# Patient Record
Sex: Female | Born: 1965 | Race: White | Hispanic: No | Smoking: Current some day smoker
Health system: Southern US, Community
[De-identification: ages and names within clinical notes are randomized; demographics above are authoritative.]

## PROBLEM LIST (undated history)

## (undated) DIAGNOSIS — K219 Gastro-esophageal reflux disease without esophagitis: Secondary | ICD-10-CM

## (undated) DIAGNOSIS — K760 Fatty (change of) liver, not elsewhere classified: Secondary | ICD-10-CM

## (undated) DIAGNOSIS — M1990 Unspecified osteoarthritis, unspecified site: Secondary | ICD-10-CM

## (undated) DIAGNOSIS — J449 Chronic obstructive pulmonary disease, unspecified: Secondary | ICD-10-CM

## (undated) DIAGNOSIS — I10 Essential (primary) hypertension: Secondary | ICD-10-CM

## (undated) DIAGNOSIS — K7460 Unspecified cirrhosis of liver: Secondary | ICD-10-CM

## (undated) HISTORY — DX: Unspecified osteoarthritis, unspecified site: M19.90

## (undated) HISTORY — PX: CHOLECYSTECTOMY: SHX55

## (undated) MED FILL — Medication: Fill #0 | Status: CN

---

## 1999-09-04 ENCOUNTER — Encounter: Payer: Self-pay | Admitting: Emergency Medicine

## 1999-09-04 ENCOUNTER — Emergency Department (HOSPITAL_COMMUNITY): Admission: EM | Admit: 1999-09-04 | Discharge: 1999-09-04 | Payer: Self-pay | Admitting: Emergency Medicine

## 2002-07-11 ENCOUNTER — Encounter: Payer: Self-pay | Admitting: Emergency Medicine

## 2002-07-11 ENCOUNTER — Inpatient Hospital Stay (HOSPITAL_COMMUNITY): Admission: EM | Admit: 2002-07-11 | Discharge: 2002-07-12 | Payer: Self-pay | Admitting: Emergency Medicine

## 2002-11-06 ENCOUNTER — Emergency Department (HOSPITAL_COMMUNITY): Admission: EM | Admit: 2002-11-06 | Discharge: 2002-11-07 | Payer: Self-pay | Admitting: Emergency Medicine

## 2002-11-06 ENCOUNTER — Encounter: Payer: Self-pay | Admitting: Emergency Medicine

## 2004-10-03 ENCOUNTER — Other Ambulatory Visit: Payer: Self-pay

## 2004-10-03 ENCOUNTER — Emergency Department: Payer: Self-pay | Admitting: Emergency Medicine

## 2008-12-04 ENCOUNTER — Emergency Department: Payer: Self-pay | Admitting: Internal Medicine

## 2010-06-14 ENCOUNTER — Emergency Department: Payer: Self-pay | Admitting: Unknown Physician Specialty

## 2011-04-14 ENCOUNTER — Emergency Department: Payer: Self-pay | Admitting: Emergency Medicine

## 2012-12-09 ENCOUNTER — Emergency Department: Payer: Self-pay | Admitting: Emergency Medicine

## 2013-03-09 DIAGNOSIS — B1920 Unspecified viral hepatitis C without hepatic coma: Secondary | ICD-10-CM | POA: Insufficient documentation

## 2013-12-29 ENCOUNTER — Emergency Department: Payer: Self-pay | Admitting: Emergency Medicine

## 2014-10-19 ENCOUNTER — Emergency Department
Admission: EM | Admit: 2014-10-19 | Discharge: 2014-10-19 | Disposition: A | Discharge status: Home / Self Care | Payer: Self-pay | Attending: Emergency Medicine | Admitting: Emergency Medicine

## 2014-10-19 ENCOUNTER — Encounter: Payer: Self-pay | Admitting: Emergency Medicine

## 2014-10-19 DIAGNOSIS — M5441 Lumbago with sciatica, right side: Secondary | ICD-10-CM | POA: Insufficient documentation

## 2014-10-19 DIAGNOSIS — R1031 Right lower quadrant pain: Secondary | ICD-10-CM | POA: Insufficient documentation

## 2014-10-19 DIAGNOSIS — Z720 Tobacco use: Secondary | ICD-10-CM | POA: Insufficient documentation

## 2014-10-19 DIAGNOSIS — M5431 Sciatica, right side: Secondary | ICD-10-CM

## 2014-10-19 MED ORDER — IBUPROFEN 800 MG PO TABS: 800.0000 mg | ORAL_TABLET | Freq: Three times a day (TID) | ORAL | Status: DC | PRN

## 2014-10-19 MED ORDER — CYCLOBENZAPRINE HCL 10 MG PO TABS: 10.0000 mg | ORAL_TABLET | Freq: Three times a day (TID) | ORAL | Status: DC | PRN

## 2014-10-19 MED ORDER — PREDNISONE 10 MG PO TABS: ORAL_TABLET | ORAL | Status: DC

## 2014-10-19 MED ORDER — OXYCODONE-ACETAMINOPHEN 5-325 MG PO TABS: 1.0000 | ORAL_TABLET | ORAL | Status: DC | PRN

## 2014-10-19 NOTE — ED Notes (Signed)
Lower back pain since last friday

## 2014-10-19 NOTE — ED Provider Notes (Signed)
[facility] Emergency Department Provider Note  ____________________________________________  Time seen: Approximately 1:02 PM  I have reviewed the triage vital signs and the nursing notes.   HISTORY  Chief Complaint Back Pain    HPI Monique Edwards is a 49 y.o. female presents for evaluation of right lower flank pain. Denies any urinary symptoms at this time. States that she does a lot of lifting and walking up and down ladders. States the pain started about 4 days ago.   History reviewed. No pertinent past medical history.  There are no active problems to display for this patient.   History reviewed. No pertinent past surgical history.  Current Outpatient Rx  Name  Route  Sig  Dispense  Refill  . cyclobenzaprine (FLEXERIL) 10 MG tablet   Oral   Take 1 tablet (10 mg total) by mouth every 8 (eight) hours as needed for muscle spasms.   30 tablet   1   . ibuprofen (ADVIL,MOTRIN) 800 MG tablet   Oral   Take 1 tablet (800 mg total) by mouth every 8 (eight) hours as needed.   30 tablet   0   . oxyCODONE-acetaminophen (ROXICET) 5-325 MG per tablet   Oral   Take 1-2 tablets by mouth every 4 (four) hours as needed for severe pain.   15 tablet   0   . predniSONE (DELTASONE) 10 MG tablet      Take 5 tablets daily x 5 days   25 tablet   0     Allergies Review of patient's allergies indicates not on file.  History reviewed. No pertinent family history.  Social History History  Substance Use Topics  . Smoking status: Current Some Day Smoker  . Smokeless tobacco: Not on file  . Alcohol Use: Yes     Comment: occassional    Review of Systems Constitutional: No fever/chills Eyes: No visual changes. ENT: No sore throat. Cardiovascular: Denies chest pain. Respiratory: Denies shortness of breath. Gastrointestinal: No abdominal pain.  No nausea, no vomiting.  No diarrhea.  No constipation. Genitourinary: Negative for dysuria. Musculoskeletal:  Positive for right lower flank pain. Skin: Negative for rash. Neurological: Negative for headaches, focal weakness or numbness.  10-point ROS otherwise negative.  ____________________________________________   PHYSICAL EXAM:  VITAL SIGNS: ED Triage Vitals  Enc Vitals Group     BP 10/19/14 1154 138/82 mmHg     Pulse Rate 10/19/14 1154 76     Resp 10/19/14 1154 18     Temp 10/19/14 1154 97.5 F (36.4 C)     Temp Source 10/19/14 1154 Oral     SpO2 10/19/14 1154 96 %     Weight 10/19/14 1154 260 lb (117.935 kg)     Height 10/19/14 1154 5\' 3"  (1.6 m)     Head Cir --      Peak Flow --      Pain Score 10/19/14 1200 7     Pain Loc --      Pain Edu? --      Excl. in GC? --     Constitutional: Alert and oriented. Well appearing and in no acute distress. Eyes: Conjunctivae are normal. PERRL. EOMI. Head: Atraumatic. Nose: No congestion/rhinnorhea. Mouth/Throat: Mucous membranes are moist.  Oropharynx non-erythematous. Neck: No stridor.   Cardiovascular: Normal rate, regular rhythm. Grossly normal heart sounds.  Good peripheral circulation. Respiratory: Normal respiratory effort.  No retractions. Lungs CTAB. Gastrointestinal: Soft and nontender. No distention. No abdominal bruits. No CVA tenderness. Musculoskeletal: Positive  right lower flank pain. Straight leg raise negative bilaterally. Neurologic:  Normal speech and language. No gross focal neurologic deficits are appreciated. Speech is normal. No gait instability. Skin:  Skin is warm, dry and intact. No rash noted. Psychiatric: Mood and affect are normal. Speech and behavior are normal.  ____________________________________________   LABS (all labs ordered are listed, but only abnormal results are displayed)  Labs Reviewed - No data to display ____________________________________________  EKG  Deferred  RADIOLOGY  Deferred ____________________________________________   PROCEDURES  Procedure(s) performed:  None  Critical Care performed: No  ____________________________________________   INITIAL IMPRESSION / ASSESSMENT AND PLAN / ED COURSE  Pertinent labs & imaging results that were available during my care of the patient were reviewed by me and considered in my medical decision making (see chart for details).  Diagnosed with acute lumbar strain. Rx given for Flexeril 10 mg 3 times a day and ibuprofen 800 mg 3 times a day. Patient also given prednisone 50 mg daily 5 days and to follow-up with her PCP as needed. She understands that she can return to the ER for any worsening symptomology.  Patient denies any other emergency medical complaint at this visit. ____________________________________________   FINAL CLINICAL IMPRESSION(S) / ED DIAGNOSES  Final diagnoses:  Sciatica associated with disorder of lumbar spine, right      Evangeline Dakin, PA-C 10/19/14 1321  Myrna Blazer, MD 10/19/14 1420

## 2014-10-19 NOTE — Discharge Instructions (Signed)
Lumbosacral Strain °Lumbosacral strain is a strain of any of the parts that make up your lumbosacral vertebrae. Your lumbosacral vertebrae are the bones that make up the lower third of your backbone. Your lumbosacral vertebrae are held together by muscles and tough, fibrous tissue (ligaments).  °CAUSES  °A sudden blow to your back can cause lumbosacral strain. Also, anything that causes an excessive stretch of the muscles in the low back can cause this strain. This is typically seen when people exert themselves strenuously, fall, lift heavy objects, bend, or crouch repeatedly. °RISK FACTORS °· Physically demanding work. °· Participation in pushing or pulling sports or sports that require a sudden twist of the back (tennis, golf, baseball). °· Weight lifting. °· Excessive lower back curvature. °· Forward-tilted pelvis. °· Weak back or abdominal muscles or both. °· Tight hamstrings. °SIGNS AND SYMPTOMS  °Lumbosacral strain may cause pain in the area of your injury or pain that moves (radiates) down your leg.  °DIAGNOSIS °Your health care provider can often diagnose lumbosacral strain through a physical exam. In some cases, you may need tests such as X-ray exams.  °TREATMENT  °Treatment for your lower back injury depends on many factors that your clinician will have to evaluate. However, most treatment will include the use of anti-inflammatory medicines. °HOME CARE INSTRUCTIONS  °· Avoid hard physical activities (tennis, racquetball, waterskiing) if you are not in proper physical condition for it. This may aggravate or create problems. °· If you have a back problem, avoid sports requiring sudden body movements. Swimming and walking are generally safer activities. °· Maintain good posture. °· Maintain a healthy weight. °· For acute conditions, you may put ice on the injured area. °· Put ice in a plastic bag. °· Place a towel between your skin and the bag. °· Leave the ice on for 20 minutes, 2-3 times a day. °· When the  low back starts healing, stretching and strengthening exercises may be recommended. °SEEK MEDICAL CARE IF: °· Your back pain is getting worse. °· You experience severe back pain not relieved with medicines. °SEEK IMMEDIATE MEDICAL CARE IF:  °· You have numbness, tingling, weakness, or problems with the use of your arms or legs. °· There is a change in bowel or bladder control. °· You have increasing pain in any area of the body, including your belly (abdomen). °· You notice shortness of breath, dizziness, or feel faint. °· You feel sick to your stomach (nauseous), are throwing up (vomiting), or become sweaty. °· You notice discoloration of your toes or legs, or your feet get very cold. °MAKE SURE YOU:  °· Understand these instructions. °· Will watch your condition. °· Will get help right away if you are not doing well or get worse. °Document Released: 01/31/2005 Document Revised: 04/28/2013 Document Reviewed: 12/10/2012 °ExitCare® Patient Information ©2015 [employer]. This information is not intended to replace advice given to you by your health care provider. Make sure you discuss any questions you have with your health care provider. ° °Sciatica °Sciatica is pain, weakness, numbness, or tingling along the path of the sciatic nerve. The nerve starts in the lower back and runs down the back of each leg. The nerve controls the muscles in the lower leg and in the back of the knee, while also providing sensation to the back of the thigh, lower leg, and the sole of your foot. Sciatica is a symptom of another medical condition. For instance, nerve damage or certain conditions, such as a herniated disk or   bone spur on the spine, pinch or put pressure on the sciatic nerve. This causes the pain, weakness, or other sensations normally associated with sciatica. Generally, sciatica only affects one side of the body. °CAUSES  °· Herniated or slipped disc. °· Degenerative disk disease. °· A pain disorder involving the narrow  muscle in the buttocks (piriformis syndrome). °· Pelvic injury or fracture. °· Pregnancy. °· Tumor (rare). °SYMPTOMS  °Symptoms can vary from mild to very severe. The symptoms usually travel from the low back to the buttocks and down the back of the leg. Symptoms can include: °· Mild tingling or dull aches in the lower back, leg, or hip. °· Numbness in the back of the calf or sole of the foot. °· Burning sensations in the lower back, leg, or hip. °· Sharp pains in the lower back, leg, or hip. °· Leg weakness. °· Severe back pain inhibiting movement. °These symptoms may get worse with coughing, sneezing, laughing, or prolonged sitting or standing. Also, being overweight may worsen symptoms. °DIAGNOSIS  °Your caregiver will perform a physical exam to look for common symptoms of sciatica. He or she may ask you to do certain movements or activities that would trigger sciatic nerve pain. Other tests may be performed to find the cause of the sciatica. These may include: °· Blood tests. °· X-rays. °· Imaging tests, such as an MRI or CT scan. °TREATMENT  °Treatment is directed at the cause of the sciatic pain. Sometimes, treatment is not necessary and the pain and discomfort goes away on its own. If treatment is needed, your caregiver may suggest: °· Over-the-counter medicines to relieve pain. °· Prescription medicines, such as anti-inflammatory medicine, muscle relaxants, or narcotics. °· Applying heat or ice to the painful area. °· Steroid injections to lessen pain, irritation, and inflammation around the nerve. °· Reducing activity during periods of pain. °· Exercising and stretching to strengthen your abdomen and improve flexibility of your spine. Your caregiver may suggest losing weight if the extra weight makes the back pain worse. °· Physical therapy. °· Surgery to eliminate what is pressing or pinching the nerve, such as a bone spur or part of a herniated disk. °HOME CARE INSTRUCTIONS  °· Only take over-the-counter  or prescription medicines for pain or discomfort as directed by your caregiver. °· Apply ice to the affected area for 20 minutes, 3-4 times a day for the first 48-72 hours. Then try heat in the same way. °· Exercise, stretch, or perform your usual activities if these do not aggravate your pain. °· Attend physical therapy sessions as directed by your caregiver. °· Keep all follow-up appointments as directed by your caregiver. °· Do not wear high heels or shoes that do not provide proper support. °· Check your mattress to see if it is too soft. A firm mattress may lessen your pain and discomfort. °SEEK IMMEDIATE MEDICAL CARE IF:  °· You lose control of your bowel or bladder (incontinence). °· You have increasing weakness in the lower back, pelvis, buttocks, or legs. °· You have redness or swelling of your back. °· You have a burning sensation when you urinate. °· You have pain that gets worse when you lie down or awakens you at night. °· Your pain is worse than you have experienced in the past. °· Your pain is lasting longer than 4 weeks. °· You are suddenly losing weight without reason. °MAKE SURE YOU: °· Understand these instructions. °· Will watch your condition. °· Will get help right away if   you are not doing well or get worse. °Document Released: 04/17/2001 Document Revised: 10/23/2011 Document Reviewed: 09/02/2011 °ExitCare® Patient Information ©2015 [employer]. This information is not intended to replace advice given to you by your health care provider. Make sure you discuss any questions you have with your health care provider. ° °

## 2014-10-19 NOTE — ED Notes (Signed)
Developed lower back last Friday..unsure of injury but states she is up and down ladders and does a lot of cleaning. Ambulates to room without limp.

## 2015-02-24 ENCOUNTER — Emergency Department
Admission: EM | Admit: 2015-02-24 | Discharge: 2015-02-24 | Disposition: A | Discharge status: Home / Self Care | Payer: Self-pay | Attending: Emergency Medicine | Admitting: Emergency Medicine

## 2015-02-24 ENCOUNTER — Encounter: Payer: Self-pay | Admitting: Emergency Medicine

## 2015-02-24 ENCOUNTER — Emergency Department: Payer: Self-pay

## 2015-02-24 DIAGNOSIS — R52 Pain, unspecified: Secondary | ICD-10-CM

## 2015-02-24 DIAGNOSIS — L089 Local infection of the skin and subcutaneous tissue, unspecified: Secondary | ICD-10-CM | POA: Insufficient documentation

## 2015-02-24 DIAGNOSIS — Z720 Tobacco use: Secondary | ICD-10-CM | POA: Insufficient documentation

## 2015-02-24 DIAGNOSIS — Z7952 Long term (current) use of systemic steroids: Secondary | ICD-10-CM | POA: Insufficient documentation

## 2015-02-24 DIAGNOSIS — B999 Unspecified infectious disease: Secondary | ICD-10-CM

## 2015-02-24 HISTORY — DX: Gastro-esophageal reflux disease without esophagitis: K21.9

## 2015-02-24 MED ORDER — OXYCODONE-ACETAMINOPHEN 5-325 MG PO TABS: 1.0000 | ORAL_TABLET | ORAL | Status: DC | PRN

## 2015-02-24 MED ORDER — SULFAMETHOXAZOLE-TRIMETHOPRIM 800-160 MG PO TABS: 1.0000 | ORAL_TABLET | Freq: Two times a day (BID) | ORAL | Status: DC

## 2015-02-24 MED ORDER — OXYCODONE-ACETAMINOPHEN 5-325 MG PO TABS
1.0000 | ORAL_TABLET | Freq: Once | ORAL | Status: AC
  Administered 2015-02-24: 1 via ORAL
  Filled 2015-02-24: qty 1

## 2015-02-24 NOTE — ED Notes (Addendum)
Redness and swelling noted to 5th digit. Pt has sore noted to left side of nail. No drainage noted at this time. Pt complains of pain.

## 2015-02-24 NOTE — ED Notes (Signed)
Pt to ed with c/o right hand fifth digit pain and swelling, redness x 2 days.

## 2015-02-24 NOTE — Discharge Instructions (Signed)
Cellulitis Cellulitis is an infection of the skin and the tissue under the skin. The infected area is usually red and tender. This happens most often in the arms and lower legs. HOME CARE   Take your antibiotic medicine as told. Finish the medicine even if you start to feel better.  Keep the infected arm or leg raised (elevated).  Put a warm cloth on the area up to 4 times per day.  Only take medicines as told by your doctor.  Keep all doctor visits as told. GET HELP IF:  You see red streaks on the skin coming from the infected area.  Your red area gets bigger or turns a dark color.  Your bone or joint under the infected area is painful after the skin heals.  Your infection comes back in the same area or different area.  You have a puffy (swollen) bump in the infected area.  You have new symptoms.  You have a fever. GET HELP RIGHT AWAY IF:   You feel very sleepy.  You throw up (vomit) or have watery poop (diarrhea).  You feel sick and have muscle aches and pains.   This information is not intended to replace advice given to you by your health care provider. Make sure you discuss any questions you have with your health care provider.   Document Released: 10/10/2007 Document Revised: 01/12/2015 Document Reviewed: 07/09/2011 Elsevier Interactive Patient Education 2016 [employer]   Warm compresses or soak her finger in warm water several times per day. Take all the antibiotics until finished. Follow-up with her family doctor or return to the emergency room if any severe worsening of your finger infection. Keep area clean and dry. Change dressing as needed. Percocet as needed for pain.

## 2015-02-24 NOTE — ED Provider Notes (Signed)
[facility] Emergency Department Provider Note  ____________________________________________  Time seen: Approximately 11:52 AM  I have reviewed the triage vital signs and the nursing notes.   HISTORY  Chief Complaint Hand Pain   HPI Monique Edwards is a 49 y.o. female to complain of right fifth digit pain and swelling for 2 days. Patient states that it became red and painful.Denies any fever or chills. Patient states that prior to this she had a hangnail on her fifth finger and pulled it off. 2 days ago she began having some discomfort in that area and then yesterday pain he came much more involved. Today she is here with extreme pain and redness. She rates her pain as a 10 out of 10 at present.   Past Medical History  Diagnosis Date  . GERD (gastroesophageal reflux disease)     There are no active problems to display for this patient.   History reviewed. No pertinent past surgical history.  Current Outpatient Rx  Name  Route  Sig  Dispense  Refill  . cyclobenzaprine (FLEXERIL) 10 MG tablet   Oral   Take 1 tablet (10 mg total) by mouth every 8 (eight) hours as needed for muscle spasms.   30 tablet   1   . ibuprofen (ADVIL,MOTRIN) 800 MG tablet   Oral   Take 1 tablet (800 mg total) by mouth every 8 (eight) hours as needed.   30 tablet   0   . oxyCODONE-acetaminophen (PERCOCET) 5-325 MG tablet   Oral   Take 1 tablet by mouth every 4 (four) hours as needed for severe pain.   20 tablet   0   . predniSONE (DELTASONE) 10 MG tablet      Take 5 tablets daily x 5 days   25 tablet   0   . sulfamethoxazole-trimethoprim (BACTRIM DS,SEPTRA DS) 800-160 MG tablet   Oral   Take 1 tablet by mouth 2 (two) times daily.   20 tablet   0     Allergies Review of patient's allergies indicates no known allergies.  History reviewed. No pertinent family history.  Social History Social History  Substance Use Topics  . Smoking status: Current Some Day  Smoker  . Smokeless tobacco: None  . Alcohol Use: Yes     Comment: occassional    Review of Systems Constitutional: No fever/chills Cardiovascular: Denies chest pain. Respiratory: Denies shortness of breath. Gastrointestinal:  No nausea, no vomiting.   Genitourinary: Negative for dysuria. Musculoskeletal: Negative for back pain. Positive right fifth finger pain Skin: Negative for rash. Redness right fifth finger. Neurological: Negative for headaches, focal weakness or numbness.  10-point ROS otherwise negative.  ____________________________________________   PHYSICAL EXAM:  VITAL SIGNS: ED Triage Vitals  Enc Vitals Group     BP 02/24/15 1045 180/73 mmHg     Pulse Rate 02/24/15 1045 74     Resp 02/24/15 1045 20     Temp 02/24/15 1045 98.2 F (36.8 C)     Temp Source 02/24/15 1045 Oral     SpO2 02/24/15 1045 98 %     Weight 02/24/15 1045 260 lb (117.935 kg)     Height 02/24/15 1045 5\' 3"  (1.6 m)     Head Cir --      Peak Flow --      Pain Score 02/24/15 1045 10     Pain Loc --      Pain Edu? --      Excl. in GC? --  Constitutional: Alert and oriented. Well appearing and in no acute distress. Eyes: Conjunctivae are normal. PERRL. EOMI. Head: Atraumatic. Nose: No congestion/rhinnorhea. Neck: No stridor.   Cardiovascular: Normal rate, regular rhythm. Grossly normal heart sounds.  Good peripheral circulation. Respiratory: Normal respiratory effort.  No retractions. Lungs CTAB. Gastrointestinal: Soft and nontender. No distention. No abdominal bruits. No CVA tenderness. Musculoskeletal: Moderate tenderness on palpation of the fifth digit  Neurologic:  Normal speech and language. No gross focal neurologic deficits are appreciated. No gait instability. Skin:  Skin is warm, dry. At the base of the nail lateral aspect there is a small scab-like lesion that was removed with purulent material removed. Psychiatric: Mood and affect are normal. Speech and behavior are  normal.  ____________________________________________   LABS (all labs ordered are listed, but only abnormal results are displayed)  Labs Reviewed - No data to display _RADIOLOGY  X-ray of the right fifth finger is negative, no evidence of osteomyelitis per radiologist. ____________________________________________   PROCEDURES  Procedure(s) performed: None  Critical Care performed: No  ____________________________________________   INITIAL IMPRESSION / ASSESSMENT AND PLAN / ED COURSE  Pertinent labs & imaging results that were available during my care of the patient were reviewed by me and considered in my medical decision making (see chart for details).  Patient was placed on Septra DS and Percocet as needed for pain. Patient is to follow-up with her PCP or return to the emergency room if any severe worsening of her infection. ____________________________________________   FINAL CLINICAL IMPRESSION(S) / ED DIAGNOSES  Final diagnoses:  Infection  Pain  Infection of skin of finger      Tommi Rumps, PA-C 02/24/15 1519  Emily Filbert, MD 02/24/15 1536

## 2015-03-08 ENCOUNTER — Ambulatory Visit: Payer: Self-pay

## 2015-11-10 ENCOUNTER — Ambulatory Visit: Payer: Self-pay

## 2015-11-24 ENCOUNTER — Emergency Department
Admission: EM | Admit: 2015-11-24 | Discharge: 2015-11-24 | Disposition: A | Discharge status: Home / Self Care | Payer: Self-pay | Attending: Emergency Medicine | Admitting: Emergency Medicine

## 2015-11-24 ENCOUNTER — Encounter: Payer: Self-pay | Admitting: Emergency Medicine

## 2015-11-24 ENCOUNTER — Emergency Department: Payer: Self-pay

## 2015-11-24 DIAGNOSIS — R1012 Left upper quadrant pain: Secondary | ICD-10-CM | POA: Insufficient documentation

## 2015-11-24 DIAGNOSIS — F17200 Nicotine dependence, unspecified, uncomplicated: Secondary | ICD-10-CM | POA: Insufficient documentation

## 2015-11-24 LAB — COMPREHENSIVE METABOLIC PANEL
ALT: 31 U/L (ref 14–54)
AST: 37 U/L (ref 15–41)
Albumin: 4 g/dL (ref 3.5–5.0)
Alkaline Phosphatase: 52 U/L (ref 38–126)
Anion gap: 8 (ref 5–15)
BILIRUBIN TOTAL: 1.2 mg/dL (ref 0.3–1.2)
BUN: 12 mg/dL (ref 6–20)
CALCIUM: 9 mg/dL (ref 8.9–10.3)
CHLORIDE: 102 mmol/L (ref 101–111)
CO2: 26 mmol/L (ref 22–32)
CREATININE: 0.72 mg/dL (ref 0.44–1.00)
GFR calc Af Amer: >60 mL/min (ref >60)
Glucose, Bld: 104 mg/dL — ABNORMAL HIGH (ref 65–99)
Potassium: 3.8 mmol/L (ref 3.5–5.1)
Sodium: 136 mmol/L (ref 135–145)
TOTAL PROTEIN: 8 g/dL (ref 6.5–8.1)

## 2015-11-24 LAB — URINALYSIS COMPLETE WITH MICROSCOPIC (ARMC ONLY)
BILIRUBIN URINE: NEGATIVE
GLUCOSE, UA: NEGATIVE mg/dL
Hgb urine dipstick: NEGATIVE
Ketones, ur: NEGATIVE mg/dL
Leukocytes, UA: NEGATIVE
Nitrite: NEGATIVE
PH: 6 (ref 5.0–8.0)
Protein, ur: NEGATIVE mg/dL
RBC / HPF: NONE SEEN RBC/hpf (ref 0–5)
Specific Gravity, Urine: 1.004 — ABNORMAL LOW (ref 1.005–1.030)

## 2015-11-24 LAB — CBC
HCT: 33.7 % — ABNORMAL LOW (ref 35.0–47.0)
HEMOGLOBIN: 11.7 g/dL — AB (ref 12.0–16.0)
MCH: 29.4 pg (ref 26.0–34.0)
MCHC: 34.7 g/dL (ref 32.0–36.0)
MCV: 84.8 fL (ref 80.0–100.0)
Platelets: 53 10*3/uL — ABNORMAL LOW (ref 150–440)
RBC: 3.97 MIL/uL (ref 3.80–5.20)
RDW: 14.3 % (ref 11.5–14.5)
WBC: 3.2 10*3/uL — AB (ref 3.6–11.0)

## 2015-11-24 LAB — LIPASE, BLOOD: Lipase: 28 U/L (ref 11–51)

## 2015-11-24 MED ORDER — MORPHINE SULFATE (PF) 4 MG/ML IV SOLN
4.0000 mg | Freq: Once | INTRAVENOUS | Status: AC
  Administered 2015-11-24: 4 mg via INTRAVENOUS
  Filled 2015-11-24: qty 1

## 2015-11-24 MED ORDER — GI COCKTAIL ~~LOC~~
30.0000 mL | Freq: Once | ORAL | Status: AC
  Administered 2015-11-24: 30 mL via ORAL

## 2015-11-24 MED ORDER — GI COCKTAIL ~~LOC~~
ORAL | Status: AC
  Administered 2015-11-24: 30 mL via ORAL
  Filled 2015-11-24: qty 30

## 2015-11-24 MED ORDER — DIATRIZOATE MEGLUMINE & SODIUM 66-10 % PO SOLN
15.0000 mL | Freq: Once | ORAL | Status: AC
  Administered 2015-11-24: 15 mL via ORAL

## 2015-11-24 MED ORDER — ONDANSETRON 4 MG PO TBDP: 4.0000 mg | ORAL_TABLET | Freq: Three times a day (TID) | ORAL | Status: DC | PRN

## 2015-11-24 MED ORDER — GI COCKTAIL ~~LOC~~: 30.0000 mL | Freq: Once | ORAL | Status: DC

## 2015-11-24 MED ORDER — IOPAMIDOL (ISOVUE-300) INJECTION 61%
100.0000 mL | Freq: Once | INTRAVENOUS | Status: AC | PRN
  Administered 2015-11-24: 100 mL via INTRAVENOUS

## 2015-11-24 MED ORDER — ONDANSETRON HCL 4 MG/2ML IJ SOLN
4.0000 mg | Freq: Once | INTRAMUSCULAR | Status: AC
Start: 2015-11-24 — End: 2015-11-24
  Administered 2015-11-24: 4 mg via INTRAVENOUS
  Filled 2015-11-24: qty 2

## 2015-11-24 MED ORDER — HYDROCODONE-ACETAMINOPHEN 5-325 MG PO TABS: 1.0000 | ORAL_TABLET | ORAL | Status: DC | PRN

## 2015-11-24 NOTE — ED Provider Notes (Signed)
[facility] Emergency Department Provider Note  Time seen: 2:06 PM  I have reviewed the triage vital signs and the nursing notes.   HISTORY  Chief Complaint Abdominal Pain    HPI Monique Edwards is a 50 y.o. female with a past medical history of gastric reflux who presents the emergency department left-sided abdominal pain. According to the patient for the past 3 days she has been expressing left-sided abdominal pain, worse over the past 24 hours. States somewhat worse pain after eating. Denies any dysuria, nausea, vomiting, diarrhea, fever. Describes the pain as dull aching moderate 5/10 pain currently.     Past Medical History  Diagnosis Date  . GERD (gastroesophageal reflux disease)     There are no active problems to display for this patient.   No past surgical history on file.  Current Outpatient Rx  Name  Route  Sig  Dispense  Refill  . cyclobenzaprine (FLEXERIL) 10 MG tablet   Oral   Take 1 tablet (10 mg total) by mouth every 8 (eight) hours as needed for muscle spasms.   30 tablet   1   . ibuprofen (ADVIL,MOTRIN) 800 MG tablet   Oral   Take 1 tablet (800 mg total) by mouth every 8 (eight) hours as needed.   30 tablet   0   . oxyCODONE-acetaminophen (PERCOCET) 5-325 MG tablet   Oral   Take 1 tablet by mouth every 4 (four) hours as needed for severe pain.   20 tablet   0   . predniSONE (DELTASONE) 10 MG tablet      Take 5 tablets daily x 5 days   25 tablet   0   . sulfamethoxazole-trimethoprim (BACTRIM DS,SEPTRA DS) 800-160 MG tablet   Oral   Take 1 tablet by mouth 2 (two) times daily.   20 tablet   0     Allergies Review of patient's allergies indicates no known allergies.  No family history on file.  Social History Social History  Substance Use Topics  . Smoking status: Current Some Day Smoker  . Smokeless tobacco: None  . Alcohol Use: Yes     Comment: occassional    Review of Systems Constitutional: Negative  for fever. Cardiovascular: Negative for chest pain. Respiratory: Negative for shortness of breath. Gastrointestinal: Left-sided abdominal pain. Negative for nausea, vomiting, diarrhea Genitourinary: Negative for dysuria. Musculoskeletal: Negative for back pain Neurological: Negative for headache 10-point ROS otherwise negative.  ____________________________________________   PHYSICAL EXAM:  VITAL SIGNS: ED Triage Vitals  Enc Vitals Group     BP 11/24/15 1109 159/84 mmHg     Pulse Rate 11/24/15 1109 78     Resp 11/24/15 1109 18     Temp 11/24/15 1109 98.1 F (36.7 C)     Temp Source 11/24/15 1109 Oral     SpO2 11/24/15 1109 95 %     Weight --      Height --      Head Cir --      Peak Flow --      Pain Score 11/24/15 1110 4     Pain Loc --      Pain Edu? --      Excl. in GC? --     Constitutional: Alert and oriented. Well appearing and in no distress. Eyes: Normal exam ENT   Head: Normocephalic and atraumatic   Mouth/Throat: Mucous membranes are moist. Cardiovascular: Normal rate, regular rhythm. No murmur Respiratory: Normal respiratory effort without tachypnea nor retractions. Breath  sounds are clear  Gastrointestinal: Soft, mild left upper quadrant tenderness palpation, no rebound or guarding. No CVA tenderness. Musculoskeletal: Nontender with normal range of motion in all extremities.  Neurologic:  Normal speech and language. No gross focal neurologic deficits  Skin:  Skin is warm, dry and intact.  Psychiatric: Mood and affect are normal.  ____________________________________________      RADIOLOGY  CT pending  ____________________________________________    INITIAL IMPRESSION / ASSESSMENT AND PLAN / ED COURSE  Pertinent labs & imaging results that were available during my care of the patient were reviewed by me and considered in my medical decision making (see chart for details).  The patient presents the emergency department with left-sided  abdominal pain for the past 3 days, worse over the past 24 hours. States moderate pain currently. Patient's labs are largely within normal limits including LFTs and lipase. We will obtain a CT scan the patient's abdomen/pelvis to further evaluate.  Labs are largely within normal limits. Urinalysis normal. Given the patient's left-sided abdominal discomfort we'll proceed with a CT abdomen/pelvis to rule out colitis/diverticulitis. If negative we will have the patient follow-up with her primary care doctor.  Patient care signed out to Dr. Alphonzo LemmingsMcShane CT pending.  ____________________________________________   FINAL CLINICAL IMPRESSION(S) / ED DIAGNOSES  Left-sided abdominal pain   Minna AntisKevin Lillyahna Hemberger, MD 11/24/15 [phone]

## 2015-11-24 NOTE — ED Provider Notes (Signed)
-----------------------------------------   5:29 PM on 11/24/2015 -----------------------------------------   Patient with a history of reflux disease and chronic epigastric bowel pain as well as "liver disease" presents today with epigastric reducible abdominal pain. CT and blood work are reassuring evidence of ischemia. Patient feels much better. She is requesting discharge. Troponin was negative despite 3 days of symptoms. I do not see utility and recurrent troponin at this time. Patient would prefer to go home and have admission or further observation which I do not think is unreasonable. We will discharge her if she tolerates by mouth.  Monique PlantJames A Jerilyn Gillaspie, MD 11/24/15 1728  Monique PlantJames A Sharice Harriss, MD 11/24/15 [phone]

## 2015-11-24 NOTE — Discharge Instructions (Signed)

## 2015-11-24 NOTE — ED Notes (Signed)
MD at bedside. 

## 2015-11-24 NOTE — ED Notes (Signed)
Pt presents with abd for a couple of days. Pt with hx of liver disease.

## 2015-12-13 ENCOUNTER — Ambulatory Visit: Payer: Self-pay | Admitting: Nurse Practitioner

## 2015-12-13 VITALS — BP 127/82 | Temp 98.4°F | Ht 64.0 in | Wt 256.0 lb

## 2015-12-13 DIAGNOSIS — M159 Polyosteoarthritis, unspecified: Secondary | ICD-10-CM

## 2015-12-13 DIAGNOSIS — M150 Primary generalized (osteo)arthritis: Principal | ICD-10-CM

## 2015-12-13 MED ORDER — OMEPRAZOLE 20 MG PO CPDR: 20.0000 mg | DELAYED_RELEASE_CAPSULE | Freq: Every day | ORAL | 3 refills | Status: DC

## 2015-12-13 MED ORDER — MELOXICAM 7.5 MG PO TABS: 7.5000 mg | ORAL_TABLET | Freq: Every day | ORAL | Status: DC

## 2015-12-13 MED ORDER — LACTULOSE 10 GM/15ML PO SOLN: 10.0000 g | Freq: Two times a day (BID) | ORAL | 4 refills | Status: DC | PRN

## 2015-12-13 NOTE — Progress Notes (Signed)
WAS IN HOSPITAL IN July FOR STOMACH PROBLEMS, MAIN ISSUE   PT'S UNDERSTANDING WAS OF CONSTIPATION  HISTORY OF HEPATITIS TREATED  SEES HEPATOLOGY RE: FATTY LIVER DISEASE EACH YEAR;   CHRONIC CONSTIPATION  KNEE PAIN, R/T ARTHRITIS       ASSESSMENT/PLAN:  WILL IMPLEMENT MELOXICAM 7.5 MG ONE TO TWO TIMES PER DAY FOR KNEE PAIN, TAKE WITH FOOD  WILL IMPLEMENT LACTULOSE FOR CONSTIPATION  WILL CONTINUE TO FOLLOW UP WITH HEPATOLOGIST FOR FATTY LIVER DISEASE  WILL CONTINUE OMEPRAZOLE FOR HER GERD  WILL HAVE PT FOLLOW UP WHEN HUSBAND RETURNS TO ASSESS RESPONSE TO MED CHANGES.

## 2015-12-15 ENCOUNTER — Other Ambulatory Visit: Payer: Self-pay | Admitting: Nurse Practitioner

## 2015-12-15 MED ORDER — IBUPROFEN 800 MG PO TABS: 800.0000 mg | ORAL_TABLET | Freq: Three times a day (TID) | ORAL | 0 refills | Status: DC | PRN

## 2015-12-28 ENCOUNTER — Ambulatory Visit: Payer: Self-pay | Admitting: Ophthalmology

## 2015-12-29 ENCOUNTER — Ambulatory Visit: Payer: Self-pay

## 2016-01-11 ENCOUNTER — Ambulatory Visit: Payer: Self-pay | Admitting: Ophthalmology

## 2016-01-25 ENCOUNTER — Ambulatory Visit: Payer: Self-pay | Admitting: Ophthalmology

## 2016-02-02 ENCOUNTER — Ambulatory Visit: Payer: Self-pay

## 2016-02-14 ENCOUNTER — Ambulatory Visit: Payer: Self-pay

## 2016-02-16 ENCOUNTER — Ambulatory Visit: Payer: Self-pay | Admitting: Ophthalmology

## 2016-02-16 ENCOUNTER — Telehealth: Payer: Self-pay | Admitting: Nurse Practitioner

## 2016-02-16 NOTE — Telephone Encounter (Signed)
Monique Edwards called to reschedule her eye appointment.

## 2016-02-16 NOTE — Telephone Encounter (Signed)
I left Clydie BraunKaren a voicemail to have her call us back to reschedule her eye appointment.

## 2016-02-17 ENCOUNTER — Telehealth: Payer: Self-pay

## 2016-02-17 NOTE — Telephone Encounter (Signed)
Called pt to give appt details for [facility]. Pt verbalized understanding and said she will be there.

## 2016-02-23 ENCOUNTER — Ambulatory Visit: Payer: Self-pay

## 2016-03-08 ENCOUNTER — Ambulatory Visit: Payer: Self-pay

## 2016-03-22 ENCOUNTER — Ambulatory Visit: Payer: Self-pay

## 2016-04-12 ENCOUNTER — Ambulatory Visit: Payer: Self-pay

## 2016-04-12 ENCOUNTER — Telehealth: Payer: Self-pay | Admitting: Nurse Practitioner

## 2016-04-12 NOTE — Telephone Encounter (Signed)
Patient called back to schedule appointment.

## 2016-04-12 NOTE — Telephone Encounter (Signed)
Patient called to schedule appointment.

## 2016-04-19 ENCOUNTER — Ambulatory Visit: Payer: Self-pay

## 2016-04-26 ENCOUNTER — Ambulatory Visit: Payer: Self-pay

## 2016-05-18 ENCOUNTER — Encounter: Payer: Self-pay | Admitting: Emergency Medicine

## 2016-05-18 ENCOUNTER — Emergency Department
Admission: EM | Admit: 2016-05-18 | Discharge: 2016-05-18 | Disposition: A | Discharge status: Home / Self Care | Payer: Self-pay | Attending: Emergency Medicine | Admitting: Emergency Medicine

## 2016-05-18 DIAGNOSIS — Z87891 Personal history of nicotine dependence: Secondary | ICD-10-CM | POA: Insufficient documentation

## 2016-05-18 DIAGNOSIS — A084 Viral intestinal infection, unspecified: Secondary | ICD-10-CM | POA: Insufficient documentation

## 2016-05-18 HISTORY — DX: Fatty (change of) liver, not elsewhere classified: K76.0

## 2016-05-18 LAB — CBC
HCT: 39.1 % (ref 35.0–47.0)
Hemoglobin: 13.4 g/dL (ref 12.0–16.0)
MCH: 29.8 pg (ref 26.0–34.0)
MCHC: 34.4 g/dL (ref 32.0–36.0)
MCV: 86.7 fL (ref 80.0–100.0)
Platelets: 76 10*3/uL — ABNORMAL LOW (ref 150–440)
RBC: 4.51 MIL/uL (ref 3.80–5.20)
RDW: 14.1 % (ref 11.5–14.5)
WBC: 6.3 10*3/uL (ref 3.6–11.0)

## 2016-05-18 LAB — COMPREHENSIVE METABOLIC PANEL
ALBUMIN: 4.3 g/dL (ref 3.5–5.0)
ALK PHOS: 55 U/L (ref 38–126)
ALT: 27 U/L (ref 14–54)
AST: 32 U/L (ref 15–41)
Anion gap: 9 (ref 5–15)
BILIRUBIN TOTAL: 0.5 mg/dL (ref 0.3–1.2)
BUN: 12 mg/dL (ref 6–20)
CALCIUM: 9.5 mg/dL (ref 8.9–10.3)
CO2: 25 mmol/L (ref 22–32)
Chloride: 102 mmol/L (ref 101–111)
Creatinine, Ser: 0.84 mg/dL (ref 0.44–1.00)
GFR calc Af Amer: >60 mL/min (ref >60)
GFR calc non Af Amer: >60 mL/min (ref >60)
GLUCOSE: 123 mg/dL — AB (ref 65–99)
Potassium: 3.6 mmol/L (ref 3.5–5.1)
Sodium: 136 mmol/L (ref 135–145)
Total Protein: 8.8 g/dL — ABNORMAL HIGH (ref 6.5–8.1)

## 2016-05-18 LAB — LIPASE, BLOOD: Lipase: 19 U/L (ref 11–51)

## 2016-05-18 MED ORDER — ONDANSETRON 4 MG PO TBDP: 4.0000 mg | ORAL_TABLET | Freq: Three times a day (TID) | ORAL | 0 refills | Status: DC | PRN

## 2016-05-18 MED ORDER — ONDANSETRON 4 MG PO TBDP
4.0000 mg | ORAL_TABLET | Freq: Once | ORAL | Status: AC
  Administered 2016-05-18: 4 mg via ORAL

## 2016-05-18 MED ORDER — ONDANSETRON 4 MG PO TBDP
ORAL_TABLET | ORAL | Status: AC
  Administered 2016-05-18: 4 mg via ORAL
  Filled 2016-05-18: qty 1

## 2016-05-18 NOTE — ED Triage Notes (Signed)
Pt ambulatory to triage in NAD, reports n/v/d, chills, and HA since last night.

## 2016-05-18 NOTE — ED Provider Notes (Signed)
[facility] Emergency Department Provider Note   ____________________________________________    I have reviewed the triage vital signs and the nursing notes.   HISTORY  Chief Complaint Emesis; Diarrhea; and Headache     HPI Monique Edwards is a 51 y.o. female who presents with complaints of nausea vomiting and diarrhea. Patient reports she felt well until about 8 PM yesterday at which time she started feeling "queasy". She then developed nausea vomiting and abdominal cramping. Later in the evening she developed diarrhea as well. She reports body aches in her back and upper legs. She does report some chills. She denies sick contacts. No recent travel.   Past Medical History:  Diagnosis Date  . Fatty liver   . GERD (gastroesophageal reflux disease)     There are no active problems to display for this patient.   History reviewed. No pertinent surgical history.  Prior to Admission medications   Medication Sig Start Date End Date Taking? Authorizing Provider  ibuprofen (ADVIL,MOTRIN) 800 MG tablet Take 1 tablet (800 mg total) by mouth every 8 (eight) hours as needed. Patient not taking: Reported on 12/13/2015 10/19/14   Charmayne Sheer Beers, PA-C  ibuprofen (ADVIL,MOTRIN) 800 MG tablet Take 1 tablet (800 mg total) by mouth every 8 (eight) hours as needed. 12/15/15   Zachery Dauer, FNP  lactulose (CHRONULAC) 10 GM/15ML solution Take 15 mLs (10 g total) by mouth 2 (two) times daily as needed for mild constipation. 12/13/15   Zachery Dauer, FNP  omeprazole (PRILOSEC) 20 MG capsule Take 1 capsule (20 mg total) by mouth daily. 12/13/15   Zachery Dauer, FNP     Allergies Patient has no known allergies.  History reviewed. No pertinent family history.  Social History Social History  Substance Use Topics  . Smoking status: Former Games developer  . Smokeless tobacco: Never Used  . Alcohol use No     Comment: former occassional    Review of Systems  Constitutional: No  dizziness Eyes: No visual changes.   Cardiovascular: Denies chest pain. Respiratory: Denies shortness of breath. Gastrointestinal: As above Genitourinary: Negative for dysuria. Musculoskeletal: Myalgias as above Skin: Negative for rash. Neurological: Negative for weakness  10-point ROS otherwise negative.  ____________________________________________   PHYSICAL EXAM:  VITAL SIGNS: ED Triage Vitals [05/18/16 1800]  Enc Vitals Group     BP (!) 147/84     Pulse Rate 91     Resp 20     Temp 99 F (37.2 C)     Temp Source Oral     SpO2 94 %     Weight 265 lb (120.2 kg)     Height 5\' 4"  (1.626 m)     Head Circumference      Peak Flow      Pain Score 4     Pain Loc      Pain Edu?      Excl. in GC?     Constitutional: Alert and oriented. No acute distress. Pleasant and interactive Eyes: Conjunctivae are normal.    Mouth/Throat: Mucous membranes are moist.    Cardiovascular: Normal rate, regular rhythm. Grossly normal heart sounds.  Good peripheral circulation. Respiratory: Normal respiratory effort.  No retractions. Lungs CTAB. Gastrointestinal: Soft and nontender. No distention.  No CVA tenderness. Genitourinary: deferred Musculoskeletal: No lower extremity tenderness nor edema.  Warm and well perfused Neurologic:  Normal speech and language. No gross focal neurologic deficits are appreciated.  Skin:  Skin is warm, dry and  intact. No rash noted. Psychiatric: Mood and affect are normal. Speech and behavior are normal.  ____________________________________________   LABS (all labs ordered are listed, but only abnormal results are displayed)  Labs Reviewed  COMPREHENSIVE METABOLIC PANEL - Abnormal; Notable for the following:       Result Value   Glucose, Bld 123 (*)    Total Protein 8.8 (*)    All other components within normal limits  CBC - Abnormal; Notable for the following:    Platelets 76 (*)    All other components within normal limits  LIPASE, BLOOD    URINALYSIS, COMPLETE (UACMP) WITH MICROSCOPIC   ____________________________________________  EKG  None ____________________________________________  RADIOLOGY  None ____________________________________________   PROCEDURES  Procedure(s) performed: No    Critical Care performed: No ____________________________________________   INITIAL IMPRESSION / ASSESSMENT AND PLAN / ED COURSE  Pertinent labs & imaging results that were available during my care of the patient were reviewed by me and considered in my medical decision making (see chart for details).  Patient well-appearing and in no acute distress. Abdominal exam is benign. Symptoms are most consistent with viral gastroenteritis which is extremely common at this time. Lab work is reassuring. Recommend supportive care with Zofran  Clinical Course    ____________________________________________   FINAL CLINICAL IMPRESSION(S) / ED DIAGNOSES  Final diagnoses:  Viral gastroenteritis      NEW MEDICATIONS STARTED DURING THIS VISIT:  New Prescriptions   No medications on file     Note:  This document was prepared using Dragon voice recognition software and may include unintentional dictation errors.    Jene Everyobert Hermione Havlicek, MD 05/18/16 2012

## 2016-05-18 NOTE — ED Notes (Signed)
Pt verbalizes understanding of discharge instructions.

## 2016-07-19 ENCOUNTER — Ambulatory Visit: Payer: Self-pay | Admitting: Adult Health Nurse Practitioner

## 2016-07-19 VITALS — BP 156/84 | HR 64 | Temp 97.9°F | Wt 265.3 lb

## 2016-07-19 DIAGNOSIS — W108XXD Fall (on) (from) other stairs and steps, subsequent encounter: Secondary | ICD-10-CM | POA: Insufficient documentation

## 2016-07-19 DIAGNOSIS — Z0000 Encounter for general adult medical examination without abnormal findings: Secondary | ICD-10-CM

## 2016-07-19 MED ORDER — MELOXICAM 7.5 MG PO TABS: 7.5000 mg | ORAL_TABLET | Freq: Every day | ORAL | 0 refills | Status: DC

## 2016-07-19 MED ORDER — CYCLOBENZAPRINE HCL 5 MG PO TABS: 5.0000 mg | ORAL_TABLET | Freq: Every day | ORAL | 0 refills | Status: DC

## 2016-07-19 NOTE — Progress Notes (Signed)
  Patient: Monique Edwards Female    DOB: 10-11-1965   51 y.o.   MRN: [id number] Visit Date: 07/19/2016  Today's Provider: Jacelyn Pieah Doles-Johnson, NP   Chief Complaint  Patient presents with  . Fall  . Pain   Subjective:    HPI   Pt states she fell down the stairs on Saturday night. CT head/c-spine negative.  EKG NSR.  Pt states she received no pain medications at the hospital.  Pt states she has pain in her back between her shoulder blades, R arm and L hip.  Pt reports limited ROM of the R arm. Taking ibuprofen/tylenol with not much relief.  Pain 10/10.      No Known Allergies Previous Medications   IBUPROFEN (ADVIL,MOTRIN) 800 MG TABLET    Take 1 tablet (800 mg total) by mouth every 8 (eight) hours as needed.   IBUPROFEN (ADVIL,MOTRIN) 800 MG TABLET    Take 1 tablet (800 mg total) by mouth every 8 (eight) hours as needed.   LACTULOSE (CHRONULAC) 10 GM/15ML SOLUTION    Take 15 mLs (10 g total) by mouth 2 (two) times daily as needed for mild constipation.   OMEPRAZOLE (PRILOSEC) 20 MG CAPSULE    Take 1 capsule (20 mg total) by mouth daily.   ONDANSETRON (ZOFRAN ODT) 4 MG DISINTEGRATING TABLET    Take 1 tablet (4 mg total) by mouth every 8 (eight) hours as needed for nausea or vomiting.    Review of Systems  All other systems reviewed and are negative.   Social History  Substance Use Topics  . Smoking status: Former Games developermoker  . Smokeless tobacco: Never Used  . Alcohol use No     Comment: former occassional   Objective:   BP (!) 156/84   Pulse 64   Temp 97.9 F (36.6 C)   Wt 265 lb 4.8 oz (120.3 kg)   BMI 45.54 kg/m   Physical Exam  Constitutional: She is oriented to person, place, and time. She appears well-developed and well-nourished.  Cardiovascular: Normal rate and regular rhythm.   Pulmonary/Chest: Effort normal and breath sounds normal.  Musculoskeletal:       Right shoulder: She exhibits decreased range of motion and pain. She exhibits no tenderness, no  swelling, no crepitus, normal pulse and normal strength.       Lumbar back: She exhibits normal range of motion and no tenderness.  Neurological: She is alert and oriented to person, place, and time.  Skin: Skin is warm and dry.  Vitals reviewed.       Assessment & Plan:          Back/arm/hip pain:  Muscle relaxant at night x 14 days.  Meloxicam 7.5 mg daily x 14 days.  Back neck exercises.  Heat/ice in rotation.  Topical OTC medications to area.   Fu in 4 weeks for BP check.  Routine labs for next week.    Jacelyn Pieah Doles-Johnson, NP   [facility]

## 2016-07-26 ENCOUNTER — Other Ambulatory Visit: Payer: Self-pay

## 2016-08-16 ENCOUNTER — Ambulatory Visit: Payer: Self-pay

## 2016-08-28 ENCOUNTER — Ambulatory Visit: Payer: Self-pay

## 2016-10-04 ENCOUNTER — Ambulatory Visit: Payer: Self-pay

## 2016-10-16 ENCOUNTER — Ambulatory Visit: Payer: Self-pay | Admitting: Family Medicine

## 2016-10-16 DIAGNOSIS — M62830 Muscle spasm of back: Secondary | ICD-10-CM | POA: Insufficient documentation

## 2016-10-16 DIAGNOSIS — M79674 Pain in right toe(s): Secondary | ICD-10-CM

## 2016-10-16 DIAGNOSIS — G8929 Other chronic pain: Secondary | ICD-10-CM

## 2016-10-16 DIAGNOSIS — M25561 Pain in right knee: Secondary | ICD-10-CM

## 2016-10-16 DIAGNOSIS — M546 Pain in thoracic spine: Secondary | ICD-10-CM

## 2016-10-16 DIAGNOSIS — M25562 Pain in left knee: Secondary | ICD-10-CM | POA: Insufficient documentation

## 2016-10-16 DIAGNOSIS — I10 Essential (primary) hypertension: Secondary | ICD-10-CM | POA: Insufficient documentation

## 2016-10-16 DIAGNOSIS — K219 Gastro-esophageal reflux disease without esophagitis: Secondary | ICD-10-CM

## 2016-10-16 MED ORDER — AMLODIPINE BESYLATE 5 MG PO TABS: 5.0000 mg | ORAL_TABLET | Freq: Every day | ORAL | 0 refills | Status: DC

## 2016-10-16 MED ORDER — IBUPROFEN 600 MG PO TABS: 600.0000 mg | ORAL_TABLET | Freq: Three times a day (TID) | ORAL | 0 refills | Status: DC | PRN

## 2016-10-16 MED ORDER — OMEPRAZOLE 20 MG PO CPDR: 20.0000 mg | DELAYED_RELEASE_CAPSULE | Freq: Every day | ORAL | 3 refills | Status: DC

## 2016-10-16 NOTE — Assessment & Plan Note (Signed)
xray

## 2016-10-16 NOTE — Progress Notes (Signed)
BP (!) 165/85 (BP Location: Left Arm, Patient Position: Sitting, Cuff Size: Large)   Pulse 70   Temp 98.1 F (36.7 C)   Ht 5\' 4"  (1.626 m)   Wt 263 lb 9.6 oz (119.6 kg)   BMI 45.25 kg/m    Subjective:    Patient ID: Monique Edwards, female    DOB: March 30, 1966, 51 y.o.   MRN: [id number]  HPI: Monique Edwards is a 51 y.o. female  Chief Complaint  Patient presents with  . Follow-up  . Gastroesophageal Reflux  . Toe Pain  . Knee Pain   HPI BP is high tonight; having headaches;  Having acid reflux; out of PPI Rx, knows to avoid certain foods No red flags, no abd pain, no blood in stool Left knee pain, arthritis Fell down the stairs a few months back Mother and father both had HTN Hardly ever eats salt  Relevant past medical, surgical, family and social history reviewed Past Medical History:  Diagnosis Date  . Fatty liver   . GERD (gastroesophageal reflux disease)    No past surgical history on file. No family history on file. Social History   Social History  . Marital status: Single    Spouse name: N/A  . Number of children: N/A  . Years of education: N/A   Occupational History  . Not on file.   Social History Main Topics  . Smoking status: Former Games developermoker  . Smokeless tobacco: Never Used  . Alcohol use No     Comment: former occassional  . Drug use: No  . Sexual activity: Not on file   Other Topics Concern  . Not on file   Social History Narrative  . No narrative on file    Interim medical history since last visit reviewed. Allergies and medications reviewed  Review of Systems Per HPI unless specifically indicated above     Objective:    BP (!) 165/85 (BP Location: Left Arm, Patient Position: Sitting, Cuff Size: Large)   Pulse 70   Temp 98.1 F (36.7 C)   Ht 5\' 4"  (1.626 m)   Wt 263 lb 9.6 oz (119.6 kg)   BMI 45.25 kg/m   Wt Readings from Last 3 Encounters:  10/16/16 263 lb 9.6 oz (119.6 kg)  07/19/16 265 lb 4.8 oz (120.3 kg)  05/18/16 265 lb  (120.2 kg)    Physical Exam  Constitutional: She appears well-developed and well-nourished. No distress.  Morbidly obese  HENT:  Head: Normocephalic and atraumatic.  Eyes: EOM are normal. No scleral icterus.  Neck: No thyromegaly present.  Cardiovascular: Normal rate, regular rhythm and normal heart sounds.   No murmur heard. Pulmonary/Chest: Effort normal and breath sounds normal. No respiratory distress. She has no wheezes.  Abdominal: Soft. Bowel sounds are normal. She exhibits no distension.  Musculoskeletal: Normal range of motion. She exhibits no edema.  Neurological: She is alert. She exhibits normal muscle tone.  Skin: Skin is warm and dry. She is not diaphoretic. No pallor.  Psychiatric: She has a normal mood and affect. Her behavior is normal. Judgment and thought content normal.   Results for orders placed or performed during the hospital encounter of 05/18/16  Lipase, blood  Result Value Ref Range   Lipase 19 11 - 51 U/L  Comprehensive metabolic panel  Result Value Ref Range   Sodium 136 135 - 145 mmol/L   Potassium 3.6 3.5 - 5.1 mmol/L   Chloride 102 101 - 111 mmol/L   CO2 25 22 -  32 mmol/L   Glucose, Bld 123 (H) 65 - 99 mg/dL   BUN 12 6 - 20 mg/dL   Creatinine, Ser 1.61 0.44 - 1.00 mg/dL   Calcium 9.5 8.9 - 09.6 mg/dL   Total Protein 8.8 (H) 6.5 - 8.1 g/dL   Albumin 4.3 3.5 - 5.0 g/dL   AST 32 15 - 41 U/L   ALT 27 14 - 54 U/L   Alkaline Phosphatase 55 38 - 126 U/L   Total Bilirubin 0.5 0.3 - 1.2 mg/dL   GFR calc non Af Amer >60 >60 mL/min   GFR calc Af Amer >60 >60 mL/min   Anion gap 9 5 - 15  CBC  Result Value Ref Range   WBC 6.3 3.6 - 11.0 K/uL   RBC 4.51 3.80 - 5.20 MIL/uL   Hemoglobin 13.4 12.0 - 16.0 g/dL   HCT 04.5 40.9 - 81.1 %   MCV 86.7 80.0 - 100.0 fL   MCH 29.8 26.0 - 34.0 pg   MCHC 34.4 32.0 - 36.0 g/dL   RDW 91.4 78.2 - 95.6 %   Platelets 76 (L) 150 - 440 K/uL      Assessment & Plan:   Problem List Items Addressed This Visit       Cardiovascular and Mediastinum   Hypertension    Try the DASH guidelines; start low-dose medicine, return 2-3 weeks recheck      Relevant Medications   amLODipine (NORVASC) 5 MG tablet     Digestive   GERD without esophagitis    Advised that NSAIDs can worsen this condition; avoid certain triggers; discussed risks of PPI        Other   Toe pain, right    xray      Relevant Orders   DG Toe Great Right   Thoracic spine pain    xray      Relevant Medications   ibuprofen (ADVIL,MOTRIN) 600 MG tablet   Other Relevant Orders   DG Thoracic Spine W/Swimmers   Morbid obesity (HCC)    Encouraged weight loss      Chronic pain of right knee    xray      Relevant Orders   DG Knee 3 Views Right      Follow up plan: Return in about 3 weeks (around 11/06/2016) for follow-up of blood pressure.  An after-visit summary was printed and given to the patient at check-out.  Please see the patient instructions which may contain other information and recommendations beyond what is mentioned above in the assessment and plan.  Meds ordered this encounter  Medications  . DISCONTD: omeprazole (PRILOSEC) 20 MG capsule    Sig: Take 1 capsule (20 mg total) by mouth daily.    Dispense:  30 capsule    Refill:  3  . amLODipine (NORVASC) 5 MG tablet    Sig: Take 1 tablet (5 mg total) by mouth daily.    Dispense:  30 tablet    Refill:  0  . ibuprofen (ADVIL,MOTRIN) 600 MG tablet    Sig: Take 1 tablet (600 mg total) by mouth every 8 (eight) hours as needed.    Dispense:  30 tablet    Refill:  0    Orders Placed This Encounter  Procedures  . DG Toe Great Right  . DG Knee 3 Views Right  . DG Thoracic Spine W/Swimmers    Medications Discontinued During This Encounter  Medication Reason  . cyclobenzaprine (FLEXERIL) 5 MG tablet Discontinued by provider  .  ondansetron (ZOFRAN ODT) 4 MG disintegrating tablet Discontinued by provider  . meloxicam (MOBIC) 7.5 MG tablet Discontinued by  provider  . ibuprofen (ADVIL,MOTRIN) 800 MG tablet Dose change  . ibuprofen (ADVIL,MOTRIN) 800 MG tablet Duplicate  . omeprazole (PRILOSEC) 20 MG capsule Reorder

## 2016-10-16 NOTE — Patient Instructions (Addendum)
We'll get xrays of your right knee, right great toe, and mid-back Staff can help you navigate the process Caution: prolonged use of proton pump inhibitors like omeprazole (Prilosec), pantoprazole (Protonix), esomeprazole (Nexium), and others like Dexilant and Aciphex may increase your risk of pneumonia, Clostridium difficile colitis, osteoporosis, anemia and other health complications Try to limit or avoid triggers like coffee, caffeinated beverages, onions, chocolate, spicy foods, peppermint, acid foods like pizza, spaghetti sauce, and orange juice Lose weight if you are overweight or obese Try elevating the head of your bed by placing a small wedge between your mattress and box springs to keep acid in the stomach at night instead of coming up into your esophagus  DASH Eating Plan DASH stands for "Dietary Approaches to Stop Hypertension." The DASH eating plan is a healthy eating plan that has been shown to reduce high blood pressure (hypertension). It may also reduce your risk for type 2 diabetes, heart disease, and stroke. The DASH eating plan may also help with weight loss. What are tips for following this plan? General guidelines  Avoid eating more than 2,300 mg (milligrams) of salt (sodium) a day. If you have hypertension, you may need to reduce your sodium intake to 1,500 mg a day.  Limit alcohol intake to no more than 1 drink a day for nonpregnant women and 2 drinks a day for men. One drink equals 12 oz of beer, 5 oz of wine, or 1 oz of hard liquor.  Work with your health care provider to maintain a healthy body weight or to lose weight. Ask what an ideal weight is for you.  Get at least 30 minutes of exercise that causes your heart to beat faster (aerobic exercise) most days of the week. Activities may include walking, swimming, or biking.  Work with your health care provider or diet and nutrition specialist (dietitian) to adjust your eating plan to your individual calorie  needs. Reading food labels  Check food labels for the amount of sodium per serving. Choose foods with less than 5 percent of the Daily Value of sodium. Generally, foods with less than 300 mg of sodium per serving fit into this eating plan.  To find whole grains, look for the word "whole" as the first word in the ingredient list. Shopping  Buy products labeled as "low-sodium" or "no salt added."  Buy fresh foods. Avoid canned foods and premade or frozen meals. Cooking  Avoid adding salt when cooking. Use salt-free seasonings or herbs instead of table salt or sea salt. Check with your health care provider or pharmacist before using salt substitutes.  Do not fry foods. Cook foods using healthy methods such as baking, boiling, grilling, and broiling instead.  Cook with heart-healthy oils, such as olive, canola, soybean, or sunflower oil. Meal planning   Eat a balanced diet that includes: ? 5 or more servings of fruits and vegetables each day. At each meal, try to fill half of your plate with fruits and vegetables. ? Up to 6-8 servings of whole grains each day. ? Less than 6 oz of lean meat, poultry, or fish each day. A 3-oz serving of meat is about the same size as a deck of cards. One egg equals 1 oz. ? 2 servings of low-fat dairy each day. ? A serving of nuts, seeds, or beans 5 times each week. ? Heart-healthy fats. Healthy fats called Omega-3 fatty acids are found in foods such as flaxseeds and coldwater fish, like sardines, salmon, and mackerel.  Limit  how much you eat of the following: ? Canned or prepackaged foods. ? Food that is high in trans fat, such as fried foods. ? Food that is high in saturated fat, such as fatty meat. ? Sweets, desserts, sugary drinks, and other foods with added sugar. ? Full-fat dairy products.  Do not salt foods before eating.  Try to eat at least 2 vegetarian meals each week.  Eat more home-cooked food and less restaurant, buffet, and fast  food.  When eating at a restaurant, ask that your food be prepared with less salt or no salt, if possible. What foods are recommended? The items listed may not be a complete list. Talk with your dietitian about what dietary choices are best for you. Grains Whole-grain or whole-wheat bread. Whole-grain or whole-wheat pasta. Brown rice. Orpah Cobb. Bulgur. Whole-grain and low-sodium cereals. Pita bread. Low-fat, low-sodium crackers. Whole-wheat flour tortillas. Vegetables Fresh or frozen vegetables (raw, steamed, roasted, or grilled). Low-sodium or reduced-sodium tomato and vegetable juice. Low-sodium or reduced-sodium tomato sauce and tomato paste. Low-sodium or reduced-sodium canned vegetables. Fruits All fresh, dried, or frozen fruit. Canned fruit in natural juice (without added sugar). Meat and other protein foods Skinless chicken or [place]. Ground chicken or [place]. Pork with fat trimmed off. Fish and seafood. Egg whites. Dried beans, peas, or lentils. Unsalted nuts, nut butters, and seeds. Unsalted canned beans. Lean cuts of beef with fat trimmed off. Low-sodium, lean deli meat. Dairy Low-fat (1%) or fat-free (skim) milk. Fat-free, low-fat, or reduced-fat cheeses. Nonfat, low-sodium ricotta or cottage cheese. Low-fat or nonfat yogurt. Low-fat, low-sodium cheese. Fats and oils Soft margarine without trans fats. Vegetable oil. Low-fat, reduced-fat, or light mayonnaise and salad dressings (reduced-sodium). Canola, safflower, olive, soybean, and sunflower oils. Avocado. Seasoning and other foods Herbs. Spices. Seasoning mixes without salt. Unsalted popcorn and pretzels. Fat-free sweets. What foods are not recommended? The items listed may not be a complete list. Talk with your dietitian about what dietary choices are best for you. Grains Baked goods made with fat, such as croissants, muffins, or some breads. Dry pasta or rice meal packs. Vegetables Creamed or fried vegetables. Vegetables  in a cheese sauce. Regular canned vegetables (not low-sodium or reduced-sodium). Regular canned tomato sauce and paste (not low-sodium or reduced-sodium). Regular tomato and vegetable juice (not low-sodium or reduced-sodium). Rosita Fire. Olives. Fruits Canned fruit in a light or heavy syrup. Fried fruit. Fruit in cream or butter sauce. Meat and other protein foods Fatty cuts of meat. Ribs. Fried meat. Tomasa Blase. Sausage. Bologna and other processed lunch meats. Salami. Fatback. Hotdogs. Bratwurst. Salted nuts and seeds. Canned beans with added salt. Canned or smoked fish. Whole eggs or egg yolks. Chicken or [place] with skin. Dairy Whole or 2% milk, cream, and half-and-half. Whole or full-fat cream cheese. Whole-fat or sweetened yogurt. Full-fat cheese. Nondairy creamers. Whipped toppings. Processed cheese and cheese spreads. Fats and oils Butter. Stick margarine. Lard. Shortening. Ghee. Bacon fat. Tropical oils, such as coconut, palm kernel, or palm oil. Seasoning and other foods Salted popcorn and pretzels. Onion salt, garlic salt, seasoned salt, table salt, and sea salt. Worcestershire sauce. Tartar sauce. Barbecue sauce. Teriyaki sauce. Soy sauce, including reduced-sodium. Steak sauce. Canned and packaged gravies. Fish sauce. Oyster sauce. Cocktail sauce. Horseradish that you find on the shelf. Ketchup. Mustard. Meat flavorings and tenderizers. Bouillon cubes. Hot sauce and Tabasco sauce. Premade or packaged marinades. Premade or packaged taco seasonings. Relishes. Regular salad dressings. Where to find more information:  National Heart, Lung, and Blood Institute:  PopSteam.iswww.nhlbi.nih.gov  American Heart Association: www.heart.org Summary  The DASH eating plan is a healthy eating plan that has been shown to reduce high blood pressure (hypertension). It may also reduce your risk for type 2 diabetes, heart disease, and stroke.  With the DASH eating plan, you should limit salt (sodium) intake to 2,300 mg a  day. If you have hypertension, you may need to reduce your sodium intake to 1,500 mg a day.  When on the DASH eating plan, aim to eat more fresh fruits and vegetables, whole grains, lean proteins, low-fat dairy, and heart-healthy fats.  Work with your health care provider or diet and nutrition specialist (dietitian) to adjust your eating plan to your individual calorie needs. This information is not intended to replace advice given to you by your health care provider. Make sure you discuss any questions you have with your health care provider. Document Released: 04/12/2011 Document Revised: 04/16/2016 Document Reviewed: 04/16/2016 Elsevier Interactive Patient Education  2017 [employer]

## 2016-10-16 NOTE — Assessment & Plan Note (Signed)
Try the DASH guidelines; start low-dose medicine, return 2-3 weeks recheck

## 2016-10-18 ENCOUNTER — Other Ambulatory Visit: Payer: Self-pay

## 2016-10-18 MED ORDER — OMEPRAZOLE 20 MG PO CPDR: 20.0000 mg | DELAYED_RELEASE_CAPSULE | Freq: Every day | ORAL | 3 refills | Status: DC

## 2016-10-22 DIAGNOSIS — K219 Gastro-esophageal reflux disease without esophagitis: Secondary | ICD-10-CM | POA: Insufficient documentation

## 2016-10-22 NOTE — Assessment & Plan Note (Signed)
Advised that NSAIDs can worsen this condition; avoid certain triggers; discussed risks of PPI

## 2016-10-22 NOTE — Assessment & Plan Note (Signed)
Encouraged weight loss 

## 2016-11-06 ENCOUNTER — Ambulatory Visit: Payer: Self-pay

## 2017-01-08 ENCOUNTER — Ambulatory Visit: Payer: Self-pay | Admitting: Urology

## 2017-01-08 VITALS — BP 149/81 | HR 78 | Temp 98.4°F | Wt 260.0 lb

## 2017-01-08 DIAGNOSIS — B182 Chronic viral hepatitis C: Secondary | ICD-10-CM

## 2017-01-08 DIAGNOSIS — K7460 Unspecified cirrhosis of liver: Principal | ICD-10-CM

## 2017-01-08 MED ORDER — OMEPRAZOLE 20 MG PO CPDR: 20.0000 mg | DELAYED_RELEASE_CAPSULE | Freq: Every day | ORAL | 3 refills | Status: DC

## 2017-01-08 MED ORDER — IBUPROFEN 600 MG PO TABS: 600.0000 mg | ORAL_TABLET | Freq: Three times a day (TID) | ORAL | 0 refills | Status: DC | PRN

## 2017-01-08 MED ORDER — LACTULOSE 10 GM/15ML PO SOLN: 10.0000 g | Freq: Two times a day (BID) | ORAL | 4 refills | Status: DC | PRN

## 2017-01-08 MED ORDER — AMLODIPINE BESYLATE 5 MG PO TABS: 5.0000 mg | ORAL_TABLET | Freq: Every day | ORAL | 3 refills | Status: DC

## 2017-01-08 NOTE — Patient Instructions (Signed)
Phone number for [facility]: [phone]

## 2017-01-08 NOTE — Progress Notes (Signed)
   Subjective:    Patient ID: Monique Edwards, female    DOB: 26-Sep-1965, 51 y.o.   MRN: [id number]  HPI   Pt here for f/u of back, knee, and toe pain from stair accident 5-6 mo ago. Pt reports pain is still persistent. Pt did not get ordered x-rays b/c she lost her charity care application. Pt needs med refill. Pt reports she is unable to fully swallow certain foods for 4 mo. Pt had endoscopy in Feb. 2017 -   Patient Active Problem List   Diagnosis Date Noted  . Morbid obesity (HCC) 10/22/2016  . GERD without esophagitis 10/22/2016  . Toe pain, right 10/16/2016  . Chronic pain of right knee 10/16/2016  . Thoracic spine pain 10/16/2016  . Hypertension 10/16/2016  . Fall (on) (from) other stairs and steps, subsequent encounter 07/19/2016   Allergies as of 01/08/2017   No Known Allergies     Medication List       Accurate as of 01/08/17  7:33 PM. Always use your most recent med list.          amLODipine 5 MG tablet Commonly known as:  NORVASC Take 1 tablet (5 mg total) by mouth daily.   ibuprofen 600 MG tablet Commonly known as:  ADVIL,MOTRIN Take 1 tablet (600 mg total) by mouth every 8 (eight) hours as needed.   lactulose 10 GM/15ML solution Commonly known as:  CHRONULAC Take 15 mLs (10 g total) by mouth 2 (two) times daily as needed for mild constipation.   omeprazole 20 MG capsule Commonly known as:  PRILOSEC Take 1 capsule (20 mg total) by mouth daily.        Review of Systems Pt needs to get previously ordered x-ray of R toe, R knee, and TS. Pt needs to see Gastroenterologist for year apt.  Pt denies blood in vomit or cough.  BP elevated. Pt has not had Amlodipine since June.     Objective:   Physical Exam  Constitutional: She is oriented to person, place, and time. She appears well-developed and well-nourished.  Cardiovascular: Normal rate, regular rhythm and normal heart sounds.   Pulmonary/Chest: Effort normal and breath sounds normal.  Neurological: She  is alert and oriented to person, place, and time.    BP (!) 149/81   Pulse 78   Temp 98.4 F (36.9 C)   Wt 260 lb (117.9 kg)   BMI 44.63 kg/m       Assessment & Plan:   F/u in 2 weeks for BP Check.

## 2017-01-22 ENCOUNTER — Other Ambulatory Visit: Payer: Self-pay

## 2017-01-22 DIAGNOSIS — Z0000 Encounter for general adult medical examination without abnormal findings: Secondary | ICD-10-CM

## 2017-01-23 LAB — LIPID PANEL
CHOL/HDL RATIO: 2.6 ratio (ref 0.0–4.4)
Cholesterol, Total: 144 mg/dL (ref 100–199)
HDL: 56 mg/dL (ref >39)
LDL CALC: 75 mg/dL (ref 0–99)
Triglycerides: 64 mg/dL (ref 0–149)
VLDL Cholesterol Cal: 13 mg/dL (ref 5–40)

## 2017-01-23 LAB — TSH: TSH: 3.09 u[IU]/mL (ref 0.450–4.500)

## 2017-01-23 LAB — HEMOGLOBIN A1C
ESTIMATED AVERAGE GLUCOSE: 114 mg/dL
HEMOGLOBIN A1C: 5.6 % (ref 4.8–5.6)

## 2017-01-24 ENCOUNTER — Telehealth: Payer: Self-pay | Admitting: Pharmacy Technician

## 2017-01-24 NOTE — Telephone Encounter (Signed)
Patient failed to provide current poi.  No additional medication assistance will be provided by [facility] without the required proof of income documentation.  Patient notified by letter.  Nico Rogness J. Linzy Laury Care Manager [facility] 

## 2017-01-29 ENCOUNTER — Ambulatory Visit: Payer: Self-pay

## 2017-01-31 ENCOUNTER — Ambulatory Visit: Payer: Self-pay | Admitting: Adult Health Nurse Practitioner

## 2017-01-31 VITALS — BP 139/76 | HR 70 | Temp 97.3°F | Wt 265.2 lb

## 2017-01-31 DIAGNOSIS — I10 Essential (primary) hypertension: Secondary | ICD-10-CM

## 2017-01-31 MED ORDER — OMEPRAZOLE 20 MG PO CPDR: 20.0000 mg | DELAYED_RELEASE_CAPSULE | Freq: Every day | ORAL | 3 refills | Status: DC

## 2017-01-31 MED ORDER — AMLODIPINE BESYLATE 5 MG PO TABS: 5.0000 mg | ORAL_TABLET | Freq: Every day | ORAL | 3 refills | Status: DC

## 2017-01-31 MED ORDER — IBUPROFEN 600 MG PO TABS: 600.0000 mg | ORAL_TABLET | Freq: Three times a day (TID) | ORAL | 0 refills | Status: DC | PRN

## 2017-01-31 MED ORDER — LISINOPRIL 10 MG PO TABS: 10.0000 mg | ORAL_TABLET | Freq: Every day | ORAL | 3 refills | Status: DC

## 2017-01-31 NOTE — Progress Notes (Signed)
  Patient: Monique Edwards Female    DOB: 1965/06/03   51 y.o.   MRN: [id number] Visit Date: 01/31/2017  Today's Provider: [facility]   No chief complaint on file.  Subjective:    HPI   Here for BP check.  Last visit BP was 149/81.  Pt states that her ankles have been swelling and she has a reddened itchy rash on her R calf.     No Known Allergies Previous Medications   AMLODIPINE (NORVASC) 5 MG TABLET    Take 1 tablet (5 mg total) by mouth daily.   IBUPROFEN (ADVIL,MOTRIN) 600 MG TABLET    Take 1 tablet (600 mg total) by mouth every 8 (eight) hours as needed.   LACTULOSE (CHRONULAC) 10 GM/15ML SOLUTION    Take 15 mLs (10 g total) by mouth 2 (two) times daily as needed for mild constipation.   OMEPRAZOLE (PRILOSEC) 20 MG CAPSULE    Take 1 capsule (20 mg total) by mouth daily.    Review of Systems  All other systems reviewed and are negative.   Social History  Substance Use Topics  . Smoking status: Former Games developer  . Smokeless tobacco: Never Used  . Alcohol use No     Comment: former occassional   Objective:   BP 139/76   Pulse 70   Temp (!) 97.3 F (36.3 C)   Wt 265 lb 3.2 oz (120.3 kg)   BMI 45.52 kg/m   Physical Exam  Constitutional: She appears well-developed and well-nourished.  Cardiovascular: Normal rate and regular rhythm.   Pulmonary/Chest: Effort normal and breath sounds normal.  Skin:  Reddened rash to the inner right calf, no open areas or drainage.   Vitals reviewed.       Assessment & Plan:         HTN:  Controlled.  Goal BP <140/90.  Discontinue Norvasc due to ankle selling.  Start Lisinopril  daily.   Encourage low salt diet and exercise.  FU in 4 weeks with BMP.    Wash rash and use hydrocortisone cream daily.   Reviewed labs.     [facility]   [facility]

## 2017-02-28 ENCOUNTER — Ambulatory Visit: Payer: Self-pay | Admitting: Family Medicine

## 2017-02-28 VITALS — BP 152/88 | HR 61 | Temp 97.9°F | Ht 64.0 in | Wt 256.5 lb

## 2017-02-28 DIAGNOSIS — Z09 Encounter for follow-up examination after completed treatment for conditions other than malignant neoplasm: Secondary | ICD-10-CM

## 2017-02-28 DIAGNOSIS — I10 Essential (primary) hypertension: Secondary | ICD-10-CM

## 2017-02-28 NOTE — Progress Notes (Signed)
  Patient: Monique Edwards Female    DOB: [birth year]   51 y.o.   MRN: [id number] Visit Date: 02/28/2017  Today's Provider: Azzie Glatter, FNP   No chief complaint on file.  Subjective:    HPI: Patient is here today for recheck for hypertension. States taking medications as directed.  Maintaining a low sodium diet.  Has complaints of back pain. Currently waiting for charity care. Taking motrin for pain with minimal relief.     No Known Allergies Previous Medications   IBUPROFEN (ADVIL,MOTRIN) 600 MG TABLET    Take 1 tablet (600 mg total) by mouth every 8 (eight) hours as needed.   LACTULOSE (CHRONULAC) 10 GM/15ML SOLUTION    Take 15 mLs (10 g total) by mouth 2 (two) times daily as needed for mild constipation.   LISINOPRIL (PRINIVIL,ZESTRIL) 10 MG TABLET    Take 1 tablet (10 mg total) by mouth daily.   OMEPRAZOLE (PRILOSEC) 20 MG CAPSULE    Take 1 capsule (20 mg total) by mouth daily.  +   Review of Systems  All other systems reviewed and are negative.   Social History  Substance Use Topics  . Smoking status: Former Research scientist (life sciences)  . Smokeless tobacco: Never Used  . Alcohol use No     Comment: former occassional   Objective:   BP (!) 152/88   Pulse 61   Temp 97.9 F (36.6 C)   Ht '5\' 4"'$  (1.626 m)   Wt 256 lb 8 oz (116.3 kg)   LMP 02/28/2014 (Approximate)   BMI 44.03 kg/m   Physical Exam  Constitutional: She is oriented to person, place, and time. She appears well-developed and well-nourished.  Eyes: Pupils are equal, round, and reactive to light.  Neck: Normal range of motion. Neck supple.  Cardiovascular: Normal rate, regular rhythm, normal heart sounds and intact distal pulses.   Pulmonary/Chest: Effort normal and breath sounds normal.  Abdominal: Soft. Bowel sounds are normal.  Musculoskeletal: Normal range of motion.  Neurological: She is alert and oriented to person, place, and time.  Skin: Skin is warm and dry.  Psychiatric: She has a normal mood and affect. Her  behavior is normal. Judgment and thought content normal.  Vitals reviewed.       Assessment & Plan:     1. Essential hypertension Continue bp meds as directed Continue low sodium diet, exercise. - CBC; Future - Comp Met (CMET); Future    2. Follow up BP check in 1 month. Return documents for charity care application Continue Ibuprofen for back pain .    Azzie Glatter, FNP   [facility]

## 2017-02-28 NOTE — Progress Notes (Signed)
   Subjective:    Patient ID: Monique Edwards, female    DOB: [birth year], 51 y.o.   MRN: [id number]  HPI    Review of Systems     Objective:   Physical Exam        Assessment & Plan:

## 2017-03-04 ENCOUNTER — Other Ambulatory Visit: Payer: Self-pay | Admitting: Adult Health Nurse Practitioner

## 2017-03-27 ENCOUNTER — Other Ambulatory Visit: Payer: Self-pay

## 2017-04-04 ENCOUNTER — Ambulatory Visit: Payer: Self-pay | Admitting: Urology

## 2017-04-04 VITALS — BP 217/112 | HR 68 | Temp 97.8°F | Wt 251.3 lb

## 2017-04-04 DIAGNOSIS — I10 Essential (primary) hypertension: Secondary | ICD-10-CM

## 2017-04-04 MED ORDER — AMLODIPINE BESYLATE 10 MG PO TABS: 10.0000 mg | ORAL_TABLET | Freq: Every day | ORAL | 3 refills | Status: DC

## 2017-04-04 MED ORDER — NORTRIPTYLINE HCL 25 MG PO CAPS: 25.0000 mg | ORAL_CAPSULE | Freq: Every day | ORAL | 3 refills | Status: DC

## 2017-04-04 MED ORDER — LISINOPRIL 20 MG PO TABS
20.0000 mg | ORAL_TABLET | Freq: Every day | ORAL | 3 refills | Status: DC
Start: 2017-04-04 — End: 2017-08-22

## 2017-04-04 NOTE — Progress Notes (Signed)
  Patient: Monique Edwards Female    DOB: 1966-02-06   51 y.o.   MRN: [id number] Visit Date: 04/04/2017  Today's Provider: [facility]   Chief Complaint  Patient presents with  . Hypertension  . Follow-up   Subjective:    HPI  Uncontrolled HTN - taking lisinopril 10 mg daily -having HA's  Having a hard time getting to sleep - takes 600 mg ibuprophen at night    No Known Allergies This SmartLink is deprecated. Use AVSMEDLIST instead to display the medication list for a patient.  Review of Systems  Neurological: Positive for headaches.  All other systems reviewed and are negative.   Social History   Tobacco Use  . Smoking status: Former Games developermoker  . Smokeless tobacco: Never Used  Substance Use Topics  . Alcohol use: No    Comment: former occassional   Objective:   BP (!) 217/112 (BP Location: Right Arm, Patient Position: Sitting, Cuff Size: Large)   Pulse 68   Temp 97.8 F (36.6 C)   Wt 251 lb 4.8 oz (114 kg)   LMP 02/28/2014 (Approximate)   BMI 43.14 kg/m   Physical Exam Constitutional: Well nourished. Alert and oriented, No acute distress. HEENT: [place] AT, moist mucus membranes. Trachea midline, no masses. Cardiovascular: No clubbing, cyanosis, or edema. Respiratory: Normal respiratory effort, no increased work of breathing. Skin: No rashes, bruises or suspicious lesions. Lymph: No cervical or inguinal adenopathy. Neurologic: Grossly intact, no focal deficits, moving all 4 extremities. Psychiatric: Normal mood and affect.      Assessment & Plan:   1. Uncontrolled HTN  - increase lisinopril to 20 mg daily and add amlodipine 10 mg daily  - RTC in 2 weeks for BP check  2. Arthritis  - nortriptyline 25 mg qhs  Labs tonight - TSH, HBGA1c, lipid panel, CBC and CMP       [facility]   [facility] [place]

## 2017-04-05 LAB — LIPID PANEL
CHOLESTEROL TOTAL: 138 mg/dL (ref 100–199)
Chol/HDL Ratio: 2.9 ratio (ref 0.0–4.4)
HDL: 48 mg/dL (ref >39)
LDL Calculated: 75 mg/dL (ref 0–99)
Triglycerides: 77 mg/dL (ref 0–149)
VLDL Cholesterol Cal: 15 mg/dL (ref 5–40)

## 2017-04-05 LAB — HEMOGLOBIN A1C
ESTIMATED AVERAGE GLUCOSE: 111 mg/dL
HEMOGLOBIN A1C: 5.5 % (ref 4.8–5.6)

## 2017-04-05 LAB — TSH: TSH: 3.25 u[IU]/mL (ref 0.450–4.500)

## 2017-04-06 LAB — COMPREHENSIVE METABOLIC PANEL
ALT: 25 IU/L (ref 0–32)
AST: 31 IU/L (ref 0–40)
Albumin/Globulin Ratio: 1.5 (ref 1.2–2.2)
Albumin: 4.4 g/dL (ref 3.5–5.5)
Alkaline Phosphatase: 58 IU/L (ref 39–117)
BUN/Creatinine Ratio: 12 (ref 9–23)
BUN: 11 mg/dL (ref 6–24)
Bilirubin Total: 0.3 mg/dL (ref 0.0–1.2)
CO2: 23 mmol/L (ref 20–29)
Calcium: 9.3 mg/dL (ref 8.7–10.2)
Chloride: 104 mmol/L (ref 96–106)
Creatinine, Ser: 0.9 mg/dL (ref 0.57–1.00)
GFR calc Af Amer: 84 mL/min/{1.73_m2} (ref >59)
GFR calc non Af Amer: 73 mL/min/{1.73_m2} (ref >59)
Globulin, Total: 2.9 g/dL (ref 1.5–4.5)
Glucose: 93 mg/dL (ref 65–99)
Potassium: 4.2 mmol/L (ref 3.5–5.2)
Sodium: 141 mmol/L (ref 134–144)
Total Protein: 7.3 g/dL (ref 6.0–8.5)

## 2017-04-06 LAB — CBC
Hematocrit: 36.9 % (ref 34.0–46.6)
Hemoglobin: 12.1 g/dL (ref 11.1–15.9)
MCH: 28.5 pg (ref 26.6–33.0)
MCHC: 32.8 g/dL (ref 31.5–35.7)
MCV: 87 fL (ref 79–97)
Platelets: 79 10*3/uL — CL (ref 150–379)
RBC: 4.24 x10E6/uL (ref 3.77–5.28)
RDW: 14 % (ref 12.3–15.4)
WBC: 4.4 10*3/uL (ref 3.4–10.8)

## 2017-04-11 ENCOUNTER — Ambulatory Visit: Payer: Self-pay | Admitting: Ophthalmology

## 2017-04-18 ENCOUNTER — Ambulatory Visit: Payer: Self-pay | Admitting: Ophthalmology

## 2017-04-25 ENCOUNTER — Ambulatory Visit: Payer: Self-pay | Admitting: Ophthalmology

## 2017-05-09 ENCOUNTER — Other Ambulatory Visit: Payer: Self-pay | Admitting: Adult Health Nurse Practitioner

## 2017-06-06 ENCOUNTER — Telehealth: Payer: Self-pay | Admitting: Family Medicine

## 2017-06-06 NOTE — Telephone Encounter (Signed)
Left message with nephew for patient to call office to set up time for repeat labs and OV to assess.

## 2017-06-20 ENCOUNTER — Other Ambulatory Visit: Payer: Self-pay

## 2017-06-20 DIAGNOSIS — Z0000 Encounter for general adult medical examination without abnormal findings: Secondary | ICD-10-CM

## 2017-06-21 LAB — COMPREHENSIVE METABOLIC PANEL
A/G RATIO: 1.3 (ref 1.2–2.2)
ALT: 27 IU/L (ref 0–32)
AST: 30 IU/L (ref 0–40)
Albumin: 4.1 g/dL (ref 3.5–5.5)
Alkaline Phosphatase: 70 IU/L (ref 39–117)
BUN/Creatinine Ratio: 16 (ref 9–23)
BUN: 13 mg/dL (ref 6–24)
Bilirubin Total: 0.3 mg/dL (ref 0.0–1.2)
CALCIUM: 9.2 mg/dL (ref 8.7–10.2)
CO2: 21 mmol/L (ref 20–29)
CREATININE: 0.82 mg/dL (ref 0.57–1.00)
Chloride: 105 mmol/L (ref 96–106)
GFR, EST AFRICAN AMERICAN: 94 mL/min/{1.73_m2} (ref >59)
GFR, EST NON AFRICAN AMERICAN: 82 mL/min/{1.73_m2} (ref >59)
Globulin, Total: 3.1 g/dL (ref 1.5–4.5)
Glucose: 98 mg/dL (ref 65–99)
Potassium: 3.9 mmol/L (ref 3.5–5.2)
Sodium: 142 mmol/L (ref 134–144)
TOTAL PROTEIN: 7.2 g/dL (ref 6.0–8.5)

## 2017-06-21 LAB — CBC
HEMATOCRIT: 31.6 % — AB (ref 34.0–46.6)
HEMOGLOBIN: 11 g/dL — AB (ref 11.1–15.9)
MCH: 29 pg (ref 26.6–33.0)
MCHC: 34.8 g/dL (ref 31.5–35.7)
MCV: 83 fL (ref 79–97)
Platelets: 81 10*3/uL — CL (ref 150–379)
RBC: 3.79 x10E6/uL (ref 3.77–5.28)
RDW: 14.9 % (ref 12.3–15.4)
WBC: 4.3 10*3/uL (ref 3.4–10.8)

## 2017-06-21 LAB — LIPID PANEL
CHOLESTEROL TOTAL: 145 mg/dL (ref 100–199)
Chol/HDL Ratio: 2.5 ratio (ref 0.0–4.4)
HDL: 58 mg/dL (ref >39)
LDL CALC: 76 mg/dL (ref 0–99)
TRIGLYCERIDES: 55 mg/dL (ref 0–149)
VLDL CHOLESTEROL CAL: 11 mg/dL (ref 5–40)

## 2017-06-21 LAB — HEMOGLOBIN A1C
ESTIMATED AVERAGE GLUCOSE: 114 mg/dL
Hgb A1c MFr Bld: 5.6 % (ref 4.8–5.6)

## 2017-06-21 LAB — TSH: TSH: 3.03 u[IU]/mL (ref 0.450–4.500)

## 2017-06-27 ENCOUNTER — Ambulatory Visit: Payer: Self-pay

## 2017-07-16 ENCOUNTER — Telehealth: Payer: Self-pay

## 2017-07-16 NOTE — Telephone Encounter (Signed)
-----   Message from Ezekiel InaLorrie D Carter sent at 07/04/2017  3:29 PM EST -----   ----- Message ----- From: Harle BattiestMcGowan, Shannon A, PA-C Sent: 06/30/2017   7:47 PM To: Ezekiel InaLorrie D Carter  Her low platelets are most likely due to her cirrhosis.  She has not had a liver ultrasound recently and she needs to have one every 6 months to monitor for liver cancer.  We should get one scheduled.

## 2017-07-16 NOTE — Telephone Encounter (Signed)
Spoke with pt re lab results. She would like to go ahead with the live u/s. Pt to bring by the rest of the documents need for charity care on Thursday.

## 2017-07-17 ENCOUNTER — Other Ambulatory Visit: Payer: Self-pay

## 2017-07-18 ENCOUNTER — Other Ambulatory Visit: Payer: Self-pay

## 2017-07-18 ENCOUNTER — Ambulatory Visit: Payer: Self-pay

## 2017-07-18 DIAGNOSIS — K732 Chronic active hepatitis, not elsewhere classified: Secondary | ICD-10-CM

## 2017-07-18 DIAGNOSIS — B1710 Acute hepatitis C without hepatic coma: Secondary | ICD-10-CM

## 2017-07-25 ENCOUNTER — Ambulatory Visit
Admission: RE | Admit: 2017-07-25 | Discharge: 2017-07-25 | Disposition: A | Discharge status: Home / Self Care | Payer: Self-pay | Source: Ambulatory Visit | Attending: Adult Health | Admitting: Adult Health

## 2017-07-25 ENCOUNTER — Telehealth: Payer: Self-pay

## 2017-07-25 DIAGNOSIS — K732 Chronic active hepatitis, not elsewhere classified: Secondary | ICD-10-CM | POA: Insufficient documentation

## 2017-07-25 DIAGNOSIS — K7460 Unspecified cirrhosis of liver: Secondary | ICD-10-CM | POA: Insufficient documentation

## 2017-07-25 NOTE — Telephone Encounter (Signed)
Liver u/s is scheduled for 07/25/17. Pt would like to get previous xrays ordered at this time also. Called radiology and they said this would be arranged.

## 2017-08-13 ENCOUNTER — Other Ambulatory Visit: Payer: Self-pay | Admitting: Urology

## 2017-08-15 ENCOUNTER — Ambulatory Visit: Payer: Self-pay

## 2017-08-22 ENCOUNTER — Ambulatory Visit: Payer: Self-pay | Admitting: Adult Health

## 2017-08-22 ENCOUNTER — Encounter: Payer: Self-pay | Admitting: Adult Health

## 2017-08-22 VITALS — BP 142/81 | HR 70 | Temp 98.0°F | Ht 64.0 in | Wt 257.0 lb

## 2017-08-22 DIAGNOSIS — R0602 Shortness of breath: Secondary | ICD-10-CM

## 2017-08-22 DIAGNOSIS — K219 Gastro-esophageal reflux disease without esophagitis: Secondary | ICD-10-CM

## 2017-08-22 DIAGNOSIS — I10 Essential (primary) hypertension: Secondary | ICD-10-CM

## 2017-08-22 DIAGNOSIS — K7460 Unspecified cirrhosis of liver: Secondary | ICD-10-CM

## 2017-08-22 DIAGNOSIS — K760 Fatty (change of) liver, not elsewhere classified: Secondary | ICD-10-CM

## 2017-08-22 DIAGNOSIS — W19XXXA Unspecified fall, initial encounter: Secondary | ICD-10-CM

## 2017-08-22 DIAGNOSIS — B182 Chronic viral hepatitis C: Secondary | ICD-10-CM

## 2017-08-22 DIAGNOSIS — M542 Cervicalgia: Secondary | ICD-10-CM

## 2017-08-22 MED ORDER — LISINOPRIL 20 MG PO TABS: 20.0000 mg | ORAL_TABLET | Freq: Every day | ORAL | 3 refills | Status: DC

## 2017-08-22 MED ORDER — PREDNISONE 10 MG PO TABS: 10.0000 mg | ORAL_TABLET | Freq: Every day | ORAL | 0 refills | Status: DC

## 2017-08-22 MED ORDER — ALBUTEROL SULFATE HFA 108 (90 BASE) MCG/ACT IN AERS: 1.0000 | INHALATION_SPRAY | RESPIRATORY_TRACT | 1 refills | Status: DC | PRN

## 2017-08-22 MED ORDER — OMEPRAZOLE 20 MG PO CPDR: 20.0000 mg | DELAYED_RELEASE_CAPSULE | Freq: Every day | ORAL | 3 refills | Status: DC

## 2017-08-22 NOTE — Progress Notes (Signed)
Patient: Monique Edwards Female    DOB: January 20, 1966   52 y.o.   MRN: [id number] Visit Date: 08/22/2017  Today's Provider: Tally Joe, NP   Chief Complaint  Patient presents with  . Follow-up    med refill   Subjective:    HPI Patient presents for follow-up of hypertension, spine pain s/p fall and lower extremity edema. Fell from 11 flight of steps about 10 months ago; was evaluated in the ED and there was nothing wrong with her back.  Reports persistent bilateral shoulder blade and neck pain.  Minimal relief with over-the-counter pain remedies.  She was supposed to have x-rays done but has not done so yet. Had liver ultrasound that showed fatty liver. She was treated for Hep C with complete resolution. She is supposed to be on Norvasc and lisinopril for her blood pressure but reports that she has been taking just the lisinopril 20 mg daily.  Denies chest pain palpitations nausea and vomiting but reports mild increase lower extremity edema; mostly dependent she reports occasional shortness of breath with exertion.  She does not have a baseline echocardiogram nor has she seen a cardiologist in the past.  She has not taken her blood pressure medications and over 2 weeks  No Known Allergies Previous Medications   AMLODIPINE (NORVASC) 10 MG TABLET    Take 1 tablet (10 mg total) by mouth daily.   IBUPROFEN (ADVIL,MOTRIN) 600 MG TABLET    TAKE 1 TABLET BY MOUTH EVERY 8 HOURS AS NEEDED   LACTULOSE (CHRONULAC) 10 GM/15ML SOLUTION    Take 15 mLs (10 g total) by mouth 2 (two) times daily as needed for mild constipation.   LISINOPRIL (PRINIVIL,ZESTRIL) 20 MG TABLET    Take 1 tablet (20 mg total) by mouth daily.   NORTRIPTYLINE (PAMELOR) 25 MG CAPSULE    Take 1 capsule (25 mg total) by mouth at bedtime.   OMEPRAZOLE (PRILOSEC) 20 MG CAPSULE    Take 1 capsule (20 mg total) by mouth daily.    Review of Systems  Constitutional: Negative.   HENT: Negative.   Eyes: Negative.   Respiratory: Positive  for shortness of breath.   Cardiovascular: Positive for leg swelling. Negative for chest pain and palpitations.  Gastrointestinal: Negative.   Endocrine: Negative.   Genitourinary: Negative.   Musculoskeletal: Negative.   Skin: Negative.   Hematological: Negative.   Psychiatric/Behavioral: Negative.     Social History   Tobacco Use  . Smoking status: Former Games developer  . Smokeless tobacco: Never Used  Substance Use Topics  . Alcohol use: No    Comment: former occassional   Objective:   BP (!) 142/81   Pulse 70   Temp 98 F (36.7 C)   Ht 5\' 4"  (1.626 m)   Wt 257 lb (116.6 kg)   LMP 02/28/2014 (Approximate)   BMI 44.11 kg/m   Physical Exam  Constitutional: She is oriented to person, place, and time. She appears well-developed and well-nourished.  HENT:  Head: Normocephalic and atraumatic.  Eyes: Pupils are equal, round, and reactive to light. Conjunctivae and EOM are normal.  Neck: Normal range of motion. Neck supple.  Cardiovascular: Normal rate, regular rhythm, normal heart sounds and intact distal pulses.  Pulmonary/Chest: Effort normal and breath sounds normal.  Breath sounds diminished in the bases  Abdominal: Soft. Bowel sounds are normal.  Musculoskeletal: Normal range of motion. She exhibits edema.  Neurological: She is alert and oriented to person, place, and time.  Skin: Skin is warm  and dry.  Psychiatric: She has a normal mood and affect.      Assessment & Plan:  1. Fatty liver Follow-up with the liver clinic/GI  2. Essential hypertension Uncontrolled due to nonadherence.  Resume lisinopril  3. GERD without esophagitis Continue current medications  4. Shortness of breath/Edema Due to poorly controlled hypertension.  Will hold off on starting patient on a diuretic for now and optimize her blood pressure control.  If she remains dyspneic with worsening lower extremity edema will consider air cardiology referral for a baseline echocardiogram and start on a  low-dose diuretic  5. Cervicalgia status post fall Continue over-the-counter pain remedies - DG Cervical Spine Complete; Future  6. Fall, initial encounter Fall and safety precautions reviewed  Tally JoeMagddalene S Tukov, NP   [facility] [place]

## 2017-08-23 ENCOUNTER — Ambulatory Visit
Admission: RE | Admit: 2017-08-23 | Discharge: 2017-08-23 | Disposition: A | Discharge status: Home / Self Care | Payer: Self-pay | Source: Ambulatory Visit | Attending: Adult Health | Admitting: Adult Health

## 2017-08-23 ENCOUNTER — Ambulatory Visit
Admission: RE | Admit: 2017-08-23 | Discharge: 2017-08-23 | Disposition: A | Discharge status: Home / Self Care | Payer: Self-pay | Source: Ambulatory Visit | Attending: Family Medicine | Admitting: Family Medicine

## 2017-08-23 DIAGNOSIS — M79674 Pain in right toe(s): Secondary | ICD-10-CM | POA: Insufficient documentation

## 2017-08-23 DIAGNOSIS — M25561 Pain in right knee: Secondary | ICD-10-CM

## 2017-08-23 DIAGNOSIS — G8929 Other chronic pain: Secondary | ICD-10-CM

## 2017-08-23 DIAGNOSIS — M47812 Spondylosis without myelopathy or radiculopathy, cervical region: Secondary | ICD-10-CM | POA: Insufficient documentation

## 2017-08-23 DIAGNOSIS — M542 Cervicalgia: Secondary | ICD-10-CM

## 2017-08-23 DIAGNOSIS — M546 Pain in thoracic spine: Secondary | ICD-10-CM

## 2017-08-30 ENCOUNTER — Other Ambulatory Visit: Payer: Self-pay | Admitting: Adult Health Nurse Practitioner

## 2017-09-05 ENCOUNTER — Ambulatory Visit: Payer: Self-pay | Admitting: Family Medicine

## 2017-09-05 VITALS — BP 150/80 | Temp 98.2°F | Wt 259.7 lb

## 2017-09-05 DIAGNOSIS — I10 Essential (primary) hypertension: Secondary | ICD-10-CM

## 2017-09-05 DIAGNOSIS — W108XXD Fall (on) (from) other stairs and steps, subsequent encounter: Secondary | ICD-10-CM

## 2017-09-05 DIAGNOSIS — M62830 Muscle spasm of back: Secondary | ICD-10-CM

## 2017-09-05 MED ORDER — LISINOPRIL 40 MG PO TABS: 40.0000 mg | ORAL_TABLET | Freq: Every day | ORAL | 2 refills | Status: DC

## 2017-09-05 NOTE — Progress Notes (Signed)
Subjective:   Monique Edwards is a 52 y.o. female with a history of hypertension, morbid obesity, chronic knee pain, GERD here for follow-up of x-ray results and hypertension  Neck/back pain: Patient reports constant pain on the left side of her spine and between her shoulder blades.  This is been present since she fell down the stairs while drinking about 13 months ago.  She was evaluated in the emergency department the same day that the accident occurred.  She had x-rays at that time and they were all benign.  She reports that currently the pain is worse with sleeping, sitting back in a chair, and using her arms a lot.  She has not noticed anything that makes it better.  She has tried ibuprofen.  She denies radiation of the pain, numbness or weakness of her upper extremities.  At her last visit a few weeks ago, cervical spine x-rays were ordered and she was given a 10-day course of prednisone 10 mg daily.  She is finished this course and does not think that it helped.  HTN: - Medications: was previously prescribed amlodipine 10 mg daily and lisinopril 20 mg daily - Compliance: She did not know that she was still supposed to be taking the amlodipine and was only taking lisinopril 20 mg daily - Checking BP at home: No.  Automatic cuff in clinic tonight was unable to get blood pressure reading for her either - Denies any SOB, CP, vision changes, LE edema, medication SEs, or symptoms of hypotension - Diet: Describes eating a lot of fast food and soda - Exercise: None   Review of Systems:  Per HPI.   Social History   Socioeconomic History  . Marital status: Single    Spouse name: Not on file  . Number of children: Not on file  . Years of education: Not on file  . Highest education level: Not on file  Occupational History  . Not on file  Social Needs  . Financial resource strain: Not on file  . Food insecurity:    Worry: Not on file    Inability: Not on file  . Transportation needs:   Medical: Not on file    Non-medical: Not on file  Tobacco Use  . Smoking status: Former Games developer  . Smokeless tobacco: Never Used  Substance and Sexual Activity  . Alcohol use: No    Comment: former occassional  . Drug use: No  . Sexual activity: Not on file  Lifestyle  . Physical activity:    Days per week: Not on file    Minutes per session: Not on file  . Stress: Not on file  Relationships  . Social connections:    Talks on phone: Not on file    Gets together: Not on file    Attends religious service: Not on file    Active member of club or organization: Not on file    Attends meetings of clubs or organizations: Not on file    Relationship status: Not on file  Other Topics Concern  . Not on file  Social History Narrative  . Not on file     Objective:  BP (!) 150/80   Temp 98.2 F (36.8 C)   Wt 259 lb 11.2 oz (117.8 kg)   LMP 02/28/2014 (Approximate)   BMI 44.58 kg/m   Gen:  52 y.o. female in NAD HEENT: NCAT, MMM, EOMI, PERRL, anicteric sclerae CV: RRR, no MRG Resp: Non-labored, CTAB, no wheezes noted Abd: Soft, NTND Ext:  WWP, no edema MSK: No obvious deformities, gait intact.  No midline spinous process tenderness of her spine.  Point tenderness over left paraspinal muscles of thoracic spine. shoulder without deformity and range of motion intact. Neuro: Alert and oriented, speech normal       Chemistry      Component Value Date/Time   NA 142 06/20/2017 1905   K 3.9 06/20/2017 1905   CL 105 06/20/2017 1905   CO2 21 06/20/2017 1905   BUN 13 06/20/2017 1905   CREATININE 0.82 06/20/2017 1905      Component Value Date/Time   CALCIUM 9.2 06/20/2017 1905   ALKPHOS 70 06/20/2017 1905   AST 30 06/20/2017 1905   ALT 27 06/20/2017 1905   BILITOT 0.3 06/20/2017 1905      Lab Results  Component Value Date   WBC 4.3 06/20/2017   HGB 11.0 (L) 06/20/2017   HCT 31.6 (L) 06/20/2017   MCV 83 06/20/2017   PLT 81 (LL) 06/20/2017   Lab Results  Component Value  Date   TSH 3.030 06/20/2017   Lab Results  Component Value Date   HGBA1C 5.6 06/20/2017    Dg Cervical Spine Complete  Result Date: 08/23/2017 CLINICAL DATA:  Fall down steps several months ago with persistent neck pain, initial encounter EXAM: CERVICAL SPINE - COMPLETE 4+ VIEW COMPARISON:  None. FINDINGS: Seven cervical segments are well visualized. Well corticated calcification is noted adjacent to the spinous process of C7 and T1. These may be related to prior trauma with nonunion. Mild facet hypertrophic changes are seen. No prevertebral soft tissue abnormality is noted. Mild neural foraminal narrowing is noted at C3-4 on the right. No acute fracture or acute facet abnormality is noted. The odontoid is within normal limits. IMPRESSION: Mild degenerative change without acute abnormality. Electronically Signed   By: Alcide Clever M.D.   On: 08/23/2017 16:13    Assessment & Plan:     Monique Edwards is a 52 y.o. female here for:  Hypertension Uncontrolled Discussed DASH diet and exercise Increase lisinopril to 40 mg daily We will take amlodipine off of her medicine list as she is not taking meds Follow-up in 3 months  Fall (on) (from) other stairs and steps, subsequent encounter Patient with fall 13 months ago chronic pain since that time She has had imaging at the time of injury without any fractures or abnormalities Reviewed her recent C-spine x-ray with her and it shows mild degenerative changes without acute abnormalities or fractures See below plan for muscle spasms  Paraspinal muscle spasm Exam is consistent with paraspinal musculature spasm of the thoracic spine X-rays were unrevealing and likely did not cut low enough given where her pain is on exam Discussed stretching, heat, massage Can use ibuprofen as needed, though sparingly given her hypertension Return precautions discussed  Morbid obesity (HCC) Encouraged weight loss Discussed importance of healthy weight  management, diet and exercise   Return in about 3 months (around 12/06/2017) for blood pressure f/u.   Erasmo Downer, MD, MPH [facility] 09/06/2017 10:46 AM

## 2017-09-05 NOTE — Patient Instructions (Signed)
Increase lisinopril to  daily   Back Exercises The following exercises strengthen the muscles that help to support the back. They also help to keep the lower back flexible. Doing these exercises can help to prevent back pain or lessen existing pain. If you have back pain or discomfort, try doing these exercises 2-3 times each day or as told by your health care provider. When the pain goes away, do them once each day, but increase the number of times that you repeat the steps for each exercise (do more repetitions). If you do not have back pain or discomfort, do these exercises once each day or as told by your health care provider. Exercises Single Knee to Chest  Repeat these steps 3-5 times for each leg: 1. Lie on your back on a firm bed or the floor with your legs extended. 2. Bring one knee to your chest. Your other leg should stay extended and in contact with the floor. 3. Hold your knee in place by grabbing your knee or thigh. 4. Pull on your knee until you feel a gentle stretch in your lower back. 5. Hold the stretch for 10-30 seconds. 6. Slowly release and straighten your leg.  Pelvic Tilt  Repeat these steps 5-10 times: 1. Lie on your back on a firm bed or the floor with your legs extended. 2. Bend your knees so they are pointing toward the ceiling and your feet are flat on the floor. 3. Tighten your lower abdominal muscles to press your lower back against the floor. This motion will tilt your pelvis so your tailbone points up toward the ceiling instead of pointing to your feet or the floor. 4. With gentle tension and even breathing, hold this position for 5-10 seconds.  Cat-Cow  Repeat these steps until your lower back becomes more flexible: 1. Get into a hands-and-knees position on a firm surface. Keep your hands under your shoulders, and keep your knees under your hips. You may place padding under your knees for comfort. 2. Let your head hang down, and point your tailbone  toward the floor so your lower back becomes rounded like the back of a cat. 3. Hold this position for 5 seconds. 4. Slowly lift your head and point your tailbone up toward the ceiling so your back forms a sagging arch like the back of a cow. 5. Hold this position for 5 seconds.  Press-Ups  Repeat these steps 5-10 times: 1. Lie on your abdomen (face-down) on the floor. 2. Place your palms near your head, about shoulder-width apart. 3. While you keep your back as relaxed as possible and keep your hips on the floor, slowly straighten your arms to raise the top half of your body and lift your shoulders. Do not use your back muscles to raise your upper torso. You may adjust the placement of your hands to make yourself more comfortable. 4. Hold this position for 5 seconds while you keep your back relaxed. 5. Slowly return to lying flat on the floor.  Bridges  Repeat these steps 10 times: 1. Lie on your back on a firm surface. 2. Bend your knees so they are pointing toward the ceiling and your feet are flat on the floor. 3. Tighten your buttocks muscles and lift your buttocks off of the floor until your waist is at almost the same height as your knees. You should feel the muscles working in your buttocks and the back of your thighs. If you do not feel these muscles,  slide your feet 1-2 inches farther away from your buttocks. 4. Hold this position for 3-5 seconds. 5. Slowly lower your hips to the starting position, and allow your buttocks muscles to relax completely.  If this exercise is too easy, try doing it with your arms crossed over your chest. Abdominal Crunches  Repeat these steps 5-10 times: 1. Lie on your back on a firm bed or the floor with your legs extended. 2. Bend your knees so they are pointing toward the ceiling and your feet are flat on the floor. 3. Cross your arms over your chest. 4. Tip your chin slightly toward your chest without bending your neck. 5. Tighten your  abdominal muscles and slowly raise your trunk (torso) high enough to lift your shoulder blades a tiny bit off of the floor. Avoid raising your torso higher than that, because it can put too much stress on your low back and it does not help to strengthen your abdominal muscles. 6. Slowly return to your starting position.  Back Lifts Repeat these steps 5-10 times: 1. Lie on your abdomen (face-down) with your arms at your sides, and rest your forehead on the floor. 2. Tighten the muscles in your legs and your buttocks. 3. Slowly lift your chest off of the floor while you keep your hips pressed to the floor. Keep the back of your head in line with the curve in your back. Your eyes should be looking at the floor. 4. Hold this position for 3-5 seconds. 5. Slowly return to your starting position.  Contact a health care provider if:  Your back pain or discomfort gets much worse when you do an exercise.  Your back pain or discomfort does not lessen within 2 hours after you exercise. If you have any of these problems, stop doing these exercises right away. Do not do them again unless your health care provider says that you can. Get help right away if:  You develop sudden, severe back pain. If this happens, stop doing the exercises right away. Do not do them again unless your health care provider says that you can. This information is not intended to replace advice given to you by your health care provider. Make sure you discuss any questions you have with your health care provider. Document Released: 05/31/2004 Document Revised: 08/31/2015 Document Reviewed: 06/17/2014 Elsevier Interactive Patient Education  2017 [employer].

## 2017-09-06 ENCOUNTER — Encounter: Payer: Self-pay | Admitting: Family Medicine

## 2017-09-06 NOTE — Assessment & Plan Note (Signed)
Uncontrolled Discussed DASH diet and exercise Increase lisinopril to 40 mg daily We will take amlodipine off of her medicine list as she is not taking meds Follow-up in 3 months

## 2017-09-06 NOTE — Assessment & Plan Note (Signed)
Encouraged weight loss Discussed importance of healthy weight management, diet and exercise

## 2017-09-06 NOTE — Assessment & Plan Note (Signed)
Patient with fall 13 months ago chronic pain since that time She has had imaging at the time of injury without any fractures or abnormalities Reviewed her recent C-spine x-ray with her and it shows mild degenerative changes without acute abnormalities or fractures See below plan for muscle spasms

## 2017-09-06 NOTE — Assessment & Plan Note (Signed)
Exam is consistent with paraspinal musculature spasm of the thoracic spine X-rays were unrevealing and likely did not cut low enough given where her pain is on exam Discussed stretching, heat, massage Can use ibuprofen as needed, though sparingly given her hypertension Return precautions discussed

## 2017-11-19 ENCOUNTER — Emergency Department: Payer: Self-pay

## 2017-11-19 ENCOUNTER — Emergency Department
Admission: EM | Admit: 2017-11-19 | Discharge: 2017-11-19 | Disposition: A | Discharge status: Home / Self Care | Payer: Self-pay | Attending: Emergency Medicine | Admitting: Emergency Medicine

## 2017-11-19 ENCOUNTER — Other Ambulatory Visit: Payer: Self-pay

## 2017-11-19 DIAGNOSIS — R519 Headache, unspecified: Secondary | ICD-10-CM

## 2017-11-19 DIAGNOSIS — Z733 Stress, not elsewhere classified: Secondary | ICD-10-CM | POA: Insufficient documentation

## 2017-11-19 DIAGNOSIS — Z87891 Personal history of nicotine dependence: Secondary | ICD-10-CM | POA: Insufficient documentation

## 2017-11-19 DIAGNOSIS — G4700 Insomnia, unspecified: Secondary | ICD-10-CM | POA: Insufficient documentation

## 2017-11-19 DIAGNOSIS — R51 Headache: Secondary | ICD-10-CM

## 2017-11-19 DIAGNOSIS — I10 Essential (primary) hypertension: Secondary | ICD-10-CM | POA: Insufficient documentation

## 2017-11-19 HISTORY — DX: Essential (primary) hypertension: I10

## 2017-11-19 LAB — COMPREHENSIVE METABOLIC PANEL
ALBUMIN: 3.7 g/dL (ref 3.5–5.0)
ALK PHOS: 52 U/L (ref 38–126)
ALT: 25 U/L (ref 0–44)
ANION GAP: 6 (ref 5–15)
AST: 39 U/L (ref 15–41)
BUN: 12 mg/dL (ref 6–20)
CALCIUM: 8.5 mg/dL — AB (ref 8.9–10.3)
CHLORIDE: 107 mmol/L (ref 98–111)
CO2: 26 mmol/L (ref 22–32)
Creatinine, Ser: 0.65 mg/dL (ref 0.44–1.00)
GFR calc non Af Amer: >60 mL/min (ref >60)
GLUCOSE: 158 mg/dL — AB (ref 70–99)
Potassium: 4.1 mmol/L (ref 3.5–5.1)
SODIUM: 139 mmol/L (ref 135–145)
Total Bilirubin: 0.9 mg/dL (ref 0.3–1.2)
Total Protein: 7.1 g/dL (ref 6.5–8.1)

## 2017-11-19 LAB — CBC
HCT: 34 % — ABNORMAL LOW (ref 35.0–47.0)
HEMOGLOBIN: 11.6 g/dL — AB (ref 12.0–16.0)
MCH: 28.5 pg (ref 26.0–34.0)
MCHC: 34.1 g/dL (ref 32.0–36.0)
MCV: 83.7 fL (ref 80.0–100.0)
Platelets: 74 10*3/uL — ABNORMAL LOW (ref 150–440)
RBC: 4.06 MIL/uL (ref 3.80–5.20)
RDW: 14.5 % (ref 11.5–14.5)
WBC: 3 10*3/uL — AB (ref 3.6–11.0)

## 2017-11-19 LAB — TROPONIN I

## 2017-11-19 MED ORDER — BUTALBITAL-APAP-CAFFEINE 50-325-40 MG PO TABS: 1.0000 | ORAL_TABLET | Freq: Four times a day (QID) | ORAL | 0 refills | Status: DC | PRN

## 2017-11-19 MED ORDER — METOCLOPRAMIDE HCL 5 MG/ML IJ SOLN
10.0000 mg | Freq: Once | INTRAMUSCULAR | Status: AC
  Administered 2017-11-19: 10 mg via INTRAVENOUS
  Filled 2017-11-19: qty 2

## 2017-11-19 MED ORDER — LORAZEPAM 2 MG/ML IJ SOLN
0.5000 mg | Freq: Once | INTRAMUSCULAR | Status: AC
  Administered 2017-11-19: 0.5 mg via INTRAVENOUS
  Filled 2017-11-19: qty 1

## 2017-11-19 NOTE — ED Triage Notes (Signed)
Pt c/o HA with HTN, nausea and SOB for the past 3-4 days. Pt is a/ox 4 today.

## 2017-11-19 NOTE — ED Notes (Signed)
Pt alert and oriented X4, active, cooperative, pt in NAD. RR even and unlabored, color WNL.  Pt informed to return if any life threatening symptoms occur.  Discharge and followup instructions reviewed.  

## 2017-11-19 NOTE — ED Provider Notes (Signed)
[facility] Emergency Department Provider Note       Time seen: ----------------------------------------- 8:16 AM on 11/19/2017 -----------------------------------------   I have reviewed the triage vital signs and the nursing notes.  HISTORY   Chief Complaint Hypertension    HPI Monique Edwards is a 52 y.o. female with a history of GERD, hypertension, morbid obesity who presents to the ED for headache with hypertension, nausea shortness of breath for the past 3 to 4 days.  Patient states she has been under a lot of stress, is not sleeping well at night.  Headache is frontal and she has currently some chest pressure.  She denies any recent illness or other complaints.  She has been taking her blood pressure medication as prescribed.  Past Medical History:  Diagnosis Date  . Fatty liver   . GERD (gastroesophageal reflux disease)   . Hypertension     Patient Active Problem List   Diagnosis Date Noted  . Morbid obesity (HCC) 10/22/2016  . GERD without esophagitis 10/22/2016  . Chronic pain of right knee 10/16/2016  . Paraspinal muscle spasm 10/16/2016  . Hypertension 10/16/2016  . Fall (on) (from) other stairs and steps, subsequent encounter 07/19/2016    Past Surgical History:  Procedure Laterality Date  . CHOLECYSTECTOMY      Allergies Patient has no known allergies.  Social History Social History   Tobacco Use  . Smoking status: Former Games developer  . Smokeless tobacco: Never Used  Substance Use Topics  . Alcohol use: No    Comment: former occassional  . Drug use: No   Review of Systems Constitutional: Negative for fever. Cardiovascular: Positive for chest pain Respiratory: Negative for shortness of breath. Gastrointestinal: Negative for abdominal pain, vomiting and diarrhea. Musculoskeletal: Negative for back pain. Skin: Negative for rash. Neurological: Positive for headache  All systems negative/normal/unremarkable except as stated in  the HPI  ____________________________________________   PHYSICAL EXAM:  VITAL SIGNS: ED Triage Vitals  Enc Vitals Group     BP 11/19/17 0811 (!) 171/86     Pulse Rate 11/19/17 0810 73     Resp 11/19/17 0810 18     Temp 11/19/17 0810 97.9 F (36.6 C)     Temp Source 11/19/17 0810 Oral     SpO2 11/19/17 0810 97 %     Weight 11/19/17 0811 260 lb (117.9 kg)     Height 11/19/17 0811 5\' 3"  (1.6 m)     Head Circumference --      Peak Flow --      Pain Score 11/19/17 0810 3     Pain Loc --      Pain Edu? --      Excl. in GC? --    Constitutional: Alert and oriented.  Anxious, no distress Eyes: Conjunctivae are normal. Normal extraocular movements. ENT   Head: Normocephalic and atraumatic.   Nose: No congestion/rhinnorhea.   Mouth/Throat: Mucous membranes are moist.   Neck: No stridor. Cardiovascular: Normal rate, regular rhythm. No murmurs, rubs, or gallops. Respiratory: Normal respiratory effort without tachypnea nor retractions. Breath sounds are clear and equal bilaterally. No wheezes/rales/rhonchi. Gastrointestinal: Soft and nontender. Normal bowel sounds Musculoskeletal: Nontender with normal range of motion in extremities. No lower extremity tenderness nor edema. Neurologic:  Normal speech and language. No gross focal neurologic deficits are appreciated.  Skin:  Skin is warm, dry and intact. No rash noted. Psychiatric: Mood and affect are normal. Speech and behavior are normal.  ____________________________________________  EKG: Interpreted by me.  Sinus rhythm rate 69 bpm, prolonged PR interval, normal QRS, normal QT  ____________________________________________  ED COURSE:  As part of my medical decision making, I reviewed the following data within the electronic MEDICAL RECORD NUMBER History obtained from family if available, nursing notes, old chart and ekg, as well as notes from prior ED visits. Patient presented for hypertension, headache and chest pain, we  will assess with labs and imaging as indicated at this time.   Procedures ____________________________________________   LABS (pertinent positives/negatives)  Labs Reviewed  CBC - Abnormal; Notable for the following components:      Result Value   WBC 3.0 (*)    Hemoglobin 11.6 (*)    HCT 34.0 (*)    Platelets 74 (*)    All other components within normal limits  COMPREHENSIVE METABOLIC PANEL - Abnormal; Notable for the following components:   Glucose, Bld 158 (*)    Calcium 8.5 (*)    All other components within normal limits  TROPONIN I    RADIOLOGY  Chest x-ray is unremarkable ____________________________________________  DIFFERENTIAL DIAGNOSIS   Anxiety, hypertension, unstable angina unlikely  FINAL ASSESSMENT AND PLAN  Hypertension   Plan: The patient had presented for multiple complaints including headache, hypertension, nausea and shortness of breath. Patient's labs are unremarkable. Patient's imaging did not reveal any acute process.  I feel her hypertension is likely reactive to stress or headache or not feeling well in general.  I do think she needs a sleep study but I would not increase her blood pressure medicine as her blood pressure has resolved here without any blood pressure management.   Ulice DashJohnathan E Javonta Gronau, MD   Note: This note was generated in part or whole with voice recognition software. Voice recognition is usually quite accurate but there are transcription errors that can and very often do occur. I apologize for any typographical errors that were not detected and corrected.     Emily FilbertWilliams, Kayleanna Lorman E, MD 11/19/17 1014

## 2017-12-10 ENCOUNTER — Telehealth: Payer: Self-pay | Admitting: Licensed Clinical Social Worker

## 2017-12-10 NOTE — Telephone Encounter (Signed)
Clinician reached out to the patient after leaving a message requesting appointments for both her and her boyfriend Melvin Curtis (ROI on file).  She explained that she has a 44% no show rate and her boyfriend has a 61% no show rate. She asked what the barrier is for her and her boyfriend getting to appointments. She explained that she will need to call in the future at least 24 to 48 hours in advance and leave a message to cancel her or his appointments. She explained that if no shows continue then she may be dismissed from the clinic. She explained that it would be up to the coordinator or director whether the no shows fees will have to be paid prior to their next appointments.   

## 2017-12-19 ENCOUNTER — Ambulatory Visit: Payer: Self-pay

## 2017-12-20 ENCOUNTER — Other Ambulatory Visit: Payer: Self-pay | Admitting: Family Medicine

## 2017-12-20 NOTE — Telephone Encounter (Signed)
[facility].   Erasmo DownerBacigalupo, Yehudis Monceaux M, MD, MPH [facility] 12/20/2017 1:35 PM

## 2018-01-10 ENCOUNTER — Other Ambulatory Visit: Payer: Self-pay | Admitting: Adult Health Nurse Practitioner

## 2018-01-15 ENCOUNTER — Ambulatory Visit: Payer: Self-pay

## 2018-01-20 ENCOUNTER — Other Ambulatory Visit: Payer: Self-pay | Admitting: Family Medicine

## 2018-01-20 NOTE — Telephone Encounter (Signed)
Will forward to [facility], where patient is seen.  Monique Edwards, Monique Edwards M, MD, MPH [facility] 01/20/2018 2:47 PM

## 2018-01-29 ENCOUNTER — Other Ambulatory Visit: Payer: Self-pay

## 2018-01-29 ENCOUNTER — Ambulatory Visit: Payer: Self-pay | Attending: Oncology | Admitting: *Deleted

## 2018-01-29 ENCOUNTER — Encounter: Payer: Self-pay | Admitting: *Deleted

## 2018-01-29 ENCOUNTER — Ambulatory Visit
Admission: RE | Admit: 2018-01-29 | Discharge: 2018-01-29 | Disposition: A | Discharge status: Home / Self Care | Payer: Self-pay | Source: Ambulatory Visit | Attending: Oncology | Admitting: Oncology

## 2018-01-29 ENCOUNTER — Encounter (INDEPENDENT_AMBULATORY_CARE_PROVIDER_SITE_OTHER): Payer: Self-pay

## 2018-01-29 VITALS — BP 139/92 | HR 98 | Temp 98.1°F | Ht 64.0 in | Wt 250.0 lb

## 2018-01-29 DIAGNOSIS — Z0000 Encounter for general adult medical examination without abnormal findings: Secondary | ICD-10-CM

## 2018-01-29 NOTE — Patient Instructions (Signed)
HPV Test The human papillomavirus (HPV) test is used to look for high-risk types of HPV infection. HPV is a group of about 100 viruses. Many of these viruses cause growths on, in, or around the genitals. Most HPV viruses cause infections that usually go away without treatment. However, HPV types 6, 11, 16, and 18 are considered high-risk types of HPV that can increase your risk of cancer of the cervix or anus if the infection is left untreated. An HPV test identifies the DNA (genetic) strands of the HPV infection, so it is also referred to as the HPV DNA test. Although HPV is found in both males and females, the HPV test is only used to screen for increased cancer risk in females:  With an abnormal Pap test.  After treatment of an abnormal Pap test.  Between the ages of 30 and 65.  After treatment of a high-risk HPV infection.  The HPV test may be done at the same time as a pelvic exam and Pap test in females over the age of 30. Both the HPV test and Pap test require a sample of cells from the cervix. How do I prepare for this test?  Do not douche or take a bath for 24-48 hours before the test or as directed by your health care provider.  Do not have sex for 24-48 hours before the test or as directed by your health care provider.  You may be asked to reschedule the test if you are menstruating.  You will be asked to urinate before the test. What do the results mean? It is your responsibility to obtain your test results. Ask the lab or department performing the test when and how you will get your results. Talk with your health care provider if you have any questions about your results. Your result will be negative or positive. Meaning of Negative Test Results A negative HPV test result means that no HPV was found, and it is very likely that you do not have HPV. Meaning of Positive Test Results A positive HPV test result indicates that you have HPV.  If your test result shows the presence  of any high-risk HPV strains, you may have an increased risk of developing cancer of the cervix or anus if the infection is left untreated.  If any low-risk HPV strains are found, you are not likely to have an increased risk of cancer.  Discuss your test results with your health care provider. He or she will use the results to make a diagnosis and determine a treatment plan that is right for you. Talk with your health care provider to discuss your results, treatment options, and if necessary, the need for more tests. Talk with your health care provider if you have any questions about your results. This information is not intended to replace advice given to you by your health care provider. Make sure you discuss any questions you have with your health care provider. Document Released: 05/18/2004 Document Revised: 12/28/2015 Document Reviewed: 09/08/2013 Elsevier Interactive Patient Education  2018 [employer]   Gave patient hand-out, Women Staying Healthy, Active and Well from [facility], with education on breast health, pap smears, heart and colon health.  

## 2018-01-29 NOTE — Progress Notes (Signed)
  Subjective:     Patient ID: Monique Edwards, female   DOB: 11-May-1965, 52 y.o.   MRN: [id number]  HPI   Review of Systems     Objective:   Physical Exam  Pulmonary/Chest: Right breast exhibits no inverted nipple, no mass, no nipple discharge, no skin change and no tenderness. Left breast exhibits no inverted nipple, no mass, no nipple discharge, no skin change and no tenderness. Breasts are symmetrical.    Abdominal:    Genitourinary: There is no rash, tenderness, lesion or injury on the right labia. There is no rash, tenderness, lesion or injury on the left labia. Cervix exhibits friability. Cervix exhibits no motion tenderness and no discharge. Right adnexum displays no mass, no tenderness and no fullness. Left adnexum displays no mass, no tenderness and no fullness. No erythema, tenderness or bleeding in the vagina. No foreign body in the vagina. No signs of injury around the vagina. No vaginal discharge found.         Assessment:     52 year old White female referred [facility] for clinical breast exam, pap and mammogram.  Clinical breast exam unremarkable.  Taught self breast awareness.  Specimen collected for pap smear without difficulty.  Patient has been screened for eligibility.  She does not have any insurance, Medicare or Medicaid.  She also meets financial eligibility.  Hand-out given on the Affordable Care Act. Risk Assessment    Risk Scores      01/29/2018   Last edited by: Scarlett Presto, RN   5-year risk: 0.8 %   Lifetime risk: 6.2 %             Plan:     Screening mammogram ordered.  Specimen for pap sent to the lab.  Will follow-up per [facility] protocol.

## 2018-01-30 ENCOUNTER — Ambulatory Visit: Payer: Self-pay | Admitting: Adult Health

## 2018-01-31 ENCOUNTER — Ambulatory Visit: Payer: Self-pay

## 2018-02-03 ENCOUNTER — Ambulatory Visit: Payer: Self-pay | Admitting: Pharmacy Technician

## 2018-02-03 NOTE — Progress Notes (Signed)
Patient scheduled for eligibility appointment at [facility].  Patient did not show for the appointment on February 03, 2018 at 9:00a.m.  Contacted patient.  Patient stated that she decided to sleep-in.  Patient stated that she is trying to obtain 2018 tax return.  I provided patient with IRS phone number to call to obtain transcript.  Patient stated that she did not want to call the IRS.  Was going to contact individual that completed her taxes and obtain a copy.    Patient rescheduled eligibility appt for 9:00a.m. on February 14, 2018.  Patient acknowledged that she understood that [facility] will be unable to provide medication assistance until eligibility is determined.  [name] Care Manager [facility]

## 2018-02-04 LAB — PAP LB AND HPV HIGH-RISK
HPV, HIGH-RISK: NEGATIVE
PAP SMEAR COMMENT: 0

## 2018-02-05 ENCOUNTER — Encounter: Payer: Self-pay | Admitting: *Deleted

## 2018-02-05 NOTE — Progress Notes (Signed)
Letter mailed to inform patient of her normal mammogram and pap results.  Next mammo in one year and pap smear in 5 years.

## 2018-02-11 ENCOUNTER — Telehealth: Payer: Self-pay | Admitting: Adult Health Nurse Practitioner

## 2018-02-11 NOTE — Telephone Encounter (Signed)
Called and rescheduled 10/10 appointment to 10/24. Patient was reminded of no-show fees and is going to bring in some of this next appointment.

## 2018-02-13 ENCOUNTER — Ambulatory Visit: Payer: Self-pay | Admitting: Adult Health

## 2018-02-14 ENCOUNTER — Ambulatory Visit: Payer: Self-pay

## 2018-02-14 ENCOUNTER — Other Ambulatory Visit: Payer: Self-pay | Admitting: Adult Health Nurse Practitioner

## 2018-02-17 ENCOUNTER — Ambulatory Visit: Payer: Self-pay | Admitting: Pharmacy Technician

## 2018-02-17 ENCOUNTER — Other Ambulatory Visit: Payer: Self-pay | Admitting: Pharmacy Technician

## 2018-02-17 NOTE — Progress Notes (Signed)
Patient had requested a phone consult due to transportation issues.  Contacted patient.  Patient stated that this was not a good time to talk.  Patient requested that all paperwork be mailed.  Putting [facility] application, [employer] Attestation in mail.  Patient has already given [facility] her 2018 tax return and proof of residence.  Patient stated that she does not have a checking account.  Patient does not want to reschedule eligibility appointment.  Patient will call when she is ready to reschedule eligibility appointment.  [name] Care Manager [facility]

## 2018-02-24 ENCOUNTER — Ambulatory Visit: Payer: Self-pay

## 2018-02-25 ENCOUNTER — Encounter: Payer: Self-pay | Admitting: Emergency Medicine

## 2018-02-25 ENCOUNTER — Emergency Department: Payer: Self-pay

## 2018-02-25 ENCOUNTER — Emergency Department
Admission: EM | Admit: 2018-02-25 | Discharge: 2018-02-25 | Disposition: A | Discharge status: Home / Self Care | Payer: Self-pay | Attending: Emergency Medicine | Admitting: Emergency Medicine

## 2018-02-25 ENCOUNTER — Other Ambulatory Visit: Payer: Self-pay

## 2018-02-25 DIAGNOSIS — J40 Bronchitis, not specified as acute or chronic: Secondary | ICD-10-CM | POA: Insufficient documentation

## 2018-02-25 DIAGNOSIS — Z79899 Other long term (current) drug therapy: Secondary | ICD-10-CM | POA: Insufficient documentation

## 2018-02-25 DIAGNOSIS — I10 Essential (primary) hypertension: Secondary | ICD-10-CM | POA: Insufficient documentation

## 2018-02-25 MED ORDER — KETOROLAC TROMETHAMINE 60 MG/2ML IM SOLN
60.0000 mg | Freq: Once | INTRAMUSCULAR | Status: AC
  Administered 2018-02-25: 60 mg via INTRAMUSCULAR
  Filled 2018-02-25: qty 2

## 2018-02-25 MED ORDER — PSEUDOEPH-BROMPHEN-DM 30-2-10 MG/5ML PO SYRP: 5.0000 mL | ORAL_SOLUTION | Freq: Four times a day (QID) | ORAL | 0 refills | Status: DC | PRN

## 2018-02-25 MED ORDER — IPRATROPIUM-ALBUTEROL 0.5-2.5 (3) MG/3ML IN SOLN
3.0000 mL | Freq: Once | RESPIRATORY_TRACT | Status: AC
Start: 2018-02-25 — End: 2018-02-25
  Administered 2018-02-25: 3 mL via RESPIRATORY_TRACT
  Filled 2018-02-25: qty 3

## 2018-02-25 MED ORDER — NAPROXEN 500 MG PO TABS: 500.0000 mg | ORAL_TABLET | Freq: Two times a day (BID) | ORAL | Status: DC

## 2018-02-25 NOTE — ED Triage Notes (Signed)
Pt states cold symptoms started on Sunday, headache, body aches, runny nose and cough, has been taking cold meds with no relief.

## 2018-02-25 NOTE — ED Notes (Signed)
See triage note  Presents with cough and cold sx's for the past few days  Cough present on arrival   afebrile

## 2018-02-25 NOTE — ED Provider Notes (Signed)
[facility] Emergency Department Provider Note   ____________________________________________   First MD Initiated Contact with Patient 02/25/18 [phone]     (approximate)  I have reviewed the triage vital signs and the nursing notes.   HISTORY  Chief Complaint Cough; Nasal Congestion; and Headache    HPI Monique Edwards is a 52 y.o. female patient complain of headache, body aches, nonproductive cough, and rhinorrhea for 3 days.  Patient states chest wall pain secondary to coughing.  Patient denies fever, nausea, vomiting, or diarrhea.  No palliative measures for complaint.  Patient rates her pain discomfort as a 3/10.  Patient described the pain is "achy".  It was noted patient blood pressure is elevated.  Patient states she took a medication for hypertension prior to arrival.  Past Medical History:  Diagnosis Date  . Fatty liver   . GERD (gastroesophageal reflux disease)   . Hypertension     Patient Active Problem List   Diagnosis Date Noted  . Morbid obesity (HCC) 10/22/2016  . GERD without esophagitis 10/22/2016  . Chronic pain of right knee 10/16/2016  . Paraspinal muscle spasm 10/16/2016  . Hypertension 10/16/2016  . Fall (on) (from) other stairs and steps, subsequent encounter 07/19/2016    Past Surgical History:  Procedure Laterality Date  . CHOLECYSTECTOMY      Prior to Admission medications   Medication Sig Start Date End Date Taking? Authorizing Provider  albuterol (PROVENTIL HFA;VENTOLIN HFA) 108 (90 Base) MCG/ACT inhaler Inhale 1 puff into the lungs every 4 (four) hours as needed for wheezing or shortness of breath. Patient not taking: Reported on 09/05/2017 08/22/17   Andreas Ohm, NP  brompheniramine-pseudoephedrine-DM 30-2-10 MG/5ML syrup Take 5 mLs by mouth 4 (four) times daily as needed. 02/25/18   Joni Reining, PA-C  butalbital-acetaminophen-caffeine (FIORICET, ESGIC) [phone] MG tablet Take 1-2 tablets by mouth every 6  (six) hours as needed for headache. 11/19/17 11/19/18  Emily Filbert, MD  ibuprofen (ADVIL,MOTRIN) 600 MG tablet TAKE 1 TABLET BY MOUTH EVERY 8 HOURS AS NEEDED 01/14/18   Doles-Johnson, Teah, NP  lactulose (CHRONULAC) 10 GM/15ML solution Take 15 mLs (10 g total) by mouth 2 (two) times daily as needed for mild constipation. Patient not taking: Reported on 09/05/2017 01/08/17   Michiel Cowboy A, PA-C  lisinopril (PRINIVIL,ZESTRIL) 40 MG tablet TAKE 1 TABLET BY MOUTH DAILY 01/23/18   Doles-Johnson, Teah, NP  naproxen (NAPROSYN) 500 MG tablet Take 1 tablet (500 mg total) by mouth 2 (two) times daily with a meal. 02/25/18   Joni Reining, PA-C  omeprazole (PRILOSEC) 20 MG capsule Take 1 capsule (20 mg total) by mouth daily. 08/22/17   Andreas Ohm, NP    Allergies Patient has no known allergies.  No family history on file.  Social History Social History   Tobacco Use  . Smoking status: Former Games developer  . Smokeless tobacco: Never Used  Substance Use Topics  . Alcohol use: No    Comment: former occassional  . Drug use: No    Review of Systems  Constitutional: No fever/chills Eyes: No visual changes. ENT: No sore throat. Cardiovascular: Denies chest pain. Respiratory: Denies shortness of breath. Gastrointestinal: No abdominal pain.  No nausea, no vomiting.  No diarrhea.  No constipation. Genitourinary: Negative for dysuria. Musculoskeletal: Negative for back pain. Skin: Negative for rash. Neurological: Negative for headaches, focal weakness or numbness. Endocrine:Hypertension  ____________________________________________   PHYSICAL EXAM:  VITAL SIGNS: ED Triage Vitals  Enc Vitals Group  BP 02/25/18 0905 (!) 175/95     Pulse Rate 02/25/18 0905 66     Resp 02/25/18 0905 20     Temp 02/25/18 0905 98.8 F (37.1 C)     Temp Source 02/25/18 0905 Oral     SpO2 02/25/18 0905 96 %     Weight 02/25/18 0912 260 lb (117.9 kg)     Height 02/25/18 0912 5\' 4"  (1.626 m)      Head Circumference --      Peak Flow --      Pain Score 02/25/18 0912 3     Pain Loc --      Pain Edu? --      Excl. in GC? --     Constitutional: Alert and oriented. Well appearing and in no acute distress.  Morbid obesity. Nose: Bilateral maxillary guarding, edematous nasal turbinates, and clear rhinorrhea. Mouth/Throat: Mucous membranes are moist.  Oropharynx non-erythematous.  Postnasal drainage. Neck: No stridor. Hematological/Lymphatic/Immunilogical: No cervical lymphadenopathy. Cardiovascular: Normal rate, regular rhythm. Grossly normal heart sounds.  Good peripheral circulation.  Elevated blood pressure. Respiratory: Normal respiratory effort.  No retractions. Lungs bilateral Rhonchi Neurologic:  Normal speech and language. No gross focal neurologic deficits are appreciated. No gait instability. Skin:  Skin is warm, dry and intact. No rash noted. Psychiatric: Mood and affect are normal. Speech and behavior are normal.  ____________________________________________   LABS (all labs ordered are listed, but only abnormal results are displayed)  Labs Reviewed - No data to display ____________________________________________  EKG   ____________________________________________  RADIOLOGY  ED MD interpretation:    Official radiology report(s): Dg Chest 2 View  Result Date: 02/25/2018 CLINICAL DATA:  Productive cough and congestion for 2 days. History of smoking and hypertension. EXAM: CHEST - 2 VIEW COMPARISON:  11/19/2017. FINDINGS: The heart size and mediastinal contours are stable. There is aortic atherosclerosis. Mild chronic central airway thickening is present. There is no edema, confluent airspace opacity, pleural effusion or pneumothorax. Mild thoracic spine degenerative changes are stable. IMPRESSION: Stable chest with mild chronic central airway thickening. No acute cardiopulmonary process. Electronically Signed   By: Carey Bullocks M.D.   On: 02/25/2018 10:03      ____________________________________________   PROCEDURES  Procedure(s) performed: None  Procedures  Critical Care performed: No  ____________________________________________   INITIAL IMPRESSION / ASSESSMENT AND PLAN / ED COURSE  As part of my medical decision making, I reviewed the following data within the electronic MEDICAL RECORD NUMBER    Patient presents with headache, body aches, nasal congestion of intermitting runny nose, and a nonproductive cough.  Discussed x-ray findings with patient consistent with bronchitis.  Patient given discharge care instructions and advised take medication as directed.  Patient given a work note for today and advised to follow-up PCP if condition persist.      ____________________________________________   FINAL CLINICAL IMPRESSION(S) / ED DIAGNOSES  Final diagnoses:  Bronchitis     ED Discharge Orders         Ordered    brompheniramine-pseudoephedrine-DM 30-2-10 MG/5ML syrup  4 times daily PRN     02/25/18 1011    naproxen (NAPROSYN) 500 MG tablet  2 times daily with meals     02/25/18 1011           Note:  This document was prepared using Dragon voice recognition software and may include unintentional dictation errors.    Joni Reining, PA-C 02/25/18 1014    Governor Rooks, MD 02/25/18 [phone]

## 2018-02-27 ENCOUNTER — Ambulatory Visit: Payer: Self-pay | Admitting: Adult Health

## 2018-02-27 ENCOUNTER — Encounter: Payer: Self-pay | Admitting: Adult Health

## 2018-02-27 VITALS — BP 140/74 | HR 64 | Temp 98.1°F | Ht 62.0 in | Wt 258.2 lb

## 2018-02-27 DIAGNOSIS — I10 Essential (primary) hypertension: Secondary | ICD-10-CM

## 2018-02-27 DIAGNOSIS — J42 Unspecified chronic bronchitis: Secondary | ICD-10-CM

## 2018-02-27 DIAGNOSIS — Z23 Encounter for immunization: Secondary | ICD-10-CM

## 2018-02-27 DIAGNOSIS — K219 Gastro-esophageal reflux disease without esophagitis: Secondary | ICD-10-CM

## 2018-02-27 DIAGNOSIS — R0982 Postnasal drip: Secondary | ICD-10-CM | POA: Insufficient documentation

## 2018-02-27 DIAGNOSIS — J209 Acute bronchitis, unspecified: Secondary | ICD-10-CM

## 2018-02-27 MED ORDER — LEVOFLOXACIN 750 MG PO TABS: 750.0000 mg | ORAL_TABLET | Freq: Every day | ORAL | 0 refills | Status: DC

## 2018-02-27 MED ORDER — LISINOPRIL 40 MG PO TABS: 40.0000 mg | ORAL_TABLET | Freq: Every day | ORAL | 2 refills | Status: DC

## 2018-02-27 MED ORDER — IBUPROFEN 600 MG PO TABS: 600.0000 mg | ORAL_TABLET | Freq: Three times a day (TID) | ORAL | 1 refills | Status: DC | PRN

## 2018-02-27 MED ORDER — BENZONATATE 100 MG PO CAPS: 100.0000 mg | ORAL_CAPSULE | Freq: Three times a day (TID) | ORAL | 0 refills | Status: DC

## 2018-02-27 MED ORDER — OMEPRAZOLE 20 MG PO CPDR: 20.0000 mg | DELAYED_RELEASE_CAPSULE | Freq: Every day | ORAL | 3 refills | Status: DC

## 2018-02-27 MED ORDER — PSEUDOEPH-BROMPHEN-DM 30-2-10 MG/5ML PO SYRP: 5.0000 mL | ORAL_SOLUTION | Freq: Four times a day (QID) | ORAL | 0 refills | Status: AC | PRN

## 2018-02-27 MED ORDER — LACTULOSE 10 GM/15ML PO SOLN: 10.0000 g | Freq: Two times a day (BID) | ORAL | 4 refills | Status: DC | PRN

## 2018-02-27 MED ORDER — SALINE SPRAY 0.65 % NA SOLN: 2.0000 | Freq: Four times a day (QID) | NASAL | 0 refills | Status: DC

## 2018-02-27 MED ORDER — PREDNISONE 10 MG (21) PO TBPK: ORAL_TABLET | ORAL | 0 refills | Status: DC

## 2018-02-27 NOTE — Progress Notes (Signed)
Patient: Monique Edwards Female    DOB: 1965/07/11   52 y.o.   MRN: [id number] Visit Date: 02/27/2018  Today's Provider: Shawn Route, NP   Chief Complaint  Patient presents with  . Follow-up    dx with bronchitis this week, requesting flu vax today   Subjective:    HPI This is a 52 year old female who presents for follow-up status post ED visit for acute bronchitis.  She presented to the ED with chest wall pain and cough but no fever.  She was diagnosed with bronchitis and advised to follow-up with her PCP.  She was given brompheniramine-pseudoephedrine-DM 30 and naproxen in the ED. she reports no improvement in symptoms.  Still complaining of intractable cough is productive of dark sputum, associated with chest tightness and dyspnea with exertion.  She was prescribed an albuterol inhaler but due to financial constraints she could not afford it.  She is a former smoker. Patient is also complaining of nausea that is occurs mostly in the morning.  She is on omeprazole 20 mg daily for acid reflux. She denies fever, and chills but reports persistent headache which she cannot further characterize.  She has been treated for migraines in the past.  She is currently taking ibuprofen as needed and reports moderate improvement with current dose. She also needs refills on her blood pressure and acid reflux medications.  No Known Allergies Previous Medications   ALBUTEROL (PROVENTIL HFA;VENTOLIN HFA) 108 (90 BASE) MCG/ACT INHALER    Inhale 1 puff into the lungs every 4 (four) hours as needed for wheezing or shortness of breath.   BROMPHENIRAMINE-PSEUDOEPHEDRINE-DM 30-2-10 MG/5ML SYRUP    Take 5 mLs by mouth 4 (four) times daily as needed.   BUTALBITAL-ACETAMINOPHEN-CAFFEINE (FIORICET, ESGIC) 50-325-40 MG TABLET    Take 1-2 tablets by mouth every 6 (six) hours as needed for headache.   IBUPROFEN (ADVIL,MOTRIN) 600 MG TABLET    TAKE 1 TABLET BY MOUTH EVERY 8 HOURS AS NEEDED   LACTULOSE (CHRONULAC)  10 GM/15ML SOLUTION    Take 15 mLs (10 g total) by mouth 2 (two) times daily as needed for mild constipation.   LISINOPRIL (PRINIVIL,ZESTRIL) 40 MG TABLET    TAKE 1 TABLET BY MOUTH DAILY   NAPROXEN (NAPROSYN) 500 MG TABLET    Take 1 tablet (500 mg total) by mouth 2 (two) times daily with a meal.   OMEPRAZOLE (PRILOSEC) 20 MG CAPSULE    Take 1 capsule (20 mg total) by mouth daily.    Review of Systems  Constitutional: Positive for fatigue. Negative for chills and fever.  HENT: Positive for congestion and postnasal drip.   Respiratory: Positive for cough and shortness of breath. Negative for wheezing.   Cardiovascular: Positive for chest pain. Negative for palpitations.  Gastrointestinal: Positive for nausea.  Allergic/Immunologic: Negative.   Neurological: Positive for headaches. Negative for light-headedness.    Social History   Tobacco Use  . Smoking status: Former Games developer  . Smokeless tobacco: Never Used  Substance Use Topics  . Alcohol use: No    Comment: former occassional   Objective:   BP 140/74 (BP Location: Right Arm, Patient Position: Sitting)   Pulse 64   Temp 98.1 F (36.7 C) (Oral)   Ht 5\' 2"  (1.575 m)   Wt 258 lb 3.2 oz (117.1 kg)   LMP 02/28/2014 (Approximate)   BMI 47.23 kg/m   Physical Exam  Constitutional: She is oriented to person, place, and time. She appears well-developed and well-nourished.  Appears acutely ill  Eyes: Pupils are equal, round, and reactive to light. Conjunctivae and EOM are normal.  Cardiovascular: Normal rate, normal heart sounds and intact distal pulses.  Pulmonary/Chest: Effort normal and breath sounds normal.  Breath sounds diminished in the bases  Abdominal: Soft. Bowel sounds are normal.  Neurological: She is alert and oriented to person, place, and time.  Skin: Skin is warm and dry.  Psychiatric: She has a normal mood and affect.  Nursing note and vitals reviewed.  Assessment & Plan:   1. Need for immunization against  influenza - Flu Vaccine QUAD 6+ mos IM (Fluarix)  2. Acute exacerbation of chronic bronchitis (HCC) Given that patient is a former smoker, will treat with empiric antibiotics Levaquin 750 mg 1 tablet by mouth daily for 5 days Tessalon Perles 100 mg 3 times daily x5 days for cough Prednisone taper Return to clinic in 1 week  3. Essential hypertension Well-controlled.  Medications refilled.  Low-salt diet encouraged  4. GERD without esophagitis Continue omeprazole 20 mg daily patient advised to take medication at bedtime given episodes of nausea in the morning Return to the clinic if worsening nausea  5. Morbid obesity (HCC) Weight loss advice given  6. Post-nasal drip Started on prednisone taper Recommended to use saline nasal spray as needed   Shawn Route, NP   [facility] [place]

## 2018-02-27 NOTE — Patient Instructions (Signed)

## 2018-03-04 ENCOUNTER — Encounter: Payer: Self-pay | Admitting: Emergency Medicine

## 2018-03-04 ENCOUNTER — Other Ambulatory Visit: Payer: Self-pay

## 2018-03-04 ENCOUNTER — Emergency Department
Admission: EM | Admit: 2018-03-04 | Discharge: 2018-03-04 | Disposition: A | Discharge status: Home / Self Care | Payer: Self-pay | Attending: Emergency Medicine | Admitting: Emergency Medicine

## 2018-03-04 ENCOUNTER — Emergency Department: Payer: Self-pay

## 2018-03-04 DIAGNOSIS — Z79899 Other long term (current) drug therapy: Secondary | ICD-10-CM | POA: Insufficient documentation

## 2018-03-04 DIAGNOSIS — R197 Diarrhea, unspecified: Secondary | ICD-10-CM | POA: Insufficient documentation

## 2018-03-04 DIAGNOSIS — D696 Thrombocytopenia, unspecified: Secondary | ICD-10-CM | POA: Insufficient documentation

## 2018-03-04 DIAGNOSIS — J40 Bronchitis, not specified as acute or chronic: Secondary | ICD-10-CM | POA: Insufficient documentation

## 2018-03-04 DIAGNOSIS — R05 Cough: Secondary | ICD-10-CM | POA: Insufficient documentation

## 2018-03-04 DIAGNOSIS — Z87891 Personal history of nicotine dependence: Secondary | ICD-10-CM | POA: Insufficient documentation

## 2018-03-04 DIAGNOSIS — I10 Essential (primary) hypertension: Secondary | ICD-10-CM | POA: Insufficient documentation

## 2018-03-04 LAB — COMPREHENSIVE METABOLIC PANEL
ALK PHOS: 48 U/L (ref 38–126)
ALT: 21 U/L (ref 0–44)
ANION GAP: 9 (ref 5–15)
AST: 24 U/L (ref 15–41)
Albumin: 3.7 g/dL (ref 3.5–5.0)
BILIRUBIN TOTAL: 0.4 mg/dL (ref 0.3–1.2)
BUN: 14 mg/dL (ref 6–20)
CALCIUM: 8.6 mg/dL — AB (ref 8.9–10.3)
CO2: 26 mmol/L (ref 22–32)
Chloride: 106 mmol/L (ref 98–111)
Creatinine, Ser: 0.78 mg/dL (ref 0.44–1.00)
GFR calc Af Amer: >60 mL/min (ref >60)
GLUCOSE: 99 mg/dL (ref 70–99)
Potassium: 3.7 mmol/L (ref 3.5–5.1)
Sodium: 141 mmol/L (ref 135–145)
TOTAL PROTEIN: 7.1 g/dL (ref 6.5–8.1)

## 2018-03-04 LAB — CBC
HEMATOCRIT: 33.9 % — AB (ref 36.0–46.0)
HEMOGLOBIN: 11.1 g/dL — AB (ref 12.0–15.0)
MCH: 28.5 pg (ref 26.0–34.0)
MCHC: 32.7 g/dL (ref 30.0–36.0)
MCV: 86.9 fL (ref 80.0–100.0)
Platelets: 73 10*3/uL — ABNORMAL LOW (ref 150–400)
RBC: 3.9 MIL/uL (ref 3.87–5.11)
RDW: 13.2 % (ref 11.5–15.5)
WBC: 4.1 10*3/uL (ref 4.0–10.5)
nRBC: 0 % (ref 0.0–0.2)

## 2018-03-04 MED ORDER — SODIUM CHLORIDE 0.9 % IV BOLUS
1000.0000 mL | Freq: Once | INTRAVENOUS | Status: AC
  Administered 2018-03-04: 1000 mL via INTRAVENOUS

## 2018-03-04 MED ORDER — ALBUTEROL SULFATE HFA 108 (90 BASE) MCG/ACT IN AERS: 2.0000 | INHALATION_SPRAY | Freq: Four times a day (QID) | RESPIRATORY_TRACT | 0 refills | Status: DC | PRN

## 2018-03-04 MED ORDER — METHYLPREDNISOLONE SODIUM SUCC 125 MG IJ SOLR
125.0000 mg | Freq: Once | INTRAMUSCULAR | Status: AC
  Administered 2018-03-04: 125 mg via INTRAVENOUS
  Filled 2018-03-04: qty 2

## 2018-03-04 MED ORDER — IPRATROPIUM-ALBUTEROL 0.5-2.5 (3) MG/3ML IN SOLN
3.0000 mL | Freq: Once | RESPIRATORY_TRACT | Status: AC
  Administered 2018-03-04: 3 mL via RESPIRATORY_TRACT
  Filled 2018-03-04: qty 3

## 2018-03-04 MED ORDER — PREDNISONE 10 MG PO TABS: ORAL_TABLET | ORAL | 0 refills | Status: DC

## 2018-03-04 NOTE — ED Provider Notes (Signed)
ED ECG REPORT I, Anne-Caroline Sharma Covert, the attending physician, personally viewed and interpreted this ECG.   Date: 03/04/2018  EKG Time: 1013  Rate: 52  Rhythm: sinus bradycardia  Axis: normal  Intervals:first-degree A-V block   ST&T Change: No STEMI    Rockne Menghini, MD 03/04/18 1016

## 2018-03-04 NOTE — ED Notes (Addendum)
See triage note  Presents with chest congestion and body aches  Sx's started about 1 week ago  Afebrile arrival

## 2018-03-04 NOTE — ED Provider Notes (Signed)
[facility] Emergency Department Provider Note  ____________________________________________  Time seen: Approximately 1:06 PM  I have reviewed the triage vital signs and the nursing notes.   HISTORY  Chief Complaint Nasal Congestion; Cough; and Diarrhea    HPI Monique Edwards is a 53 y.o. female presents emergency department for evaluation of nasal congestion and occasionally productive cough for 1 week.  Patient feels like she has to cough something up but has had difficulty doing so.  Patient was seen here 1 week ago and given a prescription for cough medicine.  She went to her primary care provider and was given a prescription for Levaquin.  Symptoms have not improved.  She had an episode of diarrhea this morning.  No fever, chills.  Past Medical History:  Diagnosis Date  . Fatty liver   . GERD (gastroesophageal reflux disease)   . Hypertension     Patient Active Problem List   Diagnosis Date Noted  . Post-nasal drip 02/27/2018  . Morbid obesity (HCC) 10/22/2016  . GERD without esophagitis 10/22/2016  . Chronic pain of right knee 10/16/2016  . Paraspinal muscle spasm 10/16/2016  . Hypertension 10/16/2016  . Fall (on) (from) other stairs and steps, subsequent encounter 07/19/2016    Past Surgical History:  Procedure Laterality Date  . CHOLECYSTECTOMY      Prior to Admission medications   Medication Sig Start Date End Date Taking? Authorizing Provider  albuterol (PROVENTIL HFA;VENTOLIN HFA) 108 (90 Base) MCG/ACT inhaler Inhale 2 puffs into the lungs every 6 (six) hours as needed for wheezing or shortness of breath. 03/04/18   Enid Derry, PA-C  benzonatate (TESSALON PERLES) 100 MG capsule Take 1 capsule (100 mg total) by mouth 3 (three) times daily. 02/27/18   Tukov-Yual, Alroy Bailiff, NP  brompheniramine-pseudoephedrine-DM 30-2-10 MG/5ML syrup Take 5 mLs by mouth 4 (four) times daily as needed for up to 5 days. 02/27/18 03/04/18  Tukov-Yual,  Alroy Bailiff, NP  ibuprofen (ADVIL,MOTRIN) 600 MG tablet Take 1 tablet (600 mg total) by mouth every 8 (eight) hours as needed. 02/27/18   Tukov-Yual, Alroy Bailiff, NP  lactulose (CHRONULAC) 10 GM/15ML solution Take 15 mLs (10 g total) by mouth 2 (two) times daily as needed for mild constipation. 02/27/18   Tukov-Yual, Alroy Bailiff, NP  levofloxacin (LEVAQUIN) 750 MG tablet Take 1 tablet (750 mg total) by mouth daily. 02/27/18   Tukov-Yual, Alroy Bailiff, NP  lisinopril (PRINIVIL,ZESTRIL) 40 MG tablet Take 1 tablet (40 mg total) by mouth daily. 02/27/18   Tukov-Yual, Alroy Bailiff, NP  omeprazole (PRILOSEC) 20 MG capsule Take 1 capsule (20 mg total) by mouth daily. 02/27/18   Tukov-Yual, Alroy Bailiff, NP  predniSONE (DELTASONE) 10 MG tablet Take 6 tablets day 1, take 5 tablets day 2, take 4 tablets day 3, take 3 tablets day 4, take 2 tablets day 5, take 1 tablet day 6 03/04/18   Enid Derry, PA-C  sodium chloride (OCEAN) 0.65 % SOLN nasal spray Place 2 sprays into both nostrils 4 (four) times daily for 5 days. 02/27/18 03/04/18  Andreas Ohm, NP    Allergies Patient has no known allergies.  No family history on file.  Social History Social History   Tobacco Use  . Smoking status: Former Games developer  . Smokeless tobacco: Never Used  Substance Use Topics  . Alcohol use: No    Comment: former occassional  . Drug use: No     Review of Systems  Constitutional: No fever/chills Eyes: No visual changes. No discharge.  ENT: Positive for congestion and rhinorrhea. Respiratory: Positive for cough.  Gastrointestinal: No abdominal pain.  No nausea, no vomiting.   Musculoskeletal: Negative for musculoskeletal pain. Skin: Negative for rash, abrasions, lacerations, ecchymosis.   ____________________________________________   PHYSICAL EXAM:  VITAL SIGNS: ED Triage Vitals  Enc Vitals Group     BP 03/04/18 0939 (!) 155/74     Pulse Rate 03/04/18 0939 63     Resp 03/04/18 0939 18      Temp 03/04/18 0939 (!) 97.5 F (36.4 C)     Temp Source 03/04/18 0939 Oral     SpO2 03/04/18 0939 99 %     Weight 03/04/18 0938 258 lb (117 kg)     Height 03/04/18 0938 5\' 2"  (1.575 m)     Head Circumference --      Peak Flow --      Pain Score 03/04/18 0936 3     Pain Loc --      Pain Edu? --      Excl. in GC? --      Constitutional: Alert and oriented. Well appearing and in no acute distress. Eyes: Conjunctivae are normal. PERRL. EOMI. No discharge. Head: Atraumatic. ENT: No frontal and maxillary sinus tenderness.      Ears: Tympanic membranes pearly gray with good landmarks. No discharge.      Nose: Mild congestion/rhinnorhea.      Mouth/Throat: Mucous membranes are moist. Oropharynx non-erythematous. Tonsils not enlarged. No exudates. Uvula midline. Neck: No stridor.   Hematological/Lymphatic/Immunilogical: No cervical lymphadenopathy. Cardiovascular: Normal rate, regular rhythm.  Good peripheral circulation. Respiratory: Normal respiratory effort without tachypnea or retractions. Lungs CTAB. Good air entry to the bases with no decreased or absent breath sounds. Gastrointestinal: Bowel sounds 4 quadrants. Soft and nontender to palpation. No guarding or rigidity. No palpable masses. No distention. Musculoskeletal: Full range of motion to all extremities. No gross deformities appreciated. Neurologic:  Normal speech and language. No gross focal neurologic deficits are appreciated.  Skin:  Skin is warm, dry and intact. No rash noted. Psychiatric: Mood and affect are normal. Speech and behavior are normal. Patient exhibits appropriate insight and judgement.   ____________________________________________   LABS (all labs ordered are listed, but only abnormal results are displayed)  Labs Reviewed  CBC - Abnormal; Notable for the following components:      Result Value   Hemoglobin 11.1 (*)    HCT 33.9 (*)    Platelets 73 (*)    All other components within normal limits   COMPREHENSIVE METABOLIC PANEL - Abnormal; Notable for the following components:   Calcium 8.6 (*)    All other components within normal limits   ____________________________________________  EKG   ____________________________________________  RADIOLOGY Lexine Baton, personally viewed and evaluated these images (plain radiographs) as part of my medical decision making, as well as reviewing the written report by the radiologist.  Dg Chest 2 View  Result Date: 03/04/2018 CLINICAL DATA:  Shortness of breath and cough EXAM: CHEST - 2 VIEW COMPARISON:  February 25, 2018 FINDINGS: There is no appreciable edema or consolidation. The heart size and pulmonary vascularity are normal. No adenopathy. There is aortic atherosclerosis. There is mild degenerative change in the thoracic spine. IMPRESSION: Aortic atherosclerosis.  No edema or consolidation. Aortic Atherosclerosis (ICD10-I70.0). Electronically Signed   By: Bretta Bang III M.D.   On: 03/04/2018 11:19    ____________________________________________    PROCEDURES  Procedure(s) performed:    Procedures    Medications  sodium chloride  0.9 % bolus 1,000 mL (0 mLs Intravenous Stopped 03/04/18 1242)  ipratropium-albuterol (DUONEB) 0.5-2.5 (3) MG/3ML nebulizer solution 3 mL (3 mLs Nebulization Given 03/04/18 1141)  methylPREDNISolone sodium succinate (SOLU-MEDROL) 125 mg/2 mL injection 125 mg (125 mg Intravenous Given 03/04/18 1417)     ____________________________________________   INITIAL IMPRESSION / ASSESSMENT AND PLAN / ED COURSE  Pertinent labs & imaging results that were available during my care of the patient were reviewed by me and considered in my medical decision making (see chart for details).  Review of the [place] CSRS was performed in accordance of the [facility] prior to dispensing any controlled drugs.   Patient's diagnosis is consistent with bronchitis. Vital signs and exam are reassuring.  Symptoms improved  after DuoNeb.  chest x-ray negative for acute cardiopulmonary processes.  No increased WBC on CBC.  Platelets were low at 73.  Patient denies any noticeable problems with bruising, bleeding.  Platelets are consistent with previous CBCs.  She will follow-up with primary care regarding this.  IM Solu-Medrol was given.  Patient appears well and is staying well hydrated. Patient feels comfortable going home. Patient will be discharged home with prescriptions for prednisone and albuterol inhaler. Patient is to follow up with primary care as needed or otherwise directed. Patient is given ED precautions to return to the ED for any worsening or new symptoms.     ____________________________________________  FINAL CLINICAL IMPRESSION(S) / ED DIAGNOSES  Final diagnoses:  Bronchitis  Thrombocytopenia (HCC)      NEW MEDICATIONS STARTED DURING THIS VISIT:  ED Discharge Orders         Ordered    predniSONE (DELTASONE) 10 MG tablet     03/04/18 1402    albuterol (PROVENTIL HFA;VENTOLIN HFA) 108 (90 Base) MCG/ACT inhaler  Every 6 hours PRN     03/04/18 1402              This chart was dictated using voice recognition software/Dragon. Despite best efforts to proofread, errors can occur which can change the meaning. Any change was purely unintentional.    Enid Derry, PA-C 03/04/18 1541    Arnaldo Natal, MD 03/04/18 [phone]

## 2018-03-04 NOTE — Discharge Instructions (Addendum)
Please follow-up with primary care this week for reevaluation of your viral respiratory infection.  Please follow-up with them for mildly low platelet count in ED for further work-up.

## 2018-03-04 NOTE — ED Notes (Signed)
EKG completed in triage.

## 2018-03-04 NOTE — ED Triage Notes (Signed)
Pt states chest congestion, body aches for over a week now, has been treated with antibiotic, diarrhea started this am. Appears in NAD.

## 2018-03-13 ENCOUNTER — Ambulatory Visit: Payer: Self-pay | Admitting: Adult Health

## 2018-03-13 ENCOUNTER — Encounter: Payer: Self-pay | Admitting: Adult Health

## 2018-03-13 DIAGNOSIS — K219 Gastro-esophageal reflux disease without esophagitis: Secondary | ICD-10-CM

## 2018-03-13 DIAGNOSIS — I10 Essential (primary) hypertension: Secondary | ICD-10-CM

## 2018-03-13 DIAGNOSIS — K760 Fatty (change of) liver, not elsewhere classified: Secondary | ICD-10-CM

## 2018-03-13 DIAGNOSIS — F17200 Nicotine dependence, unspecified, uncomplicated: Secondary | ICD-10-CM

## 2018-03-13 DIAGNOSIS — J42 Unspecified chronic bronchitis: Secondary | ICD-10-CM

## 2018-03-13 DIAGNOSIS — J209 Acute bronchitis, unspecified: Secondary | ICD-10-CM

## 2018-03-13 MED ORDER — BENZONATATE 100 MG PO CAPS: 100.0000 mg | ORAL_CAPSULE | Freq: Three times a day (TID) | ORAL | 0 refills | Status: DC

## 2018-03-13 MED ORDER — OMEPRAZOLE 20 MG PO CPDR: 20.0000 mg | DELAYED_RELEASE_CAPSULE | Freq: Every day | ORAL | 3 refills | Status: DC

## 2018-03-13 MED ORDER — TIOTROPIUM BROMIDE MONOHYDRATE 18 MCG IN CAPS: 18.0000 ug | ORAL_CAPSULE | Freq: Every day | RESPIRATORY_TRACT | 12 refills | Status: DC

## 2018-03-13 MED ORDER — PROBIOTIC DAILY PO CAPS: 1.0000 | ORAL_CAPSULE | Freq: Two times a day (BID) | ORAL | 0 refills | Status: DC

## 2018-03-13 MED ORDER — AMOXICILLIN-POT CLAVULANATE 875-125 MG PO TABS: 1.0000 | ORAL_TABLET | Freq: Two times a day (BID) | ORAL | 0 refills | Status: DC

## 2018-03-13 MED ORDER — ALBUTEROL SULFATE HFA 108 (90 BASE) MCG/ACT IN AERS: 2.0000 | INHALATION_SPRAY | RESPIRATORY_TRACT | 3 refills | Status: DC | PRN

## 2018-03-13 MED ORDER — AMLODIPINE BESYLATE 10 MG PO TABS: 10.0000 mg | ORAL_TABLET | Freq: Every day | ORAL | 2 refills | Status: DC

## 2018-03-13 NOTE — Progress Notes (Signed)
Patient: Monique Edwards Female    DOB: [birth year]   52 y.o.   MRN: [id number] Visit Date: 03/13/2018  Today's Provider: Shawn Route, NP   Chief Complaint  Patient presents with  . Follow-up    continues with productive 'darkish yellow'cough, reports no fevers   Subjective:    HPI 52 year old female, current smoker who presents with persistent productive cough.  She was seen in the emergency room on 03/03/2018 and treated for acute bronchitis.  She was treated for similar symptoms in the office prior to the her ED visit.  She was given a dose of Levaquin, Tessalon Perles and prednisone taper.  At the ED, she was treated with IV fluids duo nebs and Solu-Medrol.  Her chest x-ray was negative and her labs were unremarkable.  She states that cough is productive of yellow sputum, worse in the morning and associated with severe nasal and sinus congestion.  She continues to smoke but states that he has not been smoking every day.  He reports chest tightness and shortness of breath with exertion.  EKG in the ED was normal.  He has no known history of CAD but she does have recent lactose.  Her platelet count has been.  She has a history of low back and fatty liver.  She has not seen a gastroenterologist.  Last CT abdomen showed cirrhosis, splenomegaly with portal venous hypertension but ascites.    Not on File Previous Medications   IBUPROFEN (ADVIL,MOTRIN) 600 MG TABLET    Take 1 tablet (600 mg total) by mouth every 8 (eight) hours as needed.   LACTULOSE (CHRONULAC) 10 GM/15ML SOLUTION    Take 15 mLs (10 g total) by mouth 2 (two) times daily as needed for mild constipation.   SODIUM CHLORIDE (OCEAN) 0.65 % SOLN NASAL SPRAY    Place 2 sprays into both nostrils 4 (four) times daily for 5 days.    Review of Systems  Constitutional: Positive for fatigue.  HENT: Positive for congestion and postnasal drip.   Respiratory: Positive for cough, chest tightness, shortness of breath and wheezing.    Gastrointestinal: Positive for nausea. Negative for vomiting.  Endocrine: Negative for polydipsia, polyphagia and polyuria.  Musculoskeletal: Positive for arthralgias.  Neurological: Negative.     Social History   Tobacco Use  . Smoking status: Former Games developer  . Smokeless tobacco: Never Used  Substance Use Topics  . Alcohol use: No    Comment: former occassional   Objective:   BP 130/80 (BP Location: Left Arm, Patient Position: Sitting)   Temp 98.3 F (36.8 C)   Ht 5\' 2"  (1.575 m)   Wt 264 lb 3.2 oz (119.8 kg)   LMP 02/28/2014 (Approximate)   BMI 48.32 kg/m   Physical Exam  Constitutional: She is oriented to person, place, and time. She appears well-developed and well-nourished.  HENT:  Pain with palpation of frontal sinuses  Eyes: Conjunctivae and EOM are normal.  Cardiovascular: Normal rate.  Pulmonary/Chest: Effort normal. No respiratory distress. She has wheezes.  Breath sounds diminished in the bases bilaterally  Abdominal: Soft. Bowel sounds are normal.  Musculoskeletal: Normal range of motion.  Neurological: She is alert and oriented to person, place, and time.  Skin: Skin is warm and dry.  Nursing note and vitals reviewed.     Assessment & Plan:  1. Morbid obesity (HCC) Weight loss advice given. - HgB A1c - Lipid Profile - B Nat Peptide - Magnesium - Phosphorus - TSH  2. Fatty liver Continue  lactulose.  Will refer to gastroenterology for further evaluation - Ambulatory referral to Gastroenterology   3. Acute exacerbation of chronic bronchitis (HCC) Symptoms.  Given patient's long history of smoking, will obtain CT of the chest.  Will treat with Augmentin 8.5 mg twice a day x7 days and Tessalon Perles 100 mg 3 times daily.  Patient advised to return to the clinic if symptoms get worse.  [facility] care application given for CT chest. - CT Chest Wo Contrast; Future  4. GERD without esophagitis Continue PPI.  5. Essential hypertension Blood pressures  well controlled.  However, patient is coughing on lisinopril.  Will DC lisinopril and start on amlodipine 10 mg daily.  Patient is to monitor her blood pressure and document with them to her next visit.  6. Tobacco use disorder Smoking cessation strongly recommended.  Referral to smoking cessation program - Ambulatory referral to Smoking Cessation Program  Shawn Route, NP   [facility]

## 2018-03-14 ENCOUNTER — Other Ambulatory Visit: Payer: Self-pay

## 2018-03-14 DIAGNOSIS — K219 Gastro-esophageal reflux disease without esophagitis: Secondary | ICD-10-CM

## 2018-03-14 DIAGNOSIS — K7460 Unspecified cirrhosis of liver: Secondary | ICD-10-CM

## 2018-03-14 DIAGNOSIS — B182 Chronic viral hepatitis C: Secondary | ICD-10-CM

## 2018-03-14 NOTE — Progress Notes (Signed)
GI

## 2018-03-17 ENCOUNTER — Other Ambulatory Visit: Payer: Self-pay

## 2018-03-17 ENCOUNTER — Ambulatory Visit: Payer: Self-pay | Admitting: Pharmacist

## 2018-03-17 ENCOUNTER — Encounter: Payer: Self-pay | Admitting: Pharmacist

## 2018-03-17 ENCOUNTER — Ambulatory Visit: Payer: Self-pay | Admitting: Pharmacy Technician

## 2018-03-17 VITALS — BP 120/70 | Ht 62.0 in

## 2018-03-17 DIAGNOSIS — Z79899 Other long term (current) drug therapy: Secondary | ICD-10-CM

## 2018-03-17 NOTE — Progress Notes (Signed)
[facility]ication Management Clinic Visit Note  Patient: Monique Edwards MRN: [id number] Date of Birth: February 08, 1966 PCP: Virl Axe, MD   Lennox Laity 52 y.o. female presents for her initial medication management review with the pharmacist today. She was recently enrolled into our medication assistance program.  BP 120/70 (BP Location: Right Arm, Patient Position: Sitting, Cuff Size: Large)   Ht 5\' 2"  (1.575 m)   LMP 02/28/2014 (Approximate)   BMI 48.32 kg/m   Patient Information   Past Medical History:  Diagnosis Date  . Fatty liver   . GERD (gastroesophageal reflux disease)   . Hypertension       Past Surgical History:  Procedure Laterality Date  . CHOLECYSTECTOMY       Family History  Problem Relation Age of Onset  . Hypertension Mother   . Stroke Mother   . Diabetes Father   . Arthritis Sister   . Thyroid disease Sister   . Hypertension Brother   . Hypertension Brother     New Diagnoses (since last visit):   Family Support: Good  Lifestyle Diet: Breakfast: Dione Plover burritos  Lunch: Janifer Adie or soup  Dinner: Varies Drinks: Water, diet Coke or diet Dr. Reino Kent, coffee    Current Exercise Habits: The patient does not participate in regular exercise at present       Social History   Substance and Sexual Activity  Alcohol Use No   Comment: former occassional      Social History   Tobacco Use  Smoking Status Former Smoker  Smokeless Tobacco Never Used  Tobacco Comment   occasionally      Health Maintenance  Topic Date Due  . HIV Screening  03/26/1979  . TETANUS/TDAP  03/26/1983  . COLONOSCOPY  03/25/2014  . MAMMOGRAM  01/30/2020  . PAP SMEAR  01/29/2021  . INFLUENZA VACCINE  Completed  . Hepatitis C Screening  Completed   Health Maintenance/Date Completed  Last ED visit: 03/04/18  Last Visit to PCP: 03/13/18 Next Visit to PCP: 03/27/18   Outpatient Encounter Medications as of 03/17/2018  Medication Sig  . albuterol (PROVENTIL  HFA;VENTOLIN HFA) 108 (90 Base) MCG/ACT inhaler Inhale 2 puffs into the lungs every 4 (four) hours as needed for wheezing or shortness of breath.  Marland Kitchen amLODipine (NORVASC) 10 MG tablet Take 1 tablet (10 mg total) by mouth daily.  Marland Kitchen amoxicillin-clavulanate (AUGMENTIN) 875-125 MG tablet Take 1 tablet by mouth 2 (two) times daily.  . benzonatate (TESSALON PERLES) 100 MG capsule Take 1 capsule (100 mg total) by mouth 3 (three) times daily.  Marland Kitchen ibuprofen (ADVIL,MOTRIN) 600 MG tablet Take 1 tablet (600 mg total) by mouth every 8 (eight) hours as needed.  . lactulose (CHRONULAC) 10 GM/15ML solution Take 15 mLs (10 g total) by mouth 2 (two) times daily as needed for mild constipation.  Marland Kitchen omeprazole (PRILOSEC) 20 MG capsule Take 1 capsule (20 mg total) by mouth daily.  . sodium chloride (OCEAN) 0.65 % SOLN nasal spray Place 2 sprays into both nostrils 4 (four) times daily for 5 days.  Marland Kitchen tiotropium (SPIRIVA) 18 MCG inhalation capsule Place 1 capsule (18 mcg total) into inhaler and inhale daily. (Patient not taking: Reported on 03/17/2018)  . [DISCONTINUED] Probiotic Product (PROBIOTIC DAILY) CAPS Take 1 tablet by mouth 2 (two) times daily.   No facility-administered encounter medications on file as of 03/17/2018.    ASSESSMENT  Compliance: Takes medications as prescribed. Patient was able to verbalize how to take each medication and the indication.  Chronic Bronchitis: Ventolin HFA Last seen in the ED 03/04/18 for acute bronchitis (Augmentin, benzonatate, Spiriva, Ventolin). The Spiriva has to be ordered through the patient assistance program and ships to the patient's home. Since the patient will be moving, [facility] will recommend Incruse Ellipta to replace the Spiriva.  Counseled on how to use the Ventolin and the potential side effects.  GERD: omeprazole Takes medication in the morning. Her provider recommended taking in the evening when her symptoms are worse, however, she forgets to take the medication. C/o  symptoms of nausea, she will discuss next week at her appointment with [facility].  BP: amlodipine Previously on lisinopril and developed a cough. BP within normal limits today, 120/70 mmHg.  Arthritis: ibuprofen Arthritis in knees. Ibuprofen provides minimal relief. Not currently exercising.    PLAN Return to clinic in 6 months    Keirstan Iannello K. Joelene Millin, PharmD [facility]-Pharmacy Operations Coordinator [phone]

## 2018-03-19 ENCOUNTER — Other Ambulatory Visit: Payer: Self-pay

## 2018-03-20 LAB — MAGNESIUM: Magnesium: 2 mg/dL (ref 1.6–2.3)

## 2018-03-20 LAB — LIPID PANEL
Chol/HDL Ratio: 2.8 ratio (ref 0.0–4.4)
Cholesterol, Total: 150 mg/dL (ref 100–199)
HDL: 54 mg/dL (ref >39)
LDL Calculated: 73 mg/dL (ref 0–99)
TRIGLYCERIDES: 114 mg/dL (ref 0–149)
VLDL Cholesterol Cal: 23 mg/dL (ref 5–40)

## 2018-03-20 LAB — PHOSPHORUS: PHOSPHORUS: 2.7 mg/dL (ref 2.5–4.5)

## 2018-03-20 LAB — TSH: TSH: 2.13 u[IU]/mL (ref 0.450–4.500)

## 2018-03-20 LAB — BRAIN NATRIURETIC PEPTIDE: BNP: 18.6 pg/mL (ref 0.0–100.0)

## 2018-03-20 LAB — HEMOGLOBIN A1C
Est. average glucose Bld gHb Est-mCnc: 117 mg/dL
HEMOGLOBIN A1C: 5.7 % — AB (ref 4.8–5.6)

## 2018-03-21 NOTE — Progress Notes (Signed)
Completed [facility]ication Management Clinic application and contract. Patient agreed to all terms of the [facility] contract.   Patient approved to receive medication assistance at [facility] as long as eligibility criteria continues to be met.    Provided patient with community resource material based on her particular needs.    Ventolin & Ellipta Prescription Applications completed with patient.  Forwarded to [facility] for signature.  Upon receipt of signed application from provider, Ventolin & Ellipta Prescription Application will be submitted to [place].  [facility]

## 2018-03-24 ENCOUNTER — Encounter: Payer: Self-pay | Admitting: *Deleted

## 2018-03-27 ENCOUNTER — Ambulatory Visit: Payer: Self-pay | Admitting: Adult Health

## 2018-04-01 ENCOUNTER — Encounter: Payer: Self-pay | Admitting: Gerontology

## 2018-04-01 ENCOUNTER — Ambulatory Visit: Payer: Self-pay | Admitting: Gerontology

## 2018-04-01 ENCOUNTER — Other Ambulatory Visit: Payer: Self-pay

## 2018-04-01 VITALS — BP 129/76 | HR 73 | Temp 97.6°F

## 2018-04-01 DIAGNOSIS — K219 Gastro-esophageal reflux disease without esophagitis: Secondary | ICD-10-CM

## 2018-04-01 DIAGNOSIS — K760 Fatty (change of) liver, not elsewhere classified: Secondary | ICD-10-CM

## 2018-04-01 DIAGNOSIS — R0602 Shortness of breath: Secondary | ICD-10-CM

## 2018-04-01 DIAGNOSIS — I10 Essential (primary) hypertension: Secondary | ICD-10-CM

## 2018-04-01 DIAGNOSIS — J209 Acute bronchitis, unspecified: Secondary | ICD-10-CM

## 2018-04-01 DIAGNOSIS — J42 Unspecified chronic bronchitis: Secondary | ICD-10-CM

## 2018-04-01 NOTE — Progress Notes (Signed)
Patient: Monique Edwards Female    DOB: [birth year]   52 y.o.   MRN: [id number] Visit Date: 04/01/2018  Today's Provider: Rolm Galahioma E Kelsy Polack, NP   Chief Complaint  Patient presents with  . Follow-up    bronchitis   Subjective:    HPI Monique Edwards 52 y/o female presents for follow up on bronchitis, states that she still experiences non productive intermittent cough, and she reports that cough has improved 80 %, and continues to take benzonatate as needed. She continues to take Albuterol as needed for shortness of breath. Denies fever, chills, wheezes.   She reports taking Amlodipine 10 mg daily for hypertension and doesn't monitor BP at home, denies peripheral edema, headache and continues on weight loss regimen, and low salt diet.  She admits taking Omeprazole 20 mg daily for acid reflux with relief, and has stopped smoking since her last visit. Otherwise, she reports doing well, denies chest pain, palpitation and abdominal pain. Lab was reviewed, HgbA1c was 5.7 %.         Allergies  Allergen Reactions  . Lisinopril Cough   Previous Medications   ALBUTEROL (PROVENTIL HFA;VENTOLIN HFA) 108 (90 BASE) MCG/ACT INHALER    Inhale 2 puffs into the lungs every 4 (four) hours as needed for wheezing or shortness of breath.   AMLODIPINE (NORVASC) 10 MG TABLET    Take 1 tablet (10 mg total) by mouth daily.   BENZONATATE (TESSALON PERLES) 100 MG CAPSULE    Take 1 capsule (100 mg total) by mouth 3 (three) times daily.   IBUPROFEN (ADVIL,MOTRIN) 600 MG TABLET    Take 1 tablet (600 mg total) by mouth every 8 (eight) hours as needed.   LACTULOSE (CHRONULAC) 10 GM/15ML SOLUTION    Take 15 mLs (10 g total) by mouth 2 (two) times daily as needed for mild constipation.   OMEPRAZOLE (PRILOSEC) 20 MG CAPSULE    Take 1 capsule (20 mg total) by mouth daily.   SODIUM CHLORIDE (OCEAN) 0.65 % SOLN NASAL SPRAY    Place 2 sprays into both nostrils 4 (four) times daily for 5 days.   TIOTROPIUM (SPIRIVA) 18 MCG  INHALATION CAPSULE    Place 1 capsule (18 mcg total) into inhaler and inhale daily.    Review of Systems  Constitutional: Negative.   HENT: Negative.   Eyes: Negative.   Respiratory: Positive for shortness of breath (with exertion ).   Cardiovascular: Negative.   Gastrointestinal: Negative.   Genitourinary: Negative.   Musculoskeletal: Positive for arthralgias (knees and hip).  Skin: Negative.   Neurological: Negative.  Negative for light-headedness.  Psychiatric/Behavioral: Negative.     Social History   Tobacco Use  . Smoking status: Former Games developermoker  . Smokeless tobacco: Never Used  . Tobacco comment: occasionally  Substance Use Topics  . Alcohol use: No    Comment: former occassional   Objective:   BP 129/76 (BP Location: Left Wrist, Patient Position: Sitting)   Pulse 73   Temp 97.6 F (36.4 C)   LMP 02/28/2014 (Approximate)   SpO2 96%   Physical Exam  Constitutional: She is oriented to person, place, and time. She appears well-developed and well-nourished.  HENT:  Head: Normocephalic and atraumatic.  Eyes: Pupils are equal, round, and reactive to light. EOM are normal.  Neck: Normal range of motion.  Cardiovascular: Normal rate and regular rhythm.  Pulmonary/Chest: Effort normal and breath sounds normal.  Abdominal: Soft. Bowel sounds are normal.  Musculoskeletal: Normal range of motion.  Neurological: She is  alert and oriented to person, place, and time.  Skin: Skin is warm and dry.  Psychiatric: She has a normal mood and affect. Her behavior is normal. Judgment and thought content normal.        Assessment & Plan:     1. Acute exacerbation of chronic bronchitis (HCC) Stable, she is to continue using Albuterol inhaler as needed and will follow up with medication management for Spiriva and Benzonatate for cough as needed.  2. Essential hypertension Controlled, BP 129/76, continue Amlodipine 10 mg daily. Monitor BP and keep log and continue on low salt diet  and weight loss regimen.  3. GERD without esophagitis Continue to take 20 mg Omeprazole daily.  4. Fatty liver - Continues to take Lactulose for constipation as needed will follow up with - Ambulatory referral to Gastroenterology for Fatty liver evaluation and management.  5. Morbid obesity (HCC) - She is to continue on weight loss regimen, exercise 30 minutes daily.  6. Shortness of breath - Continue to use albuterol inhaler as needed and follow up with medication management for Spiriva  7. Health Care Maintenance: - Stool card provided for colon cancer screening -Rapid Hiv screening flyer provided. - She was advised to start low carb diet, exercise 30 minutes for hgbA1c of 5.7 %. -Follow up in 6 months       Arial Galligan Trellis Paganini, NP   [facility] [place]

## 2018-04-01 NOTE — Patient Instructions (Signed)
DASH Eating Plan DASH stands for "Dietary Approaches to Stop Hypertension." The DASH eating plan is a healthy eating plan that has been shown to reduce high blood pressure (hypertension). It may also reduce your risk for type 2 diabetes, heart disease, and stroke. The DASH eating plan may also help with weight loss. What are tips for following this plan? General guidelines  Avoid eating more than 2,300 mg (milligrams) of salt (sodium) a day. If you have hypertension, you may need to reduce your sodium intake to 1,500 mg a day.  Limit alcohol intake to no more than 1 drink a day for nonpregnant women and 2 drinks a day for men. One drink equals 12 oz of beer, 5 oz of wine, or 1 oz of hard liquor.  Work with your health care provider to maintain a healthy body weight or to lose weight. Ask what an ideal weight is for you.  Get at least 30 minutes of exercise that causes your heart to beat faster (aerobic exercise) most days of the week. Activities may include walking, swimming, or biking.  Work with your health care provider or diet and nutrition specialist (dietitian) to adjust your eating plan to your individual calorie needs. Reading food labels  Check food labels for the amount of sodium per serving. Choose foods with less than 5 percent of the Daily Value of sodium. Generally, foods with less than 300 mg of sodium per serving fit into this eating plan.  To find whole grains, look for the word "whole" as the first word in the ingredient list. Shopping  Buy products labeled as "low-sodium" or "no salt added."  Buy fresh foods. Avoid canned foods and premade or frozen meals. Cooking  Avoid adding salt when cooking. Use salt-free seasonings or herbs instead of table salt or sea salt. Check with your health care provider or pharmacist before using salt substitutes.  Do not fry foods. Cook foods using healthy methods such as baking, boiling, grilling, and broiling instead.  Cook with  heart-healthy oils, such as olive, canola, soybean, or sunflower oil. Meal planning   Eat a balanced diet that includes: ? 5 or more servings of fruits and vegetables each day. At each meal, try to fill half of your plate with fruits and vegetables. ? Up to 6-8 servings of whole grains each day. ? Less than 6 oz of lean meat, poultry, or fish each day. A 3-oz serving of meat is about the same size as a deck of cards. One egg equals 1 oz. ? 2 servings of low-fat dairy each day. ? A serving of nuts, seeds, or beans 5 times each week. ? Heart-healthy fats. Healthy fats called Omega-3 fatty acids are found in foods such as flaxseeds and coldwater fish, like sardines, salmon, and mackerel.  Limit how much you eat of the following: ? Canned or prepackaged foods. ? Food that is high in trans fat, such as fried foods. ? Food that is high in saturated fat, such as fatty meat. ? Sweets, desserts, sugary drinks, and other foods with added sugar. ? Full-fat dairy products.  Do not salt foods before eating.  Try to eat at least 2 vegetarian meals each week.  Eat more home-cooked food and less restaurant, buffet, and fast food.  When eating at a restaurant, ask that your food be prepared with less salt or no salt, if possible. What foods are recommended? The items listed may not be a complete list. Talk with your dietitian about what   dietary choices are best for you. Grains Whole-grain or whole-wheat bread. Whole-grain or whole-wheat pasta. Brown rice. Oatmeal. Quinoa. Bulgur. Whole-grain and low-sodium cereals. Pita bread. Low-fat, low-sodium crackers. Whole-wheat flour tortillas. Vegetables Fresh or frozen vegetables (raw, steamed, roasted, or grilled). Low-sodium or reduced-sodium tomato and vegetable juice. Low-sodium or reduced-sodium tomato sauce and tomato paste. Low-sodium or reduced-sodium canned vegetables. Fruits All fresh, dried, or frozen fruit. Canned fruit in natural juice (without  added sugar). Meat and other protein foods Skinless chicken or turkey. Ground chicken or turkey. Pork with fat trimmed off. Fish and seafood. Egg whites. Dried beans, peas, or lentils. Unsalted nuts, nut butters, and seeds. Unsalted canned beans. Lean cuts of beef with fat trimmed off. Low-sodium, lean deli meat. Dairy Low-fat (1%) or fat-free (skim) milk. Fat-free, low-fat, or reduced-fat cheeses. Nonfat, low-sodium ricotta or cottage cheese. Low-fat or nonfat yogurt. Low-fat, low-sodium cheese. Fats and oils Soft margarine without trans fats. Vegetable oil. Low-fat, reduced-fat, or light mayonnaise and salad dressings (reduced-sodium). Canola, safflower, olive, soybean, and sunflower oils. Avocado. Seasoning and other foods Herbs. Spices. Seasoning mixes without salt. Unsalted popcorn and pretzels. Fat-free sweets. What foods are not recommended? The items listed may not be a complete list. Talk with your dietitian about what dietary choices are best for you. Grains Baked goods made with fat, such as croissants, muffins, or some breads. Dry pasta or rice meal packs. Vegetables Creamed or fried vegetables. Vegetables in a cheese sauce. Regular canned vegetables (not low-sodium or reduced-sodium). Regular canned tomato sauce and paste (not low-sodium or reduced-sodium). Regular tomato and vegetable juice (not low-sodium or reduced-sodium). Pickles. Olives. Fruits Canned fruit in a light or heavy syrup. Fried fruit. Fruit in cream or butter sauce. Meat and other protein foods Fatty cuts of meat. Ribs. Fried meat. Bacon. Sausage. Bologna and other processed lunch meats. Salami. Fatback. Hotdogs. Bratwurst. Salted nuts and seeds. Canned beans with added salt. Canned or smoked fish. Whole eggs or egg yolks. Chicken or turkey with skin. Dairy Whole or 2% milk, cream, and half-and-half. Whole or full-fat cream cheese. Whole-fat or sweetened yogurt. Full-fat cheese. Nondairy creamers. Whipped toppings.  Processed cheese and cheese spreads. Fats and oils Butter. Stick margarine. Lard. Shortening. Ghee. Bacon fat. Tropical oils, such as coconut, palm kernel, or palm oil. Seasoning and other foods Salted popcorn and pretzels. Onion salt, garlic salt, seasoned salt, table salt, and sea salt. Worcestershire sauce. Tartar sauce. Barbecue sauce. Teriyaki sauce. Soy sauce, including reduced-sodium. Steak sauce. Canned and packaged gravies. Fish sauce. Oyster sauce. Cocktail sauce. Horseradish that you find on the shelf. Ketchup. Mustard. Meat flavorings and tenderizers. Bouillon cubes. Hot sauce and Tabasco sauce. Premade or packaged marinades. Premade or packaged taco seasonings. Relishes. Regular salad dressings. Where to find more information:  National Heart, Lung, and Blood Institute: www.nhlbi.nih.gov  American Heart Association: www.heart.org Summary  The DASH eating plan is a healthy eating plan that has been shown to reduce high blood pressure (hypertension). It may also reduce your risk for type 2 diabetes, heart disease, and stroke.  With the DASH eating plan, you should limit salt (sodium) intake to 2,300 mg a day. If you have hypertension, you may need to reduce your sodium intake to 1,500 mg a day.  When on the DASH eating plan, aim to eat more fresh fruits and vegetables, whole grains, lean proteins, low-fat dairy, and heart-healthy fats.  Work with your health care provider or diet and nutrition specialist (dietitian) to adjust your eating plan to your individual   calorie needs. This information is not intended to replace advice given to you by your health care provider. Make sure you discuss any questions you have with your health care provider. Document Released: 04/12/2011 Document Revised: 04/16/2016 Document Reviewed: 04/16/2016 Elsevier Interactive Patient Education  2018 Elsevier Inc.  

## 2018-05-21 ENCOUNTER — Encounter: Payer: Self-pay | Admitting: Gastroenterology

## 2018-05-21 ENCOUNTER — Ambulatory Visit (INDEPENDENT_AMBULATORY_CARE_PROVIDER_SITE_OTHER): Payer: Self-pay | Admitting: Gastroenterology

## 2018-05-21 ENCOUNTER — Other Ambulatory Visit: Payer: Self-pay

## 2018-05-21 VITALS — BP 137/80 | HR 60 | Ht 62.0 in | Wt 267.8 lb

## 2018-05-21 DIAGNOSIS — K7460 Unspecified cirrhosis of liver: Secondary | ICD-10-CM

## 2018-05-21 DIAGNOSIS — K7290 Hepatic failure, unspecified without coma: Secondary | ICD-10-CM

## 2018-05-21 DIAGNOSIS — K7682 Hepatic encephalopathy: Secondary | ICD-10-CM

## 2018-05-21 DIAGNOSIS — Z6841 Body Mass Index (BMI) 40.0 and over, adult: Secondary | ICD-10-CM

## 2018-05-21 MED ORDER — LACTULOSE 10 GM/15ML PO SOLN: 10.0000 g | Freq: Two times a day (BID) | ORAL | 4 refills | Status: DC | PRN

## 2018-05-21 NOTE — Progress Notes (Signed)
Wyline Mood MD, MRCP(U.K) [address]  [address]  [place], [place] [address]  Main: [phone]  Fax: [phone]   Gastroenterology Consultation  Referring Provider:     Virl Axe, MD Primary Care Physician:  Virl Axe, MD Primary Gastroenterologist:  Dr. Wyline Mood  Reason for Consultation:     Cirrhosis of liver         HPI:   Monique Edwards is a 53 y.o. y/o female referred for consultation & management  by Dr. Candelaria Stagers, Jimmie Molly, MD.   She has been referred for hepatitis C and cirrhosis of the liver. Last seen at [facility] in 2017 and was diagnosed with cirrhosis likely from HCV +/- ASH/NASH. Achieved SVR after treatment. Cured in 2015 . She was compensated in terms of her cirrhosis back in 2017. EGD in 2017 showed PHG and no varices.   Says her ankles are swollen for the past 2 weeks . Gained weight last few months. Does drink occasional alcohol , some years back used to drink a lot . She suffers from HTN, possible diabetes, obesity . Last colonoscopy in 2017 and two small adenomas excised. Does not smoke. Does complain of confusion and memory issues at times. She has a bowel movement not every day , hard when she does.   Past Medical History:  Diagnosis Date  . Fatty liver   . GERD (gastroesophageal reflux disease)   . Hypertension     Past Surgical History:  Procedure Laterality Date  . CHOLECYSTECTOMY      Prior to Admission medications   Medication Sig Start Date End Date Taking? Authorizing Provider  albuterol (PROVENTIL HFA;VENTOLIN HFA) 108 (90 Base) MCG/ACT inhaler Inhale 2 puffs into the lungs every 4 (four) hours as needed for wheezing or shortness of breath. 03/13/18   Tukov-Yual, Alroy Bailiff, NP  amLODipine (NORVASC) 10 MG tablet Take 1 tablet (10 mg total) by mouth daily. 03/13/18   Tukov-Yual, Alroy Bailiff, NP  benzonatate (TESSALON PERLES) 100 MG capsule Take 1 capsule (100 mg total) by mouth 3 (three) times daily. 03/13/18   Tukov-Yual, Alroy Bailiff, NP   ibuprofen (ADVIL,MOTRIN) 600 MG tablet Take 1 tablet (600 mg total) by mouth every 8 (eight) hours as needed. 02/27/18   Tukov-Yual, Alroy Bailiff, NP  lactulose (CHRONULAC) 10 GM/15ML solution Take 15 mLs (10 g total) by mouth 2 (two) times daily as needed for mild constipation. 02/27/18   Tukov-Yual, Alroy Bailiff, NP  omeprazole (PRILOSEC) 20 MG capsule Take 1 capsule (20 mg total) by mouth daily. 03/13/18   Tukov-Yual, Alroy Bailiff, NP  sodium chloride (OCEAN) 0.65 % SOLN nasal spray Place 2 sprays into both nostrils 4 (four) times daily for 5 days. 02/27/18 03/17/18  Tukov-Yual, Alroy Bailiff, NP  tiotropium (SPIRIVA) 18 MCG inhalation capsule Place 1 capsule (18 mcg total) into inhaler and inhale daily. Patient not taking: Reported on 03/17/2018 03/13/18   Andreas Ohm, NP    Family History  Problem Relation Age of Onset  . Hypertension Mother   . Stroke Mother   . Diabetes Father   . Arthritis Sister   . Thyroid disease Sister   . Hypertension Brother   . Hypertension Brother      Social History   Tobacco Use  . Smoking status: Former Games developer  . Smokeless tobacco: Never Used  . Tobacco comment: occasionally  Substance Use Topics  . Alcohol use: No    Comment: former occassional  . Drug use: No  Allergies as of 05/21/2018 - Review Complete 04/01/2018  Allergen Reaction Noted  . Lisinopril Cough 03/17/2018    Review of Systems:    All systems reviewed and negative except where noted in HPI.   Physical Exam:  LMP 02/28/2014 (Approximate)  Patient's last menstrual period was 02/28/2014 (approximate). Psych:  Alert and cooperative. Normal mood and affect. General:   Alert,  Well-developed, well-nourished, pleasant and cooperative in NAD Head:  Normocephalic and atraumatic. Eyes:  Sclera clear, no icterus.   Conjunctiva pink. Ears:  Normal auditory acuity. Nose:  No deformity, discharge, or lesions. Mouth:  No deformity or lesions,oropharynx pink & moist. Neck:   Supple; no masses or thyromegaly. Lungs:  Respirations even and unlabored.  Clear throughout to auscultation.   No wheezes, crackles, or rhonchi. No acute distress. Heart:  Regular rate and rhythm; no murmurs, clicks, rubs, or gallops. Abdomen:  Normal bowel sounds.  No bruits.  Soft, non-tender and non-distended without masses, hepatosplenomegaly or hernias noted.  No guarding or rebound tenderness.    Neurologic:  Alert and oriented x3;  grossly normal neurologically. Skin:  Intact without significant lesions or rashes. No jaundice. Lymph Nodes:  No significant cervical adenopathy. Psych:  Alert and cooperative. Normal mood and affect.  Imaging Studies: No results found.  Assessment and Plan:   Monique Edwards is a 53 y.o. y/o female has been referred to establish care for cirrhosis of the liver . As per old notes has been cured in 2015 . Not followed up for 2 years. She suffers likely from metabolic syndrome, obesity, also has features of minimal hepatic encephelopathy   Plan  1. EGD to screen for Varices 2. Labs to calculate MELD score 3. RUQ USG to screen for HCC 4. Check Hep A/B vaccine status and immunize as needed 5. Counseled on life style changes, low calorie diet , stop sodas, fast food, low salt diet ,  6. Commence on lactulose and titrate to two soft bowel movements per day   I have discussed alternative options, risks & benefits,  which include, but are not limited to, bleeding, infection, perforation,respiratory complication & drug reaction.  The patient agrees with this plan & written consent will be obtained.    Follow up in 6 weeks   Dr Wyline MoodKiran Daruis Swaim MD,MRCP(U.K)

## 2018-05-23 ENCOUNTER — Ambulatory Visit
Admission: RE | Admit: 2018-05-23 | Discharge: 2018-05-23 | Disposition: A | Discharge status: Home / Self Care | Payer: Self-pay | Source: Ambulatory Visit | Attending: Gastroenterology | Admitting: Gastroenterology

## 2018-05-23 DIAGNOSIS — K7460 Unspecified cirrhosis of liver: Secondary | ICD-10-CM | POA: Insufficient documentation

## 2018-05-23 LAB — COMPREHENSIVE METABOLIC PANEL
ALBUMIN: 4.4 g/dL (ref 3.5–5.5)
ALT: 31 IU/L (ref 0–32)
AST: 31 IU/L (ref 0–40)
Albumin/Globulin Ratio: 1.7 (ref 1.2–2.2)
Alkaline Phosphatase: 67 IU/L (ref 39–117)
BILIRUBIN TOTAL: 0.4 mg/dL (ref 0.0–1.2)
BUN / CREAT RATIO: 11 (ref 9–23)
BUN: 9 mg/dL (ref 6–24)
CHLORIDE: 99 mmol/L (ref 96–106)
CO2: 26 mmol/L (ref 20–29)
Calcium: 9.3 mg/dL (ref 8.7–10.2)
Creatinine, Ser: 0.8 mg/dL (ref 0.57–1.00)
GFR, EST AFRICAN AMERICAN: 97 mL/min/{1.73_m2} (ref >59)
GFR, EST NON AFRICAN AMERICAN: 84 mL/min/{1.73_m2} (ref >59)
Globulin, Total: 2.6 g/dL (ref 1.5–4.5)
Glucose: 96 mg/dL (ref 65–99)
Potassium: 4.2 mmol/L (ref 3.5–5.2)
Sodium: 139 mmol/L (ref 134–144)
TOTAL PROTEIN: 7 g/dL (ref 6.0–8.5)

## 2018-05-23 LAB — HEPATITIS B SURFACE ANTIGEN: Hepatitis B Surface Ag: NEGATIVE

## 2018-05-23 LAB — CBC WITH DIFFERENTIAL/PLATELET
BASOS ABS: 0 10*3/uL (ref 0.0–0.2)
Basos: 1 %
EOS (ABSOLUTE): 0.1 10*3/uL (ref 0.0–0.4)
Eos: 1 %
Hematocrit: 34.2 % (ref 34.0–46.6)
Hemoglobin: 11.7 g/dL (ref 11.1–15.9)
IMMATURE GRANS (ABS): 0 10*3/uL (ref 0.0–0.1)
IMMATURE GRANULOCYTES: 0 %
LYMPHS: 36 %
Lymphocytes Absolute: 1.5 10*3/uL (ref 0.7–3.1)
MCH: 28.5 pg (ref 26.6–33.0)
MCHC: 34.2 g/dL (ref 31.5–35.7)
MCV: 83 fL (ref 79–97)
MONOCYTES: 7 %
Monocytes Absolute: 0.3 10*3/uL (ref 0.1–0.9)
NEUTROS ABS: 2.2 10*3/uL (ref 1.4–7.0)
NEUTROS PCT: 55 %
PLATELETS: 79 10*3/uL — AB (ref 150–450)
RBC: 4.1 x10E6/uL (ref 3.77–5.28)
RDW: 13.4 % (ref 11.7–15.4)
WBC: 4.1 10*3/uL (ref 3.4–10.8)

## 2018-05-23 LAB — HEPATITIS B CORE ANTIBODY, TOTAL: Hep B Core Total Ab: NEGATIVE

## 2018-05-23 LAB — HCV RNA NAA QUAL RFX TO QUANT: HCV RNA NAA Qualitative: NEGATIVE

## 2018-05-23 LAB — HEPATITIS B E ANTIGEN: Hep B E Ag: NEGATIVE

## 2018-05-23 LAB — HEPATITIS B E ANTIBODY: Hep B E Ab: NEGATIVE

## 2018-05-23 LAB — PROTIME-INR
INR: 1 (ref 0.8–1.2)
PROTHROMBIN TIME: 10.6 s (ref 9.1–12.0)

## 2018-05-23 LAB — HEPATITIS A ANTIBODY, TOTAL: Hep A Total Ab: NEGATIVE

## 2018-05-23 LAB — HEPATITIS B SURFACE ANTIBODY,QUALITATIVE: Hep B Surface Ab, Qual: NONREACTIVE

## 2018-05-26 ENCOUNTER — Encounter: Payer: Self-pay | Admitting: Gastroenterology

## 2018-05-27 ENCOUNTER — Ambulatory Visit: Payer: Self-pay | Admitting: Gerontology

## 2018-05-27 ENCOUNTER — Encounter: Payer: Self-pay | Admitting: Gerontology

## 2018-05-27 ENCOUNTER — Other Ambulatory Visit: Payer: Self-pay

## 2018-05-27 VITALS — BP 136/83 | HR 68 | Wt 268.7 lb

## 2018-05-27 DIAGNOSIS — G8929 Other chronic pain: Secondary | ICD-10-CM

## 2018-05-27 DIAGNOSIS — I10 Essential (primary) hypertension: Secondary | ICD-10-CM

## 2018-05-27 DIAGNOSIS — K219 Gastro-esophageal reflux disease without esophagitis: Secondary | ICD-10-CM

## 2018-05-27 DIAGNOSIS — K760 Fatty (change of) liver, not elsewhere classified: Secondary | ICD-10-CM

## 2018-05-27 DIAGNOSIS — R0602 Shortness of breath: Secondary | ICD-10-CM

## 2018-05-27 DIAGNOSIS — M25561 Pain in right knee: Principal | ICD-10-CM

## 2018-05-27 DIAGNOSIS — R2243 Localized swelling, mass and lump, lower limb, bilateral: Secondary | ICD-10-CM

## 2018-05-27 MED ORDER — IBUPROFEN 600 MG PO TABS: 600.0000 mg | ORAL_TABLET | Freq: Three times a day (TID) | ORAL | 2 refills | Status: DC | PRN

## 2018-05-27 NOTE — Patient Instructions (Signed)
DASH Eating Plan  DASH stands for "Dietary Approaches to Stop Hypertension." The DASH eating plan is a healthy eating plan that has been shown to reduce high blood pressure (hypertension). It may also reduce your risk for type 2 diabetes, heart disease, and stroke. The DASH eating plan may also help with weight loss.  What are tips for following this plan?    General guidelines   Avoid eating more than 2,300 mg (milligrams) of salt (sodium) a day. If you have hypertension, you may need to reduce your sodium intake to 1,500 mg a day.   Limit alcohol intake to no more than 1 drink a day for nonpregnant women and 2 drinks a day for men. One drink equals 12 oz of beer, 5 oz of wine, or 1 oz of hard liquor.   Work with your health care provider to maintain a healthy body weight or to lose weight. Ask what an ideal weight is for you.   Get at least 30 minutes of exercise that causes your heart to beat faster (aerobic exercise) most days of the week. Activities may include walking, swimming, or biking.   Work with your health care provider or diet and nutrition specialist (dietitian) to adjust your eating plan to your individual calorie needs.  Reading food labels     Check food labels for the amount of sodium per serving. Choose foods with less than 5 percent of the Daily Value of sodium. Generally, foods with less than 300 mg of sodium per serving fit into this eating plan.   To find whole grains, look for the word "whole" as the first word in the ingredient list.  Shopping   Buy products labeled as "low-sodium" or "no salt added."   Buy fresh foods. Avoid canned foods and premade or frozen meals.  Cooking   Avoid adding salt when cooking. Use salt-free seasonings or herbs instead of table salt or sea salt. Check with your health care provider or pharmacist before using salt substitutes.   Do not fry foods. Cook foods using healthy methods such as baking, boiling, grilling, and broiling instead.   Cook with  heart-healthy oils, such as olive, canola, soybean, or sunflower oil.  Meal planning   Eat a balanced diet that includes:  ? 5 or more servings of fruits and vegetables each day. At each meal, try to fill half of your plate with fruits and vegetables.  ? Up to 6-8 servings of whole grains each day.  ? Less than 6 oz of lean meat, poultry, or fish each day. A 3-oz serving of meat is about the same size as a deck of cards. One egg equals 1 oz.  ? 2 servings of low-fat dairy each day.  ? A serving of nuts, seeds, or beans 5 times each week.  ? Heart-healthy fats. Healthy fats called Omega-3 fatty acids are found in foods such as flaxseeds and coldwater fish, like sardines, salmon, and mackerel.   Limit how much you eat of the following:  ? Canned or prepackaged foods.  ? Food that is high in trans fat, such as fried foods.  ? Food that is high in saturated fat, such as fatty meat.  ? Sweets, desserts, sugary drinks, and other foods with added sugar.  ? Full-fat dairy products.   Do not salt foods before eating.   Try to eat at least 2 vegetarian meals each week.   Eat more home-cooked food and less restaurant, buffet, and fast food.     When eating at a restaurant, ask that your food be prepared with less salt or no salt, if possible.  What foods are recommended?  The items listed may not be a complete list. Talk with your dietitian about what dietary choices are best for you.  Grains  Whole-grain or whole-wheat bread. Whole-grain or whole-wheat pasta. Brown rice. Oatmeal. Quinoa. Bulgur. Whole-grain and low-sodium cereals. Pita bread. Low-fat, low-sodium crackers. Whole-wheat flour tortillas.  Vegetables  Fresh or frozen vegetables (raw, steamed, roasted, or grilled). Low-sodium or reduced-sodium tomato and vegetable juice. Low-sodium or reduced-sodium tomato sauce and tomato paste. Low-sodium or reduced-sodium canned vegetables.  Fruits  All fresh, dried, or frozen fruit. Canned fruit in natural juice (without  added sugar).  Meat and other protein foods  Skinless chicken or turkey. Ground chicken or turkey. Pork with fat trimmed off. Fish and seafood. Egg whites. Dried beans, peas, or lentils. Unsalted nuts, nut butters, and seeds. Unsalted canned beans. Lean cuts of beef with fat trimmed off. Low-sodium, lean deli meat.  Dairy  Low-fat (1%) or fat-free (skim) milk. Fat-free, low-fat, or reduced-fat cheeses. Nonfat, low-sodium ricotta or cottage cheese. Low-fat or nonfat yogurt. Low-fat, low-sodium cheese.  Fats and oils  Soft margarine without trans fats. Vegetable oil. Low-fat, reduced-fat, or light mayonnaise and salad dressings (reduced-sodium). Canola, safflower, olive, soybean, and sunflower oils. Avocado.  Seasoning and other foods  Herbs. Spices. Seasoning mixes without salt. Unsalted popcorn and pretzels. Fat-free sweets.  What foods are not recommended?  The items listed may not be a complete list. Talk with your dietitian about what dietary choices are best for you.  Grains  Baked goods made with fat, such as croissants, muffins, or some breads. Dry pasta or rice meal packs.  Vegetables  Creamed or fried vegetables. Vegetables in a cheese sauce. Regular canned vegetables (not low-sodium or reduced-sodium). Regular canned tomato sauce and paste (not low-sodium or reduced-sodium). Regular tomato and vegetable juice (not low-sodium or reduced-sodium). Pickles. Olives.  Fruits  Canned fruit in a light or heavy syrup. Fried fruit. Fruit in cream or butter sauce.  Meat and other protein foods  Fatty cuts of meat. Ribs. Fried meat. Bacon. Sausage. Bologna and other processed lunch meats. Salami. Fatback. Hotdogs. Bratwurst. Salted nuts and seeds. Canned beans with added salt. Canned or smoked fish. Whole eggs or egg yolks. Chicken or turkey with skin.  Dairy  Whole or 2% milk, cream, and half-and-half. Whole or full-fat cream cheese. Whole-fat or sweetened yogurt. Full-fat cheese. Nondairy creamers. Whipped toppings.  Processed cheese and cheese spreads.  Fats and oils  Butter. Stick margarine. Lard. Shortening. Ghee. Bacon fat. Tropical oils, such as coconut, palm kernel, or palm oil.  Seasoning and other foods  Salted popcorn and pretzels. Onion salt, garlic salt, seasoned salt, table salt, and sea salt. Worcestershire sauce. Tartar sauce. Barbecue sauce. Teriyaki sauce. Soy sauce, including reduced-sodium. Steak sauce. Canned and packaged gravies. Fish sauce. Oyster sauce. Cocktail sauce. Horseradish that you find on the shelf. Ketchup. Mustard. Meat flavorings and tenderizers. Bouillon cubes. Hot sauce and Tabasco sauce. Premade or packaged marinades. Premade or packaged taco seasonings. Relishes. Regular salad dressings.  Where to find more information:   National Heart, Lung, and Blood Institute: www.nhlbi.nih.gov   American Heart Association: www.heart.org  Summary   The DASH eating plan is a healthy eating plan that has been shown to reduce high blood pressure (hypertension). It may also reduce your risk for type 2 diabetes, heart disease, and stroke.   With the   DASH eating plan, you should limit salt (sodium) intake to 2,300 mg a day. If you have hypertension, you may need to reduce your sodium intake to 1,500 mg a day.   When on the DASH eating plan, aim to eat more fresh fruits and vegetables, whole grains, lean proteins, low-fat dairy, and heart-healthy fats.   Work with your health care provider or diet and nutrition specialist (dietitian) to adjust your eating plan to your individual calorie needs.  This information is not intended to replace advice given to you by your health care provider. Make sure you discuss any questions you have with your health care provider.  Document Released: 04/12/2011 Document Revised: 04/16/2016 Document Reviewed: 04/16/2016  Elsevier Interactive Patient Education  2019 [employer]vier Inc.

## 2018-05-27 NOTE — Progress Notes (Signed)
Patient: Monique Edwards Female    DOB: 05-07-1966   53 y.o.   MRN: [id number] Visit Date: 05/27/2018  Today's Provider: Rolm Gala, NP   Chief Complaint  Patient presents with  . Follow-up    ankles swelling,hands are numb, SOB   Subjective:    HPI  Ms. Monique Edwards 54 y/o female presents for follow up on Gerd, HTN, Fatty Liver,. Currently she stated that her ankles swells everyday and has being going on for 1 month. She reports elevating her legs and the swelling resolves in the morning. She was seen at the [facility] GI with Dr Sharlet Salina for evaluation of hepatitis C and cirrhosis of liver and will follow up in 6 weeks . Hepatitis panel done 1/15/ 20 were negative, and  Platelet was 79, she denies rectal bleed, blood in stool and bruises.RUQ [place] was done on 05/23/2018 and impression was heterogenous echo texture throughout the liver, no focal hepatic abnormality and no acute findings. and EGD scheduled for 06/06/18.  She reports having soft bowel movement regularly and takes 47ml of Lactulose once daily instead of bid and  she reports that she still forget things but has some improvement in her memory since starting lactulose.  She denies Headache, chest pain, palpitation , fever, chills. She reports having intermittent shortness of breath and uses Proventil as needed.    Allergies  Allergen Reactions  . Lisinopril Cough   Previous Medications   ALBUTEROL (PROVENTIL HFA;VENTOLIN HFA) 108 (90 BASE) MCG/ACT INHALER    Inhale 2 puffs into the lungs every 4 (four) hours as needed for wheezing or shortness of breath.   AMLODIPINE (NORVASC) 10 MG TABLET    Take 1 tablet (10 mg total) by mouth daily.   BENZONATATE (TESSALON PERLES) 100 MG CAPSULE    Take 1 capsule (100 mg total) by mouth 3 (three) times daily.   IBUPROFEN (ADVIL,MOTRIN) 600 MG TABLET    Take 1 tablet (600 mg total) by mouth every 8 (eight) hours as needed.   LACTULOSE (CHRONULAC) 10 GM/15ML SOLUTION    Take 15 mLs (10 g total) by  mouth 2 (two) times daily as needed for mild constipation.   OMEPRAZOLE (PRILOSEC) 20 MG CAPSULE    Take 1 capsule (20 mg total) by mouth daily.   SODIUM CHLORIDE (OCEAN) 0.65 % SOLN NASAL SPRAY    Place 2 sprays into both nostrils 4 (four) times daily for 5 days.   TIOTROPIUM (SPIRIVA) 18 MCG INHALATION CAPSULE    Place 1 capsule (18 mcg total) into inhaler and inhale daily.   UMECLIDINIUM BROMIDE (INCRUSE ELLIPTA) 62.5 MCG/INH AEPB    Inhale 1 puff into the lungs daily.    Review of Systems  Constitutional: Negative.   HENT: Negative.   Eyes: Negative.   Respiratory: Positive for shortness of breath (with exertion).   Cardiovascular: Positive for leg swelling (ankle swelling).  Gastrointestinal: Negative.   Endocrine: Negative.   Genitourinary: Negative.   Musculoskeletal: Positive for arthralgias (2/10 chronic knee pain).  Skin: Negative.   Neurological: Negative.   Psychiatric/Behavioral: Negative.     Social History   Tobacco Use  . Smoking status: Former Games developer  . Smokeless tobacco: Never Used  . Tobacco comment: occasionally  Substance Use Topics  . Alcohol use: No    Comment: former occassional   Objective:   BP 136/83 (BP Location: Left Arm, Patient Position: Sitting)   Pulse 68   Wt 268 lb 11.2 oz (121.9 kg)   LMP 02/28/2014 (Approximate)  SpO2 97%   BMI 49.15 kg/m   Physical Exam Constitutional:      Appearance: Normal appearance.  HENT:     Head: Normocephalic and atraumatic.     Mouth/Throat:     Mouth: Mucous membranes are moist.  Eyes:     Extraocular Movements: Extraocular movements intact.     Pupils: Pupils are equal, round, and reactive to light.  Neck:     Musculoskeletal: Normal range of motion.  Cardiovascular:     Rate and Rhythm: Normal rate and regular rhythm.     Pulses: Normal pulses.     Heart sounds: Normal heart sounds.  Pulmonary:     Effort: Pulmonary effort is normal.     Breath sounds: Normal breath sounds.  Abdominal:      General: Bowel sounds are normal. Distention: central obesity.  Musculoskeletal:     Right lower leg: Right lower leg edema: trace edema.     Left lower leg: Left lower leg edema: trace edema.  Skin:    General: Skin is warm and dry.  Neurological:     General: No focal deficit present.     Mental Status: She is alert and oriented to person, place, and time.  Psychiatric:        Mood and Affect: Mood normal.        Behavior: Behavior normal.        Thought Content: Thought content normal.        Judgment: Judgment normal.         Assessment & Plan:         1. Fatty liver - She will follow up with Dr Sharlet Salina in 6 weeks - EGD ON 06/06/2018  2. Essential hypertension - Well controlled blood pressure was 136/83 and goal < 140/90. Will discontinue Amlodipine if lower extremity swelling continues in 2 weeks.  - She was advised to continue on low salt diet, lose weight and elevate legs while sitting down. -   3. GERD without esophagitis - She will continue on 20 mg Omeprazole, follow up with Dr. Sharlet Salina and EGD 06/06/2018  4. Morbid obesity (HCC) - She was strongly encouraged to lose weight  5. Localized swelling of both lower legs - She was advised to elevate legs while sitting down  6. Shortness of breath - She was advised to use Albuterol as needed, notify provider for worsening symptoms, advised to lose weight.  7. Chronic pain of right knee - She will continue taking Ibuprofen, monitor for rectal bleed or blood in stool. She was advised to lose weight. - ibuprofen (ADVIL,MOTRIN) 600 MG tablet; Take 1 tablet (600 mg total) by mouth every 8 (eight) hours as needed.  Dispense: 30 tablet; Refill: 2 - Follow up in 2 weeks to evaluate bilateral lower leg edema and will recheck platelet.   Rolm Gala, NP   [facility]

## 2018-06-05 ENCOUNTER — Encounter: Payer: Self-pay | Admitting: Anesthesiology

## 2018-06-05 ENCOUNTER — Telehealth: Payer: Self-pay

## 2018-06-05 NOTE — Telephone Encounter (Signed)
-----   Message from Wyline Mood, MD sent at 06/02/2018 10:21 AM EST -----  Denton Meek inform labs look good. No hepatitis C. Needs Hep A/B vaccine   C/c Virl Axe, MD

## 2018-06-05 NOTE — Telephone Encounter (Signed)
Spoke with pt and informed her of lab results and Dr. Johnney Killian suggestion for pt to receive Hep A/B vaccine. Pt plans to receive the first dose of Hep A/B vaccine at our clinic on 06-06-18.

## 2018-06-06 ENCOUNTER — Ambulatory Visit: Payer: Self-pay | Admitting: Anesthesiology

## 2018-06-06 ENCOUNTER — Encounter: Payer: Self-pay | Admitting: *Deleted

## 2018-06-06 ENCOUNTER — Encounter
Admission: RE | Disposition: A | Discharge status: Home / Self Care | Payer: Self-pay | Source: Home / Self Care | Attending: Gastroenterology

## 2018-06-06 ENCOUNTER — Ambulatory Visit
Admission: RE | Admit: 2018-06-06 | Discharge: 2018-06-06 | Disposition: A | Discharge status: Home / Self Care | Payer: Self-pay | Attending: Gastroenterology | Admitting: Gastroenterology

## 2018-06-06 ENCOUNTER — Ambulatory Visit: Payer: Self-pay

## 2018-06-06 DIAGNOSIS — K7290 Hepatic failure, unspecified without coma: Secondary | ICD-10-CM | POA: Insufficient documentation

## 2018-06-06 DIAGNOSIS — K219 Gastro-esophageal reflux disease without esophagitis: Secondary | ICD-10-CM | POA: Insufficient documentation

## 2018-06-06 DIAGNOSIS — K7460 Unspecified cirrhosis of liver: Secondary | ICD-10-CM | POA: Insufficient documentation

## 2018-06-06 DIAGNOSIS — F17210 Nicotine dependence, cigarettes, uncomplicated: Secondary | ICD-10-CM | POA: Insufficient documentation

## 2018-06-06 DIAGNOSIS — I10 Essential (primary) hypertension: Secondary | ICD-10-CM | POA: Insufficient documentation

## 2018-06-06 DIAGNOSIS — J449 Chronic obstructive pulmonary disease, unspecified: Secondary | ICD-10-CM | POA: Insufficient documentation

## 2018-06-06 DIAGNOSIS — Z6841 Body Mass Index (BMI) 40.0 and over, adult: Secondary | ICD-10-CM | POA: Insufficient documentation

## 2018-06-06 DIAGNOSIS — K7682 Hepatic encephalopathy: Secondary | ICD-10-CM

## 2018-06-06 DIAGNOSIS — Z79899 Other long term (current) drug therapy: Secondary | ICD-10-CM | POA: Insufficient documentation

## 2018-06-06 HISTORY — DX: Chronic obstructive pulmonary disease, unspecified: J44.9

## 2018-06-06 HISTORY — PX: ESOPHAGOGASTRODUODENOSCOPY (EGD) WITH PROPOFOL: SHX5813

## 2018-06-06 SURGERY — ESOPHAGOGASTRODUODENOSCOPY (EGD) WITH PROPOFOL
Anesthesia: General

## 2018-06-06 MED ORDER — PROPOFOL 500 MG/50ML IV EMUL
INTRAVENOUS | Status: DC | PRN
  Administered 2018-06-06: 150 ug/kg/min via INTRAVENOUS

## 2018-06-06 MED ORDER — LIDOCAINE HCL (PF) 2 % IJ SOLN
INTRAMUSCULAR | Status: AC
  Filled 2018-06-06: qty 10

## 2018-06-06 MED ORDER — PROPOFOL 10 MG/ML IV BOLUS
INTRAVENOUS | Status: AC
  Filled 2018-06-06: qty 40

## 2018-06-06 MED ORDER — SODIUM CHLORIDE 0.9 % IV SOLN
INTRAVENOUS | Status: DC
  Administered 2018-06-06: 08:00:00 via INTRAVENOUS
  Administered 2018-06-06: 1000 mL via INTRAVENOUS

## 2018-06-06 MED ORDER — LIDOCAINE HCL (CARDIAC) PF 100 MG/5ML IV SOSY
PREFILLED_SYRINGE | INTRAVENOUS | Status: DC | PRN
  Administered 2018-06-06: 50 mg via INTRAVENOUS

## 2018-06-06 MED ORDER — PROPOFOL 10 MG/ML IV BOLUS
INTRAVENOUS | Status: DC | PRN
  Administered 2018-06-06: 50 mg via INTRAVENOUS

## 2018-06-06 MED ORDER — PHENYLEPHRINE HCL 10 MG/ML IJ SOLN
INTRAMUSCULAR | Status: AC
  Filled 2018-06-06: qty 1

## 2018-06-06 NOTE — Anesthesia Preprocedure Evaluation (Addendum)
Anesthesia Evaluation  Patient identified by MRN, date of birth, ID band Patient awake    Reviewed: Allergy & Precautions, NPO status , Patient's Chart, lab work & pertinent test results, reviewed documented beta blocker date and time   Airway Mallampati: III  TM Distance: >3 FB     Dental  (+) Chipped   Pulmonary COPD, Current Smoker,           Cardiovascular hypertension, Pt. on medications      Neuro/Psych    GI/Hepatic GERD  ,  Endo/Other  Morbid obesity  Renal/GU      Musculoskeletal   Abdominal   Peds  Hematology   Anesthesia Other Findings Smokes. EKG ok. Platelets 79000. Hb 11.7.   Reproductive/Obstetrics                            Anesthesia Physical Anesthesia Plan  ASA: III  Anesthesia Plan: General   Post-op Pain Management:    Induction: Intravenous  PONV Risk Score and Plan:   Airway Management Planned:   Additional Equipment:   Intra-op Plan:   Post-operative Plan:   Informed Consent: I have reviewed the patients History and Physical, chart, labs and discussed the procedure including the risks, benefits and alternatives for the proposed anesthesia with the patient or authorized representative who has indicated his/her understanding and acceptance.       Plan Discussed with: CRNA  Anesthesia Plan Comments:         Anesthesia Quick Evaluation

## 2018-06-06 NOTE — Op Note (Signed)
[facility] Patient Name: Monique Edwards Procedure Date: 06/06/2018 8:08 AM MRN: [id number] Account #: [id number] Date of Birth: 1965/11/27 Admit Type: Outpatient Age: 53 Room: [facility] ENDO ROOM 4 Gender: Female Note Status: Finalized Procedure:            Upper GI endoscopy Indications:          Cirrhosis rule out esophageal varices Providers:            Wyline Mood MD, MD Referring MD:         Gracelyn Nurse, MD (Referring MD) Medicines:            Monitored Anesthesia Care Complications:        No immediate complications. Procedure:            Pre-Anesthesia Assessment:                       - Prior to the procedure, a History and Physical was                        performed, and patient medications, allergies and                        sensitivities were reviewed. The patient's tolerance of                        previous anesthesia was reviewed.                       - The risks and benefits of the procedure and the                        sedation options and risks were discussed with the                        patient. All questions were answered and informed                        consent was obtained.                       - ASA Grade Assessment: III - A patient with severe                        systemic disease.                       After obtaining informed consent, the endoscope was                        passed under direct vision. Throughout the procedure,                        the patient's blood pressure, pulse, and oxygen                        saturations were monitored continuously. The Endoscope                        was introduced through the mouth, and advanced to the  third part of duodenum. The upper GI endoscopy was                        accomplished with ease. The patient tolerated the                        procedure well. Findings:      The examined duodenum was normal.      The stomach was  normal.      The exam was otherwise without abnormality.      The cardia and gastric fundus were normal on retroflexion.      The esophagus was normal. Impression:           - Normal examined duodenum.                       - Normal stomach.                       - The examination was otherwise normal.                       - Normal esophagus.                       - No specimens collected. Recommendation:       - Discharge patient to home (with escort).                       - Resume previous diet.                       - Continue present medications.                       - Repeat upper endoscopy in 3 years for surveillance.                       - Return to GI office as previously scheduled. Procedure Code(s):    --- Professional ---                       [phone], Esophagogastroduodenoscopy, flexible, transoral;                        diagnostic, including collection of specimen(s) by                        brushing or washing, when performed (separate procedure) Diagnosis Code(s):    --- Professional ---                       K74.60, Unspecified cirrhosis of liver CPT copyright 2018 American Medical Association. All rights reserved. The codes documented in this report are preliminary and upon coder review may  be revised to meet current compliance requirements. Wyline Mood, MD Wyline Mood MD, MD 06/06/2018 8:21:26 AM This report has been signed electronically. Number of Addenda: 0 Note Initiated On: 06/06/2018 8:08 AM      [facility]

## 2018-06-06 NOTE — Anesthesia Postprocedure Evaluation (Signed)
Anesthesia Post Note  Patient: Monique Edwards  Procedure(s) Performed: ESOPHAGOGASTRODUODENOSCOPY (EGD) WITH PROPOFOL (N/A )  Patient location during evaluation: Endoscopy Anesthesia Type: General Level of consciousness: awake and alert Pain management: pain level controlled Vital Signs Assessment: post-procedure vital signs reviewed and stable Respiratory status: spontaneous breathing, nonlabored ventilation, respiratory function stable and patient connected to nasal cannula oxygen Cardiovascular status: blood pressure returned to baseline and stable Postop Assessment: no apparent nausea or vomiting Anesthetic complications: no     Last Vitals:  Vitals:   06/06/18 0836 06/06/18 0846  BP: 116/72 132/82  Pulse: 66 (!) 57  Resp: 14 18  Temp:    SpO2: 97% 98%    Last Pain:  Vitals:   06/06/18 0846  TempSrc:   PainSc: 0-No pain                 Ellis Koffler S

## 2018-06-06 NOTE — H&P (Signed)
Wyline Mood, MD [address], [place], [place], [address] [address], [place], [place], [address] Phone: [phone]  Fax: [phone]  Primary Care Physician:  Gracelyn Nurse, MD   Pre-Procedure History & Physical: HPI:  Monique Edwards is a 53 y.o. female is here for an endoscopy    Past Medical History:  Diagnosis Date  . COPD (chronic obstructive pulmonary disease) (HCC)   . Fatty liver   . GERD (gastroesophageal reflux disease)   . Hypertension     Past Surgical History:  Procedure Laterality Date  . CHOLECYSTECTOMY      Prior to Admission medications   Medication Sig Start Date End Date Taking? Authorizing Provider  amLODipine (NORVASC) 10 MG tablet Take 1 tablet (10 mg total) by mouth daily. 03/13/18  Yes Tukov-Yual, Alroy Bailiff, NP  lactulose (CHRONULAC) 10 GM/15ML solution Take 15 mLs (10 g total) by mouth 2 (two) times daily as needed for mild constipation. 05/21/18  Yes Wyline Mood, MD  omeprazole (PRILOSEC) 20 MG capsule Take 1 capsule (20 mg total) by mouth daily. 03/13/18  Yes Tukov-Yual, Alroy Bailiff, NP  tiotropium (SPIRIVA) 18 MCG inhalation capsule Place 1 capsule (18 mcg total) into inhaler and inhale daily. 03/13/18  Yes Tukov-Yual, Alroy Bailiff, NP  albuterol (PROVENTIL HFA;VENTOLIN HFA) 108 (90 Base) MCG/ACT inhaler Inhale 2 puffs into the lungs every 4 (four) hours as needed for wheezing or shortness of breath. 03/13/18   Tukov-Yual, Alroy Bailiff, NP  benzonatate (TESSALON PERLES) 100 MG capsule Take 1 capsule (100 mg total) by mouth 3 (three) times daily. Patient not taking: Reported on 05/21/2018 03/13/18   Andreas Ohm, NP  ibuprofen (ADVIL,MOTRIN) 600 MG tablet Take 1 tablet (600 mg total) by mouth every 8 (eight) hours as needed. 05/27/18   Iloabachie, Chioma E, NP  sodium chloride (OCEAN) 0.65 % SOLN nasal spray Place 2 sprays into both nostrils 4 (four) times daily for 5 days. 02/27/18 03/17/18  Tukov-Yual, Alroy Bailiff, NP   umeclidinium bromide (INCRUSE ELLIPTA) 62.5 MCG/INH AEPB Inhale 1 puff into the lungs daily.    [provider]    Allergies as of 05/21/2018 - Review Complete 05/21/2018  Allergen Reaction Noted  . Lisinopril Cough 03/17/2018    Family History  Problem Relation Age of Onset  . Hypertension Mother   . Stroke Mother   . Diabetes Father   . Arthritis Sister   . Thyroid disease Sister   . Hypertension Brother   . Hypertension Brother     Social History   Socioeconomic History  . Marital status: Single    Spouse name: Not on file  . Number of children: Not on file  . Years of education: Not on file  . Highest education level: Not on file  Occupational History  . Not on file  Social Needs  . Financial resource strain: Not on file  . Food insecurity:    Worry: Not on file    Inability: Not on file  . Transportation needs:    Medical: Not on file    Non-medical: Not on file  Tobacco Use  . Smoking status: Current Some Day Smoker    Types: Cigarettes  . Smokeless tobacco: Never Used  . Tobacco comment: occasionally  Substance and Sexual Activity  . Alcohol use: No    Comment: former occassional  . Drug use: No  . Sexual activity: Not on file  Lifestyle  . Physical activity:    Days  per week: Not on file    Minutes per session: Not on file  . Stress: Not on file  Relationships  . Social connections:    Talks on phone: Not on file    Gets together: Not on file    Attends religious service: Not on file    Active member of club or organization: Not on file    Attends meetings of clubs or organizations: Not on file    Relationship status: Not on file  . Intimate partner violence:    Fear of current or ex partner: Not on file    Emotionally abused: Not on file    Physically abused: Not on file    Forced sexual activity: Not on file  Other Topics Concern  . Not on file  Social History Narrative  . Not on file    Review of Systems: See HPI, otherwise  negative ROS  Physical Exam: BP 136/74   Pulse 70   Temp (!) 97.2 F (36.2 C) (Tympanic)   Resp 20   Ht 5\' 2"  (1.575 m)   Wt 119.7 kg   LMP 02/28/2014 (Approximate)   SpO2 98%   BMI 48.29 kg/m  General:   Alert,  pleasant and cooperative in NAD Head:  Normocephalic and atraumatic. Neck:  Supple; no masses or thyromegaly. Lungs:  Clear throughout to auscultation, normal respiratory effort.    Heart:  +S1, +S2, Regular rate and rhythm, No edema. Abdomen:  Soft, nontender and nondistended. Normal bowel sounds, without guarding, and without rebound.   Neurologic:  Alert and  oriented x4;  grossly normal neurologically.  Impression/Plan: Monique Edwards is here for an endoscopy  to be performed for  evaluation of esophageal varices    Risks, benefits, limitations, and alternatives regarding endoscopy have been reviewed with the patient.  Questions have been answered.  All parties agreeable.   Wyline Mood, MD  06/06/2018, 8:05 AM

## 2018-06-06 NOTE — Anesthesia Post-op Follow-up Note (Signed)
Anesthesia QCDR form completed.        

## 2018-06-06 NOTE — Transfer of Care (Signed)
Immediate Anesthesia Transfer of Care Note  Patient: Monique Edwards  Procedure(s) Performed: ESOPHAGOGASTRODUODENOSCOPY (EGD) WITH PROPOFOL (N/A )  Patient Location: Endoscopy Unit  Anesthesia Type:General  Level of Consciousness: awake, alert  and oriented  Airway & Oxygen Therapy: Patient Spontanous Breathing and Patient connected to nasal cannula oxygen  Post-op Assessment: Report given to RN and Post -op Vital signs reviewed and stable  Post vital signs: Reviewed and stable  Last Vitals:  Vitals Value Taken Time  BP    Temp    Pulse    Resp    SpO2      Last Pain:  Vitals:   06/06/18 0746  TempSrc: Tympanic  PainSc: 3       Patients Stated Pain Goal: 0 (06/06/18 0746)  Complications: No apparent anesthesia complications

## 2018-06-09 ENCOUNTER — Encounter: Payer: Self-pay | Admitting: Gastroenterology

## 2018-06-09 ENCOUNTER — Other Ambulatory Visit: Payer: Self-pay | Admitting: Internal Medicine

## 2018-06-10 ENCOUNTER — Ambulatory Visit: Payer: Self-pay | Admitting: Gerontology

## 2018-06-10 ENCOUNTER — Other Ambulatory Visit: Payer: Self-pay

## 2018-06-10 ENCOUNTER — Encounter: Payer: Self-pay | Admitting: Gerontology

## 2018-06-10 DIAGNOSIS — R200 Anesthesia of skin: Secondary | ICD-10-CM

## 2018-06-10 DIAGNOSIS — R600 Localized edema: Secondary | ICD-10-CM

## 2018-06-10 DIAGNOSIS — R609 Edema, unspecified: Secondary | ICD-10-CM

## 2018-06-10 DIAGNOSIS — Z0000 Encounter for general adult medical examination without abnormal findings: Secondary | ICD-10-CM

## 2018-06-10 DIAGNOSIS — I10 Essential (primary) hypertension: Secondary | ICD-10-CM

## 2018-06-10 MED ORDER — LOSARTAN POTASSIUM 50 MG PO TABS: 25.0000 mg | ORAL_TABLET | Freq: Every day | ORAL | 0 refills | Status: DC

## 2018-06-10 NOTE — Patient Instructions (Signed)
- Stop taking Amlodipine     DASH Eating Plan DASH stands for "Dietary Approaches to Stop Hypertension." The DASH eating plan is a healthy eating plan that has been shown to reduce high blood pressure (hypertension). It may also reduce your risk for type 2 diabetes, heart disease, and stroke. The DASH eating plan may also help with weight loss. What are tips for following this plan?  General guidelines  Avoid eating more than 2,300 mg (milligrams) of salt (sodium) a day. If you have hypertension, you may need to reduce your sodium intake to 1,500 mg a day.  Limit alcohol intake to no more than 1 drink a day for nonpregnant women and 2 drinks a day for men. One drink equals 12 oz of beer, 5 oz of wine, or 1 oz of hard liquor.  Work with your health care provider to maintain a healthy body weight or to lose weight. Ask what an ideal weight is for you.  Get at least 30 minutes of exercise that causes your heart to beat faster (aerobic exercise) most days of the week. Activities may include walking, swimming, or biking.  Work with your health care provider or diet and nutrition specialist (dietitian) to adjust your eating plan to your individual calorie needs. Reading food labels   Check food labels for the amount of sodium per serving. Choose foods with less than 5 percent of the Daily Value of sodium. Generally, foods with less than 300 mg of sodium per serving fit into this eating plan.  To find whole grains, look for the word "whole" as the first word in the ingredient list. Shopping  Buy products labeled as "low-sodium" or "no salt added."  Buy fresh foods. Avoid canned foods and premade or frozen meals. Cooking  Avoid adding salt when cooking. Use salt-free seasonings or herbs instead of table salt or sea salt. Check with your health care provider or pharmacist before using salt substitutes.  Do not fry foods. Cook foods using healthy methods such as baking, boiling, grilling,  and broiling instead.  Cook with heart-healthy oils, such as olive, canola, soybean, or sunflower oil. Meal planning  Eat a balanced diet that includes: ? 5 or more servings of fruits and vegetables each day. At each meal, try to fill half of your plate with fruits and vegetables. ? Up to 6-8 servings of whole grains each day. ? Less than 6 oz of lean meat, poultry, or fish each day. A 3-oz serving of meat is about the same size as a deck of cards. One egg equals 1 oz. ? 2 servings of low-fat dairy each day. ? A serving of nuts, seeds, or beans 5 times each week. ? Heart-healthy fats. Healthy fats called Omega-3 fatty acids are found in foods such as flaxseeds and coldwater fish, like sardines, salmon, and mackerel.  Limit how much you eat of the following: ? Canned or prepackaged foods. ? Food that is high in trans fat, such as fried foods. ? Food that is high in saturated fat, such as fatty meat. ? Sweets, desserts, sugary drinks, and other foods with added sugar. ? Full-fat dairy products.  Do not salt foods before eating.  Try to eat at least 2 vegetarian meals each week.  Eat more home-cooked food and less restaurant, buffet, and fast food.  When eating at a restaurant, ask that your food be prepared with less salt or no salt, if possible. What foods are recommended? The items listed may not be  a complete list. Talk with your dietitian about what dietary choices are best for you. Grains Whole-grain or whole-wheat bread. Whole-grain or whole-wheat pasta. Brown rice. [place]. Bulgur. Whole-grain and low-sodium cereals. Pita bread. Low-fat, low-sodium crackers. Whole-wheat flour tortillas. Vegetables Fresh or frozen vegetables (raw, steamed, roasted, or grilled). Low-sodium or reduced-sodium tomato and vegetable juice. Low-sodium or reduced-sodium tomato sauce and tomato paste. Low-sodium or reduced-sodium canned vegetables. Fruits All fresh, dried, or frozen fruit. Canned  fruit in natural juice (without added sugar). Meat and other protein foods Skinless chicken or [place]. Ground chicken or [place]. Pork with fat trimmed off. Fish and seafood. Egg whites. Dried beans, peas, or lentils. Unsalted nuts, nut butters, and seeds. Unsalted canned beans. Lean cuts of beef with fat trimmed off. Low-sodium, lean deli meat. Dairy Low-fat (1%) or fat-free (skim) milk. Fat-free, low-fat, or reduced-fat cheeses. Nonfat, low-sodium ricotta or cottage cheese. Low-fat or nonfat yogurt. Low-fat, low-sodium cheese. Fats and oils Soft margarine without trans fats. Vegetable oil. Low-fat, reduced-fat, or light mayonnaise and salad dressings (reduced-sodium). Canola, safflower, olive, soybean, and sunflower oils. Avocado. Seasoning and other foods Herbs. Spices. Seasoning mixes without salt. Unsalted popcorn and pretzels. Fat-free sweets. What foods are not recommended? The items listed may not be a complete list. Talk with your dietitian about what dietary choices are best for you. Grains Baked goods made with fat, such as croissants, muffins, or some breads. Dry pasta or rice meal packs. Vegetables Creamed or fried vegetables. Vegetables in a cheese sauce. Regular canned vegetables (not low-sodium or reduced-sodium). Regular canned tomato sauce and paste (not low-sodium or reduced-sodium). Regular tomato and vegetable juice (not low-sodium or reduced-sodium). Angie Fava. Olives. Fruits Canned fruit in a light or heavy syrup. Fried fruit. Fruit in cream or butter sauce. Meat and other protein foods Fatty cuts of meat. Ribs. Fried meat. Berniece Salines. Sausage. Bologna and other processed lunch meats. Salami. Fatback. Hotdogs. Bratwurst. Salted nuts and seeds. Canned beans with added salt. Canned or smoked fish. Whole eggs or egg yolks. Chicken or [place] with skin. Dairy Whole or 2% milk, cream, and half-and-half. Whole or full-fat cream cheese. Whole-fat or sweetened yogurt. Full-fat cheese.  Nondairy creamers. Whipped toppings. Processed cheese and cheese spreads. Fats and oils Butter. Stick margarine. Lard. Shortening. Ghee. Bacon fat. Tropical oils, such as coconut, palm kernel, or palm oil. Seasoning and other foods Salted popcorn and pretzels. Onion salt, garlic salt, seasoned salt, table salt, and sea salt. Worcestershire sauce. Tartar sauce. Barbecue sauce. Teriyaki sauce. Soy sauce, including reduced-sodium. Steak sauce. Canned and packaged gravies. Fish sauce. Oyster sauce. Cocktail sauce. Horseradish that you find on the shelf. Ketchup. Mustard. Meat flavorings and tenderizers. Bouillon cubes. Hot sauce and Tabasco sauce. Premade or packaged marinades. Premade or packaged taco seasonings. Relishes. Regular salad dressings. Where to find more information:  National Heart, Lung, and [place]: https://wilson-eaton.com/  American Heart Association: www.heart.org Summary  The DASH eating plan is a healthy eating plan that has been shown to reduce high blood pressure (hypertension). It may also reduce your risk for type 2 diabetes, heart disease, and stroke.  With the DASH eating plan, you should limit salt (sodium) intake to 2,300 mg a day. If you have hypertension, you may need to reduce your sodium intake to 1,500 mg a day.  When on the DASH eating plan, aim to eat more fresh fruits and vegetables, whole grains, lean proteins, low-fat dairy, and heart-healthy fats.  Work with your health care provider or diet and nutrition specialist (  dietitian) to adjust your eating plan to your individual calorie needs. This information is not intended to replace advice given to you by your health care provider. Make sure you discuss any questions you have with your health care provider. Document Released: 04/12/2011 Document Revised: 04/16/2016 Document Reviewed: 04/16/2016 Elsevier Interactive Patient Education  2019 [employer].

## 2018-06-10 NOTE — Progress Notes (Signed)
Established Patient Office Visit  Subjective:  Patient ID: Monique Edwards, female    DOB: 1965/06/23  Age: 53 y.o. MRN: [id number]  CC:  Chief Complaint  Patient presents with  . 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   She reports that she continues to have bilateral ankle swelling daily, and she experiences occasional intermittent numbness from left knee to thigh. She reports that she stands for 4 hours  daily at her first job and she sits and walks around at her 2nd job for 8 hours and she has had the job for 7-8 years. She reports 50% resolution of the swelling to her ankles when she wakes up in the morning. She reports that the ankle swelling has being going on for more that 1 month. She denies chest pain, palpitation and light headedness.She states that she takes Amlodipine 10 mg daily for BP and doesn't check bp at home. She denies claudication while walking.  She reports intermittent numbness and dull non radiating 4/10 pain to bilateral hands and fingers when she wakes up in the morning and sometimes in the evening.she reports that numbness and pain resolves within 5-10 minutes. She denies any aggravating factor and reports that taking 200 mg ibuprofen offers relief. She reports that numbness to hands and left knee has being going on for 2 months but does not affect her daily activities.  Shortness of breath: She reports experiencing intermittent shortness of breath with acivity and continues to use albuterol as needed and Umeclidium bromide daily. She denies fever, chills, wheezing and cough.  Events: She had EGD done 06/06/18 to evaluate esophageal varices, per Dr Sharlet Salina A, exam was otherwise normal, normal stomach, duodenum , esophagus, and no specimen was collected, she will follow up with GI as scheduled and repeat EGD in 3 years for surveillance. She denies abdominal pain and reports taking  15 ml of Lactulose daily with improvement in her memory. Otherwise she reports that she's doing well and needs to lose weight.  Past Medical History:  Diagnosis Date  . COPD (chronic obstructive pulmonary disease) (HCC)   . Fatty liver   . GERD (gastroesophageal reflux disease)   . Hypertension     Past Surgical History:  Procedure Laterality Date  . CHOLECYSTECTOMY    . ESOPHAGOGASTRODUODENOSCOPY (EGD) WITH PROPOFOL N/A 06/06/2018   Procedure: ESOPHAGOGASTRODUODENOSCOPY (EGD) WITH PROPOFOL;  Surgeon: Wyline Mood, MD;  Location: [facility] ENDOSCOPY;  Service: Gastroenterology;  Laterality: N/A;    Family History  Problem Relation Age of Onset  . Hypertension Mother   . Stroke Mother   . Diabetes Father   . Arthritis Sister   . Thyroid disease Sister   . Hypertension Brother   . Hypertension Brother     Social History   Socioeconomic History  . Marital status: Single    Spouse name: Not on file  . Number of children: Not on file  . Years of education: Not on file  . Highest education level: Not on file  Occupational History  . Not on file  Social Needs  . Financial resource strain: Not on file  . Food insecurity:    Worry: Not on file    Inability: Not on file  . Transportation needs:    Medical: Not on file    Non-medical: Not on file  Tobacco Use  . Smoking status: Current Some Day Smoker  Types: Cigarettes  . Smokeless tobacco: Never Used  . Tobacco comment: occasionally  Substance and Sexual Activity  . Alcohol use: No    Comment: former occassional  . Drug use: No  . Sexual activity: Not on file  Lifestyle  . Physical activity:    Days per week: Not on file    Minutes per session: Not on file  . Stress: Not on file  Relationships  . Social connections:    Talks on phone: Not on file    Gets together: Not on file    Attends religious service: Not on file    Active member of club or organization: Not on file    Attends meetings of clubs or  organizations: Not on file    Relationship status: Not on file  . Intimate partner violence:    Fear of current or ex partner: Not on file    Emotionally abused: Not on file    Physically abused: Not on file    Forced sexual activity: Not on file  Other Topics Concern  . Not on file  Social History Narrative  . Not on file    Outpatient Medications Prior to Visit  Medication Sig Dispense Refill  . albuterol (PROVENTIL HFA;VENTOLIN HFA) 108 (90 Base) MCG/ACT inhaler Inhale 2 puffs into the lungs every 4 (four) hours as needed for wheezing or shortness of breath. 1 Inhaler 3  . benzonatate (TESSALON PERLES) 100 MG capsule Take 1 capsule (100 mg total) by mouth 3 (three) times daily. 15 capsule 0  . ibuprofen (ADVIL,MOTRIN) 600 MG tablet Take 1 tablet (600 mg total) by mouth every 8 (eight) hours as needed. 30 tablet 2  . lactulose (CHRONULAC) 10 GM/15ML solution Take 15 mLs (10 g total) by mouth 2 (two) times daily as needed for mild constipation. 240 mL 4  . omeprazole (PRILOSEC) 20 MG capsule Take 1 capsule (20 mg total) by mouth daily. 90 capsule 3  . tiotropium (SPIRIVA) 18 MCG inhalation capsule Place 1 capsule (18 mcg total) into inhaler and inhale daily. 30 capsule 12  . umeclidinium bromide (INCRUSE ELLIPTA) 62.5 MCG/INH AEPB Inhale 1 puff into the lungs daily.    Marland Kitchen. amLODipine (NORVASC) 10 MG tablet TAKE ONE TABLET BY MOUTH EVERY DAY 85 tablet 0  . sodium chloride (OCEAN) 0.65 % SOLN nasal spray Place 2 sprays into both nostrils 4 (four) times daily for 5 days. 1 Bottle 0   No facility-administered medications prior to visit.     Allergies  Allergen Reactions  . Lisinopril Cough    ROS Review of Systems  Constitutional: Negative.   HENT: Positive for mouth sores (after EGD).   Respiratory: Positive for shortness of breath (with activity).   Cardiovascular: Negative.   Gastrointestinal: Negative.   Genitourinary: Negative.   Musculoskeletal: Positive for joint swelling  (ankle edema).  Skin: Negative.   Neurological: Positive for numbness (left knee to thigh and hands). Negative for seizures.  Hematological: Negative.   Psychiatric/Behavioral: Negative.       Objective:    Physical Exam  Constitutional: She is oriented to person, place, and time. She appears well-developed and well-nourished.  Obese  HENT:  Head: Normocephalic and atraumatic.  Mouth/Throat:    Eyes: Pupils are equal, round, and reactive to light. EOM are normal.  Neck: Normal range of motion.  Cardiovascular: Normal rate and regular rhythm.  Pulmonary/Chest: Effort normal and breath sounds normal.  Abdominal: Soft.  Central obesity  Musculoskeletal:  General: Edema: + 1 edema Bilateral lower extremities and ankles.  Neurological: She is alert and oriented to person, place, and time.  Skin: Skin is warm and dry.  Psychiatric: She has a normal mood and affect. Her behavior is normal. Judgment and thought content normal.    BP 129/76 (BP Location: Left Arm, Patient Position: Sitting)   Pulse 62   Wt 266 lb 14.4 oz (121.1 kg)   LMP 02/28/2014 (Approximate)   SpO2 95%   BMI 48.82 kg/m  Wt Readings from Last 3 Encounters:  06/10/18 266 lb 14.4 oz (121.1 kg)  06/06/18 264 lb (119.7 kg)  05/27/18 268 lb 11.2 oz (121.9 kg)   She gained 2 pounds, was advised to lose weight, exercise 30 minutes daily.  Health Maintenance Due  Topic Date Due  . HIV Screening  03/26/1979  . TETANUS/TDAP  03/26/1983  . COLONOSCOPY  03/25/2014   She reports having colonoscopy done 03/25/14, stool card was provided for screening. There are no preventive care reminders to display for this patient.  Lab Results  Component Value Date   TSH 2.130 03/19/2018   Lab Results  Component Value Date   WBC 4.1 05/21/2018   HGB 11.7 05/21/2018   HCT 34.2 05/21/2018   MCV 83 05/21/2018   PLT 79 (LL) 05/21/2018  She denies bruises, bleeding, hematuria or hematochezia. She was advised to  notify provider for signs of bleeding.  Lab Results  Component Value Date   NA 139 05/21/2018   K 4.2 05/21/2018   CO2 26 05/21/2018   GLUCOSE 96 05/21/2018   BUN 9 05/21/2018   CREATININE 0.80 05/21/2018   BILITOT 0.4 05/21/2018   ALKPHOS 67 05/21/2018   AST 31 05/21/2018   ALT 31 05/21/2018   PROT 7.0 05/21/2018   ALBUMIN 4.4 05/21/2018   CALCIUM 9.3 05/21/2018   ANIONGAP 9 03/04/2018   Lab Results  Component Value Date   CHOL 150 03/19/2018   Lab Results  Component Value Date   HDL 54 03/19/2018   Lab Results  Component Value Date   LDLCALC 73 03/19/2018   Lab Results  Component Value Date   TRIG 114 03/19/2018   Lab Results  Component Value Date   CHOLHDL 2.8 03/19/2018   Lab Results  Component Value Date   HGBA1C 5.7 (H) 03/19/2018   She was advised to continue on low carb diet, exercise 30 minutes daily and lose weight. HgbA1c will be rechecked in 3-6 months.   Assessment & Plan:   Problem List Items Addressed This Visit      Cardiovascular and Mediastinum   Hypertension   Relevant Medications   losartan (COZAAR) 50 MG tablet     Other   Morbid obesity (HCC) - Primary    Other Visit Diagnoses    Peripheral edema       Numbness       Health care maintenance         1. Peripheral edema - She was advised to elevate legs while sitting down, use compression stockings. Amlodipine was discontinued might be causing peripheral edema, and since no claudication and negative holman's sign, DVT was ruled out.  2. Numbness to hands and left knee - She was advised to use hand brace and notify provider for worsening numbness.  3. Morbid obesity (HCC) - She was advised to lose weight, decrease caloric intake and exercise 30 minutes daily.  4. Health care maintenance - She reports having colonoscopy done 03/2014, stool card was  provided for screening.  5. Essential hypertension - Blood pressure is well controlled, goal is < 140/90. Amlodipine was  discontinued due to peripheral edema. She was advised on low salt diet, exercise 30 minutes. She will start 25 mg Losartan daily, and was educated on medication side effects and advised to notify provider for any symptoms. - losartan (COZAAR) 50 MG tablet; Take 0.5 tablets (25 mg total) by mouth daily.  Dispense: 30 tablet; Refill: 0  Meds ordered this encounter  Medications  . losartan (COZAAR) 50 MG tablet    Sig: Take 0.5 tablets (25 mg total) by mouth daily.    Dispense:  30 tablet    Refill:  0    Follow-up: Return in about 2 weeks (around 06/24/2018), or if symptoms worsen or fail to improve.    Devonte Migues Trellis PaganiniE Lisabeth Mian, NP

## 2018-06-11 ENCOUNTER — Telehealth: Payer: Self-pay | Admitting: Pharmacist

## 2018-06-11 NOTE — Telephone Encounter (Signed)
06/11/2018 1:57:57 PM - Ventolin & Incruse Ellipta refills  06/11/2018 Placed refills online with [facility] for Ventolin HFA & Incruse Ellipta 62.5 to release 06/25/2018, order# [id number].Forde Radon

## 2018-06-26 ENCOUNTER — Ambulatory Visit: Payer: Self-pay

## 2018-07-03 ENCOUNTER — Ambulatory Visit: Payer: Self-pay

## 2018-07-09 ENCOUNTER — Other Ambulatory Visit: Payer: Self-pay | Admitting: Gerontology

## 2018-07-09 ENCOUNTER — Ambulatory Visit: Payer: Self-pay | Admitting: Gastroenterology

## 2018-07-09 DIAGNOSIS — I10 Essential (primary) hypertension: Secondary | ICD-10-CM

## 2018-07-10 ENCOUNTER — Other Ambulatory Visit: Payer: Self-pay

## 2018-07-10 ENCOUNTER — Ambulatory Visit: Payer: Self-pay | Admitting: Adult Health Nurse Practitioner

## 2018-07-10 VITALS — BP 118/64 | Temp 97.5°F | Ht 62.0 in | Wt 270.1 lb

## 2018-07-10 DIAGNOSIS — I10 Essential (primary) hypertension: Secondary | ICD-10-CM

## 2018-07-10 MED ORDER — LOSARTAN POTASSIUM 50 MG PO TABS: 25.0000 mg | ORAL_TABLET | Freq: Every day | ORAL | 6 refills | Status: DC

## 2018-07-10 MED ORDER — TIOTROPIUM BROMIDE MONOHYDRATE 18 MCG IN CAPS: 18.0000 ug | ORAL_CAPSULE | Freq: Every day | RESPIRATORY_TRACT | 12 refills | Status: DC

## 2018-07-10 NOTE — Progress Notes (Signed)
  Patient: Monique Edwards Female    DOB: 1965-06-06   53 y.o.   MRN: [id number] Visit Date: 07/10/2018  Today's Provider: Jacelyn Pi, NP   Chief Complaint  Patient presents with  . Follow-up   Subjective:    HPI   Norvasc d/c'd last visit. On Losartan 25mg  tolerating well.  No edema to BLE.  Pt states that medication is working well for her.   Allergies  Allergen Reactions  . Amlodipine Swelling    Peripheral edema  . Lisinopril Cough   Previous Medications   ALBUTEROL (PROVENTIL HFA;VENTOLIN HFA) 108 (90 BASE) MCG/ACT INHALER    Inhale 2 puffs into the lungs every 4 (four) hours as needed for wheezing or shortness of breath.   IBUPROFEN (ADVIL,MOTRIN) 600 MG TABLET    Take 1 tablet (600 mg total) by mouth every 8 (eight) hours as needed.   LACTULOSE (CHRONULAC) 10 GM/15ML SOLUTION    Take 15 mLs (10 g total) by mouth 2 (two) times daily as needed for mild constipation.   OMEPRAZOLE (PRILOSEC) 20 MG CAPSULE    Take 1 capsule (20 mg total) by mouth daily.   SODIUM CHLORIDE (OCEAN) 0.65 % SOLN NASAL SPRAY    Place 2 sprays into both nostrils 4 (four) times daily for 5 days.   UMECLIDINIUM BROMIDE (INCRUSE ELLIPTA) 62.5 MCG/INH AEPB    Inhale 1 puff into the lungs daily.    Review of Systems  All other systems reviewed and are negative.   Social History   Tobacco Use  . Smoking status: Current Some Day Smoker    Types: Cigarettes  . Smokeless tobacco: Never Used  . Tobacco comment: occasionally  Substance Use Topics  . Alcohol use: No    Comment: former occassional   Objective:   BP 118/64   Temp (!) 97.5 F (36.4 C) (Oral)   Ht 5\' 2"  (1.575 m)   Wt 270 lb 1.6 oz (122.5 kg)   LMP 02/28/2014 (Approximate)   BMI 49.40 kg/m   Physical Exam Vitals signs reviewed.  Constitutional:      Appearance: Normal appearance.  Cardiovascular:     Rate and Rhythm: Normal rate and regular rhythm.  Pulmonary:     Effort: Pulmonary effort is normal.     Breath sounds:  Normal breath sounds.  Abdominal:     General: Bowel sounds are normal.     Palpations: Abdomen is soft.  Skin:    General: Skin is warm and dry.  Neurological:     Mental Status: She is alert.         Assessment & Plan:         HTN:  Controlled.   Goal BP <140/90.  Continue current medication regimen.  Encourage low salt diet and exercise.     Jacelyn Pi, NP   [facility]

## 2018-07-20 ENCOUNTER — Emergency Department
Admission: EM | Admit: 2018-07-20 | Discharge: 2018-07-20 | Disposition: A | Discharge status: Home / Self Care | Payer: Self-pay | Attending: Emergency Medicine | Admitting: Emergency Medicine

## 2018-07-20 ENCOUNTER — Encounter: Payer: Self-pay | Admitting: Emergency Medicine

## 2018-07-20 ENCOUNTER — Other Ambulatory Visit: Payer: Self-pay

## 2018-07-20 ENCOUNTER — Emergency Department: Payer: Self-pay

## 2018-07-20 DIAGNOSIS — R079 Chest pain, unspecified: Secondary | ICD-10-CM

## 2018-07-20 DIAGNOSIS — F17210 Nicotine dependence, cigarettes, uncomplicated: Secondary | ICD-10-CM | POA: Insufficient documentation

## 2018-07-20 DIAGNOSIS — J449 Chronic obstructive pulmonary disease, unspecified: Secondary | ICD-10-CM | POA: Insufficient documentation

## 2018-07-20 DIAGNOSIS — I10 Essential (primary) hypertension: Secondary | ICD-10-CM | POA: Insufficient documentation

## 2018-07-20 LAB — TROPONIN I: Troponin I: <0.03 ng/mL

## 2018-07-20 LAB — CBC
HCT: 36.3 % (ref 36.0–46.0)
Hemoglobin: 11.7 g/dL — ABNORMAL LOW (ref 12.0–15.0)
MCH: 27.9 pg (ref 26.0–34.0)
MCHC: 32.2 g/dL (ref 30.0–36.0)
MCV: 86.4 fL (ref 80.0–100.0)
Platelets: 82 10*3/uL — ABNORMAL LOW (ref 150–400)
RBC: 4.2 MIL/uL (ref 3.87–5.11)
RDW: 13 % (ref 11.5–15.5)
WBC: 3.6 10*3/uL — ABNORMAL LOW (ref 4.0–10.5)
nRBC: 0 % (ref 0.0–0.2)

## 2018-07-20 LAB — BASIC METABOLIC PANEL
Anion gap: 8 (ref 5–15)
BUN: 13 mg/dL (ref 6–20)
CHLORIDE: 107 mmol/L (ref 98–111)
CO2: 23 mmol/L (ref 22–32)
Calcium: 9 mg/dL (ref 8.9–10.3)
Creatinine, Ser: 0.93 mg/dL (ref 0.44–1.00)
GFR calc Af Amer: >60 mL/min (ref >60)
GFR calc non Af Amer: >60 mL/min (ref >60)
Glucose, Bld: 147 mg/dL — ABNORMAL HIGH (ref 70–99)
POTASSIUM: 3.9 mmol/L (ref 3.5–5.1)
Sodium: 138 mmol/L (ref 135–145)

## 2018-07-20 MED ORDER — OXYCODONE-ACETAMINOPHEN 5-325 MG PO TABS
2.0000 | ORAL_TABLET | Freq: Once | ORAL | Status: AC
  Administered 2018-07-20: 2 via ORAL
  Filled 2018-07-20: qty 2

## 2018-07-20 MED ORDER — DIAZEPAM 5 MG PO TABS: 5.0000 mg | ORAL_TABLET | Freq: Three times a day (TID) | ORAL | 0 refills | Status: DC | PRN

## 2018-07-20 MED ORDER — DIAZEPAM 5 MG PO TABS
10.0000 mg | ORAL_TABLET | Freq: Once | ORAL | Status: AC
  Administered 2018-07-20: 10 mg via ORAL
  Filled 2018-07-20: qty 2

## 2018-07-20 NOTE — ED Provider Notes (Signed)
[facility] Emergency Department Provider Note       Time seen: ----------------------------------------- 12:54 PM on 07/20/2018 -----------------------------------------   I have reviewed the triage vital signs and the nursing notes.  HISTORY   Chief Complaint Chest Pain    HPI Monique Edwards is a 53 y.o. female with a history of COPD, GERD, hypertension who presents to the ED for chest pain that started 3 days ago.  Pain is constant.  She has had cough with some shortness of breath.  Pain is across the entire chest, walking seems to increase the pain.  She denies fevers, chills or other complaints.  Past Medical History:  Diagnosis Date  . COPD (chronic obstructive pulmonary disease) (HCC)   . Fatty liver   . GERD (gastroesophageal reflux disease)   . Hypertension     Patient Active Problem List   Diagnosis Date Noted  . Acute exacerbation of chronic bronchitis (HCC) 03/13/2018  . Fatty liver 03/13/2018  . Tobacco use disorder 03/13/2018  . Post-nasal drip 02/27/2018  . Morbid obesity (HCC) 10/22/2016  . GERD without esophagitis 10/22/2016  . Chronic pain of right knee 10/16/2016  . Paraspinal muscle spasm 10/16/2016  . Hypertension 10/16/2016  . Fall (on) (from) other stairs and steps, subsequent encounter 07/19/2016    Past Surgical History:  Procedure Laterality Date  . CHOLECYSTECTOMY    . ESOPHAGOGASTRODUODENOSCOPY (EGD) WITH PROPOFOL N/A 06/06/2018   Procedure: ESOPHAGOGASTRODUODENOSCOPY (EGD) WITH PROPOFOL;  Surgeon: Wyline Mood, MD;  Location: [facility] ENDOSCOPY;  Service: Gastroenterology;  Laterality: N/A;    Allergies Amlodipine and Lisinopril  Social History Social History   Tobacco Use  . Smoking status: Current Some Day Smoker    Types: Cigarettes  . Smokeless tobacco: Never Used  . Tobacco comment: occasionally  Substance Use Topics  . Alcohol use: No    Comment: former occassional  . Drug use: No   Review of  Systems Constitutional: Negative for fever. Cardiovascular: Positive for chest pain Respiratory: Positive for cough and shortness of breath Gastrointestinal: Negative for abdominal pain, vomiting and diarrhea. Musculoskeletal: Negative for back pain. Skin: Negative for rash. Neurological: Negative for headaches, focal weakness or numbness.  All systems negative/normal/unremarkable except as stated in the HPI  ____________________________________________   PHYSICAL EXAM:  VITAL SIGNS: ED Triage Vitals  Enc Vitals Group     BP 07/20/18 1102 (!) 157/63     Pulse Rate 07/20/18 1102 61     Resp 07/20/18 1102 16     Temp 07/20/18 1102 98 F (36.7 C)     Temp Source 07/20/18 1102 Oral     SpO2 07/20/18 1102 96 %     Weight 07/20/18 1057 270 lb (122.5 kg)     Height 07/20/18 1057 5\' 2"  (1.575 m)     Head Circumference --      Peak Flow --      Pain Score 07/20/18 1057 3     Pain Loc --      Pain Edu? --      Excl. in GC? --     Constitutional: Alert and oriented. Well appearing and in no distress. Eyes: Conjunctivae are normal. Normal extraocular movements. ENT      Head: Normocephalic and atraumatic.      Nose: No congestion/rhinnorhea.      Mouth/Throat: Mucous membranes are moist.      Neck: No stridor. Cardiovascular: Normal rate, regular rhythm. No murmurs, rubs, or gallops. Respiratory: Normal respiratory effort without tachypnea nor retractions.  Breath sounds are clear and equal bilaterally. No wheezes/rales/rhonchi. Gastrointestinal: Soft and nontender. Normal bowel sounds Musculoskeletal: Nontender with normal range of motion in extremities. No lower extremity tenderness nor edema. Neurologic:  Normal speech and language. No gross focal neurologic deficits are appreciated.  Skin:  Skin is warm, dry and intact. No rash noted. Psychiatric: Mood and affect are normal. Speech and behavior are normal.  ____________________________________________  EKG: Interpreted by  me.  Sinus rhythm with first-degree AV block, rate of 65 bpm, normal axis, normal QT  ____________________________________________  ED COURSE:  As part of my medical decision making, I reviewed the following data within the electronic MEDICAL RECORD NUMBER History obtained from family if available, nursing notes, old chart and ekg, as well as notes from prior ED visits. Patient presented for nonspecific chest pain, we will assess with labs and imaging as indicated at this time.   Procedures ____________________________________________   LABS (pertinent positives/negatives)  Labs Reviewed  BASIC METABOLIC PANEL - Abnormal; Notable for the following components:      Result Value   Glucose, Bld 147 (*)    All other components within normal limits  CBC - Abnormal; Notable for the following components:   WBC 3.6 (*)    Hemoglobin 11.7 (*)    Platelets 82 (*)    All other components within normal limits  TROPONIN I  POC URINE PREG, ED    RADIOLOGY Images were viewed by me Chest x-ray IMPRESSION: Normal chest radiographs.  ____________________________________________   DIFFERENTIAL DIAGNOSIS   Musculoskeletal pain, GERD, anxiety, unstable angina  FINAL ASSESSMENT AND PLAN  Chest pain   Plan: The patient had presented for specific chest pain. Patient's labs were reassuring including a negative troponin. Patient's imaging was unremarkable.  Should be referred to cardiology for outpatient follow-up.  She is low risk for ACS.   [name], MD    Note: This note was generated in part or whole with voice recognition software. Voice recognition is usually quite accurate but there are transcription errors that can and very often do occur. I apologize for any typographical errors that were not detected and corrected.     Emily Filbert, MD 07/20/18 1335

## 2018-07-20 NOTE — ED Triage Notes (Signed)
Here for CP that started 3 days ago. Pain is constant. Has had cough and some SHOB. Unlabored at this time. Color WNL. NAD. Pain is across entire chest.  Walking increases pain.

## 2018-07-25 ENCOUNTER — Ambulatory Visit: Payer: Self-pay | Admitting: Pharmacy Technician

## 2018-07-25 NOTE — Progress Notes (Signed)
Patient scheduled for eligibility appointment at [facility] to complete paperwork for recertification.  Patient did not show for the appointment on July 25, 2018 at 2:00p.m.  Patient did not reschedule eligibility appointment.    [name] Care Manager [facility]

## 2018-07-28 ENCOUNTER — Emergency Department: Payer: Self-pay

## 2018-07-28 ENCOUNTER — Encounter: Payer: Self-pay | Admitting: Emergency Medicine

## 2018-07-28 ENCOUNTER — Emergency Department
Admission: EM | Admit: 2018-07-28 | Discharge: 2018-07-28 | Disposition: A | Discharge status: Home / Self Care | Payer: Self-pay | Attending: Emergency Medicine | Admitting: Emergency Medicine

## 2018-07-28 ENCOUNTER — Telehealth: Payer: Self-pay | Admitting: Pharmacist

## 2018-07-28 ENCOUNTER — Other Ambulatory Visit: Payer: Self-pay

## 2018-07-28 DIAGNOSIS — R059 Cough, unspecified: Secondary | ICD-10-CM

## 2018-07-28 DIAGNOSIS — J449 Chronic obstructive pulmonary disease, unspecified: Secondary | ICD-10-CM | POA: Insufficient documentation

## 2018-07-28 DIAGNOSIS — R05 Cough: Secondary | ICD-10-CM | POA: Insufficient documentation

## 2018-07-28 DIAGNOSIS — F17210 Nicotine dependence, cigarettes, uncomplicated: Secondary | ICD-10-CM | POA: Insufficient documentation

## 2018-07-28 DIAGNOSIS — R0981 Nasal congestion: Secondary | ICD-10-CM

## 2018-07-28 DIAGNOSIS — I10 Essential (primary) hypertension: Secondary | ICD-10-CM | POA: Insufficient documentation

## 2018-07-28 DIAGNOSIS — J441 Chronic obstructive pulmonary disease with (acute) exacerbation: Secondary | ICD-10-CM

## 2018-07-28 LAB — BASIC METABOLIC PANEL
Anion gap: 8 (ref 5–15)
BUN: 12 mg/dL (ref 6–20)
CO2: 25 mmol/L (ref 22–32)
Calcium: 8.6 mg/dL — ABNORMAL LOW (ref 8.9–10.3)
Chloride: 107 mmol/L (ref 98–111)
Creatinine, Ser: 0.73 mg/dL (ref 0.44–1.00)
GFR calc non Af Amer: >60 mL/min (ref >60)
Glucose, Bld: 117 mg/dL — ABNORMAL HIGH (ref 70–99)
Potassium: 3.9 mmol/L (ref 3.5–5.1)
Sodium: 140 mmol/L (ref 135–145)

## 2018-07-28 LAB — CBC WITH DIFFERENTIAL/PLATELET
Abs Immature Granulocytes: 0.03 10*3/uL (ref 0.00–0.07)
BASOS PCT: 1 %
Basophils Absolute: 0 10*3/uL (ref 0.0–0.1)
Eosinophils Absolute: 0.1 10*3/uL (ref 0.0–0.5)
Eosinophils Relative: 2 %
HCT: 34.7 % — ABNORMAL LOW (ref 36.0–46.0)
Hemoglobin: 11.3 g/dL — ABNORMAL LOW (ref 12.0–15.0)
Immature Granulocytes: 1 %
Lymphocytes Relative: 28 %
Lymphs Abs: 1.1 10*3/uL (ref 0.7–4.0)
MCH: 27.9 pg (ref 26.0–34.0)
MCHC: 32.6 g/dL (ref 30.0–36.0)
MCV: 85.7 fL (ref 80.0–100.0)
Monocytes Absolute: 0.3 10*3/uL (ref 0.1–1.0)
Monocytes Relative: 7 %
Neutro Abs: 2.4 10*3/uL (ref 1.7–7.7)
Neutrophils Relative %: 61 %
PLATELETS: 71 10*3/uL — AB (ref 150–400)
RBC: 4.05 MIL/uL (ref 3.87–5.11)
RDW: 13.2 % (ref 11.5–15.5)
WBC: 3.9 10*3/uL — ABNORMAL LOW (ref 4.0–10.5)
nRBC: 0 % (ref 0.0–0.2)

## 2018-07-28 LAB — HEPATIC FUNCTION PANEL
ALT: 25 U/L (ref 0–44)
AST: 25 U/L (ref 15–41)
Albumin: 3.5 g/dL (ref 3.5–5.0)
Alkaline Phosphatase: 52 U/L (ref 38–126)
Bilirubin, Direct: <0.1 mg/dL (ref 0.0–0.2)
Total Bilirubin: 0.3 mg/dL (ref 0.3–1.2)
Total Protein: 7.4 g/dL (ref 6.5–8.1)

## 2018-07-28 LAB — CBC
HEMATOCRIT: 35.2 % — AB (ref 36.0–46.0)
Hemoglobin: 11.4 g/dL — ABNORMAL LOW (ref 12.0–15.0)
MCH: 27.9 pg (ref 26.0–34.0)
MCHC: 32.4 g/dL (ref 30.0–36.0)
MCV: 86.3 fL (ref 80.0–100.0)
Platelets: 71 10*3/uL — ABNORMAL LOW (ref 150–400)
RBC: 4.08 MIL/uL (ref 3.87–5.11)
RDW: 13.2 % (ref 11.5–15.5)
WBC: 3.8 10*3/uL — ABNORMAL LOW (ref 4.0–10.5)
nRBC: 0 % (ref 0.0–0.2)

## 2018-07-28 LAB — INFLUENZA PANEL BY PCR (TYPE A & B)
Influenza A By PCR: NEGATIVE
Influenza B By PCR: NEGATIVE

## 2018-07-28 MED ORDER — PREDNISONE 10 MG PO TABS: 40.0000 mg | ORAL_TABLET | Freq: Every day | ORAL | 0 refills | Status: DC

## 2018-07-28 NOTE — ED Provider Notes (Addendum)
[facility] Emergency Department Provider Note  ____________________________________________  Time seen: Approximately 9:21 AM  I have reviewed the triage vital signs and the nursing notes.   HISTORY  Chief Complaint Cough    HPI Monique Edwards is a 53 y.o. female w/ a hx of COPD and ongoing tobacco abuse, HTN, presenting with cough, congestion, sore throat, and shortness of breath.  The patient reports that for the past several days, she has had a dry cough with congestion and a sore throat, without fevers or chills.  She has had exertional shortness of breath.  She has tried over-the-counter cough and cold symptoms, as well as her to COPD inhalers without improvement.  She has also had diarrhea without abdominal pain or nausea or vomiting.  She denies any sick contacts and did get her influenza vaccination this year.  No recent travel.  Past Medical History:  Diagnosis Date  . COPD (chronic obstructive pulmonary disease) (HCC)   . Fatty liver   . GERD (gastroesophageal reflux disease)   . Hypertension     Patient Active Problem List   Diagnosis Date Noted  . Acute exacerbation of chronic bronchitis (HCC) 03/13/2018  . Fatty liver 03/13/2018  . Tobacco use disorder 03/13/2018  . Post-nasal drip 02/27/2018  . Morbid obesity (HCC) 10/22/2016  . GERD without esophagitis 10/22/2016  . Chronic pain of right knee 10/16/2016  . Paraspinal muscle spasm 10/16/2016  . Hypertension 10/16/2016  . Fall (on) (from) other stairs and steps, subsequent encounter 07/19/2016    Past Surgical History:  Procedure Laterality Date  . CHOLECYSTECTOMY    . ESOPHAGOGASTRODUODENOSCOPY (EGD) WITH PROPOFOL N/A 06/06/2018   Procedure: ESOPHAGOGASTRODUODENOSCOPY (EGD) WITH PROPOFOL;  Surgeon: Wyline Mood, MD;  Location: [facility] ENDOSCOPY;  Service: Gastroenterology;  Laterality: N/A;    Current Outpatient Rx  . Order #: [id number] Class: Normal  . Order #: [id number] Class: Print  .  Order #: [id number] Class: Normal  . Order #: [id number] Class: Normal  . Order #: [id number] Class: Normal  . Order #: [id number] Class: Normal  . Order #: [id number] Class: Normal  . Order #: [id number] Class: Normal  . Order #: [id number] Class: Normal  . Order #: [id number] Class: Historical Med    Allergies Amlodipine and Lisinopril  Family History  Problem Relation Age of Onset  . Hypertension Mother   . Stroke Mother   . Diabetes Father   . Arthritis Sister   . Thyroid disease Sister   . Hypertension Brother   . Hypertension Brother     Social History Social History   Tobacco Use  . Smoking status: Current Some Day Smoker    Types: Cigarettes  . Smokeless tobacco: Never Used  . Tobacco comment: occasionally  Substance Use Topics  . Alcohol use: No    Comment: former occassional  . Drug use: No    Review of Systems Constitutional: No fever/chills.  No lightheadedness or syncope. Eyes: No visual changes.  No eye discharge. ENT: Positive sore throat.  Positive congestion or rhinorrhea. Cardiovascular: Denies chest pain. Denies palpitations. Respiratory: Positive exertional shortness of breath.  Positive dry cough. Gastrointestinal: No abdominal pain.  No nausea, no vomiting.  No diarrhea.  No constipation. Genitourinary: Negative for dysuria. Musculoskeletal: Negative for back pain.  No lower extremity swelling or calf pain. Skin: Negative for rash. Neurological: Negative for headaches. No focal numbness, tingling or weakness.     ____________________________________________   PHYSICAL EXAM:  VITAL SIGNS: ED Triage Vitals  Enc Vitals Group     BP 07/28/18 0846 Marland Kitchen)  149/72     Pulse Rate 07/28/18 0846 (!) 59     Resp 07/28/18 0846 20     Temp 07/28/18 0846 98.2 F (36.8 C)     Temp Source 07/28/18 0846 Oral     SpO2 07/28/18 0846 97 %     Weight 07/28/18 0849 270 lb (122.5 kg)     Height 07/28/18 0849 5\' 2"  (1.575 m)     Head Circumference --      Peak Flow --       Pain Score 07/28/18 0848 3     Pain Loc --      Pain Edu? --      Excl. in GC? --     Constitutional: Alert and oriented. Answers questions appropriately.  Chronically ill-appearing. Eyes: Conjunctivae are normal.  EOMI. No scleral icterus.  No eye discharge. Head: Atraumatic. Nose: Positive congestion. Neck: No stridor.  Supple.  No JVD.  No meningismus. Cardiovascular: Normal rate, regular rhythm. No murmurs, rubs or gallops.  Respiratory: Normal respiratory effort.  No accessory muscle use or retractions. Lungs CTAB.  No wheezes, rales or ronchi. Gastrointestinal: Obese.  Soft, nontender and nondistended.  No guarding or rebound.  No peritoneal signs. Musculoskeletal: No LE edema. No ttp in the calves or palpable cords.  Negative Homan's sign. Neurologic:  A&Ox3.  Speech is clear.  Face and smile are symmetric.  EOMI.  Moves all extremities well. Skin:  Skin is warm, dry and intact. No rash noted. Psychiatric: Mood and affect are normal. Speech and behavior are normal.  Normal judgement  ____________________________________________   LABS (all labs ordered are listed, but only abnormal results are displayed)  Labs Reviewed  CBC  BASIC METABOLIC PANEL  INFLUENZA PANEL BY PCR (TYPE A & B)   ____________________________________________  EKG  Not indicated ____________________________________________  RADIOLOGY  No results found.  ____________________________________________   PROCEDURES  Procedure(s) performed: None  Procedures  Critical Care performed: No ____________________________________________   INITIAL IMPRESSION / ASSESSMENT AND PLAN / ED COURSE  Pertinent labs & imaging results that were available during my care of the patient were reviewed by me and considered in my medical decision making (see chart for details).  53 y.o. female with a history of COPD presenting with several days of cough, congestion, shortness of breath and diarrhea.   Overall, the patient is hemodynamically stable, afebrile and maintains oxygen saturations of at least 96% on room air, even with ambulation.  Her symptoms are likely due to an infectious process, with COPD exacerbation.  We will get a chest x-ray to rule out pneumonia.  Influenza swab is pending.  The patient will have laboratory studies pending.  Coronavirus is on the differential, and I have given the patient specific instructions about self-isolation for 14 days, and will provide her with more detailed information in her discharge paperwork.  Noninfectious causes including ACS or MI, congestive heart failure, PE, are considered but much less likely.  ----------------------------------------- 10:14 AM on 07/28/2018 -----------------------------------------  The patient's work-up in the emergency department does not show any infiltrate on her chest x-ray and she is negative for influenza.  Her electrolytes are reassuring.  She has a white blood cell count of 3.8, which appears to be baseline for her.  She also has low platelets, which is also baseline for her.  She was able to maintain normal oxygen saturations with ambulation.  At this time, the patient will be discharged home with prednisone for 5 days given her underlying COPD.  She understands that some data shows that steroid medications may worsen the course of coronavirus.  At this time, we are not able to test her for coronavirus, but there are many etiologies which could be causing her symptoms including other viruses, or COPD exacerbation alone.  Follow-up instructions as well as discharge precautions were discussed.  ____________________________________________  FINAL CLINICAL IMPRESSION(S) / ED DIAGNOSES  Final diagnoses:  Cough  Nose congestion         NEW MEDICATIONS STARTED DURING THIS VISIT:  New Prescriptions   No medications on file      Rockne Menghini, MD 07/28/18 1610    Rockne Menghini, MD 07/28/18  1015

## 2018-07-28 NOTE — Discharge Instructions (Addendum)
Return to the emergency department for shortness of breath, lightheadedness or fainting, fever, inability to keep down fluids, or any other symptoms concerning to you.    Person Under Monitoring Name: Monique Edwards  Location: [address] [place] [address]   Infection Prevention Recommendations for Individuals Confirmed to have, or Being Evaluated for, 2019 Novel Coronavirus (COVID-19) Infection Who Receive Care at Home  Individuals who are confirmed to have, or are being evaluated for, COVID-19 should follow the prevention steps below until a healthcare provider or local or state health department says they can return to normal activities.  Stay home except to get medical care You should restrict activities outside your home, except for getting medical care. Do not go to work, school, or public areas, and do not use public transportation or taxis.  Call ahead before visiting your doctor Before your medical appointment, call the healthcare provider and tell them that you have, or are being evaluated for, COVID-19 infection. This will help the healthcare providers office take steps to keep other people from getting infected. Ask your healthcare provider to call the local or state health department.  Monitor your symptoms Seek prompt medical attention if your illness is worsening (e.g., difficulty breathing). Before going to your medical appointment, call the healthcare provider and tell them that you have, or are being evaluated for, COVID-19 infection. Ask your healthcare provider to call the local or state health department.  Wear a facemask You should wear a facemask that covers your nose and mouth when you are in the same room with other people and when you visit a healthcare provider. People who live with or visit you should also wear a facemask while they are in the same room with you.  Separate yourself from other people in your home As much as possible, you should  stay in a different room from other people in your home. Also, you should use a separate bathroom, if available.  Avoid sharing household items You should not share dishes, drinking glasses, cups, eating utensils, towels, bedding, or other items with other people in your home. After using these items, you should wash them thoroughly with soap and water.  Cover your coughs and sneezes Cover your mouth and nose with a tissue when you cough or sneeze, or you can cough or sneeze into your sleeve. Throw used tissues in a lined trash can, and immediately wash your hands with soap and water for at least 20 seconds or use an alcohol-based hand rub.  Wash your [employer] your hands often and thoroughly with soap and water for at least 20 seconds. You can use an alcohol-based hand sanitizer if soap and water are not available and if your hands are not visibly dirty. Avoid touching your eyes, nose, and mouth with unwashed hands.   Prevention Steps for Caregivers and Household Members of Individuals Confirmed to have, or Being Evaluated for, COVID-19 Infection Being Cared for in the Home  If you live with, or provide care at home for, a person confirmed to have, or being evaluated for, COVID-19 infection please follow these guidelines to prevent infection:  Follow healthcare providers instructions Make sure that you understand and can help the patient follow any healthcare provider instructions for all care.  Provide for the patients basic needs You should help the patient with basic needs in the home and provide support for getting groceries, prescriptions, and other personal needs.  Monitor the patients symptoms If they are getting sicker,  call his or her medical provider and tell them that the patient has, or is being evaluated for, COVID-19 infection. This will help the healthcare providers office take steps to keep other people from getting infected. Ask the healthcare provider to call  the local or state health department.  Limit the number of people who have contact with the patient If possible, have only one caregiver for the patient. Other household members should stay in another home or place of residence. If this is not possible, they should stay in another room, or be separated from the patient as much as possible. Use a separate bathroom, if available. Restrict visitors who do not have an essential need to be in the home.  Keep older adults, very young children, and other sick people away from the patient Keep older adults, very young children, and those who have compromised immune systems or chronic health conditions away from the patient. This includes people with chronic heart, lung, or kidney conditions, diabetes, and cancer.  Ensure good ventilation Make sure that shared spaces in the home have good air flow, such as from an air conditioner or an opened window, weather permitting.  Wash your hands often Wash your hands often and thoroughly with soap and water for at least 20 seconds. You can use an alcohol based hand sanitizer if soap and water are not available and if your hands are not visibly dirty. Avoid touching your eyes, nose, and mouth with unwashed hands. Use disposable paper towels to dry your hands. If not available, use dedicated cloth towels and replace them when they become wet.  Wear a facemask and gloves Wear a disposable facemask at all times in the room and gloves when you touch or have contact with the patients blood, body fluids, and/or secretions or excretions, such as sweat, saliva, sputum, nasal mucus, vomit, urine, or feces.  Ensure the mask fits over your nose and mouth tightly, and do not touch it during use. Throw out disposable facemasks and gloves after using them. Do not reuse. Wash your hands immediately after removing your facemask and gloves. If your personal clothing becomes contaminated, carefully remove clothing and launder.  Wash your hands after handling contaminated clothing. Place all used disposable facemasks, gloves, and other waste in a lined container before disposing them with other household waste. Remove gloves and wash your hands immediately after handling these items.  Do not share dishes, glasses, or other household items with the patient Avoid sharing household items. You should not share dishes, drinking glasses, cups, eating utensils, towels, bedding, or other items with a patient who is confirmed to have, or being evaluated for, COVID-19 infection. After the person uses these items, you should wash them thoroughly with soap and water.  Wash laundry thoroughly Immediately remove and wash clothes or bedding that have blood, body fluids, and/or secretions or excretions, such as sweat, saliva, sputum, nasal mucus, vomit, urine, or feces, on them. Wear gloves when handling laundry from the patient. Read and follow directions on labels of laundry or clothing items and detergent. In general, wash and dry with the warmest temperatures recommended on the label.  Clean all areas the individual has used often Clean all touchable surfaces, such as counters, tabletops, doorknobs, bathroom fixtures, toilets, phones, keyboards, tablets, and bedside tables, every day. Also, clean any surfaces that may have blood, body fluids, and/or secretions or excretions on them. Wear gloves when cleaning surfaces the patient has come in contact with. Use a diluted bleach solution (  e.g., dilute bleach with 1 part bleach and 10 parts water) or a household disinfectant with a label that says EPA-registered for coronaviruses. To make a bleach solution at home, add 1 tablespoon of bleach to 1 quart (4 cups) of water. For a larger supply, add  cup of bleach to 1 gallon (16 cups) of water. Read labels of cleaning products and follow recommendations provided on product labels. Labels contain instructions for safe and effective use of the  cleaning product including precautions you should take when applying the product, such as wearing gloves or eye protection and making sure you have good ventilation during use of the product. Remove gloves and wash hands immediately after cleaning.  Monitor yourself for signs and symptoms of illness Caregivers and household members are considered close contacts, should monitor their health, and will be asked to limit movement outside of the home to the extent possible. Follow the monitoring steps for close contacts listed on the symptom monitoring form.   ? If you have additional questions, contact your local health department or call the epidemiologist on call at [phone] (available 24/7). ? This guidance is subject to change. For the most up-to-date guidance from [facility], please refer to their website: [employer].hu

## 2018-07-28 NOTE — ED Triage Notes (Signed)
Cough x 2 days

## 2018-07-28 NOTE — ED Notes (Signed)
Patient's oxygen level only dropped to 96% while ambulating.  MD notified.

## 2018-07-28 NOTE — Telephone Encounter (Signed)
07/28/2018 3:36:33 PM - refills-Ventolin HFA & Incruse Ellipta 62.5  07/28/2018 Placed refill online with [facility] for Ventolin HFA & Incruse Ellipta 62.5-order# [id number].Forde Radon

## 2018-07-28 NOTE — ED Notes (Signed)
First Nurse Note: Patient has cough and congestion.  Denies travel outside the state.  Given mask to wear.

## 2018-07-31 ENCOUNTER — Ambulatory Visit: Payer: Self-pay | Admitting: Adult Health Nurse Practitioner

## 2018-07-31 DIAGNOSIS — J441 Chronic obstructive pulmonary disease with (acute) exacerbation: Secondary | ICD-10-CM

## 2018-07-31 DIAGNOSIS — J449 Chronic obstructive pulmonary disease, unspecified: Secondary | ICD-10-CM | POA: Insufficient documentation

## 2018-07-31 MED ORDER — GUAIFENESIN 100 MG/5ML PO SOLN: 5.0000 mL | ORAL | 0 refills | Status: DC | PRN

## 2018-07-31 MED ORDER — AZITHROMYCIN 250 MG PO TABS: ORAL_TABLET | ORAL | 0 refills | Status: DC

## 2018-07-31 NOTE — Progress Notes (Signed)
  Patient: Monique Edwards Female    DOB: Sep 12, 1965   53 y.o.   MRN: [id number] Visit Date: 07/31/2018  Today's Provider: [facility]   No chief complaint on file.  Subjective:    HPI   Telephonic visit  Recently seen in the ED for productive cough- CXR and flu negative. Told to self-quarantine due to potential COVID-19. She was given rx for steroids.  States that she is still congested and coughing- yellow thick mucous.   Pt states that she is self- quarantine.       Allergies  Allergen Reactions  . Amlodipine Swelling    Peripheral edema  . Lisinopril Cough   Previous Medications   ALBUTEROL (PROVENTIL HFA;VENTOLIN HFA) 108 (90 BASE) MCG/ACT INHALER    Inhale 2 puffs into the lungs every 4 (four) hours as needed for wheezing or shortness of breath.   DIAZEPAM (VALIUM) 5 MG TABLET    Take 1 tablet (5 mg total) by mouth every 8 (eight) hours as needed for muscle spasms.   IBUPROFEN (ADVIL,MOTRIN) 600 MG TABLET    Take 1 tablet (600 mg total) by mouth every 8 (eight) hours as needed.   LACTULOSE (CHRONULAC) 10 GM/15ML SOLUTION    Take 15 mLs (10 g total) by mouth 2 (two) times daily as needed for mild constipation.   LOSARTAN (COZAAR) 25 MG TABLET    TAKE ONE TABLET BY MOUTH EVERY DAY   LOSARTAN (COZAAR) 50 MG TABLET    Take 0.5 tablets (25 mg total) by mouth daily.   OMEPRAZOLE (PRILOSEC) 20 MG CAPSULE    Take 1 capsule (20 mg total) by mouth daily.   PREDNISONE (DELTASONE) 10 MG TABLET    Take 4 tablets (40 mg total) by mouth daily.   SODIUM CHLORIDE (OCEAN) 0.65 % SOLN NASAL SPRAY    Place 2 sprays into both nostrils 4 (four) times daily for 5 days.   TIOTROPIUM (SPIRIVA) 18 MCG INHALATION CAPSULE    Place 1 capsule (18 mcg total) into inhaler and inhale daily.   UMECLIDINIUM BROMIDE (INCRUSE ELLIPTA) 62.5 MCG/INH AEPB    Inhale 1 puff into the lungs daily.    Review of Systems  Constitutional: Positive for chills. Negative for fever.  Respiratory: Positive for  cough.   Gastrointestinal: Negative for diarrhea and nausea.    Social History   Tobacco Use  . Smoking status: Current Some Day Smoker    Types: Cigarettes  . Smokeless tobacco: Never Used  . Tobacco comment: occasionally  Substance Use Topics  . Alcohol use: No    Comment: former occassional   Objective:   LMP 02/28/2014 (Approximate)   Physical Exam  No PE.     Assessment & Plan:        COPD exacerbation vs COVID: Continue Prednisone- re-instructed on how to take appropriately.  Z-pack.  Increase PO fluid intake.  Guaifenesin syrup PRN.  Continue to self-quarantine for 14 days.  Good hand hygiene.  RTC is symptoms not improving - ED precautions given.    [facility]   [facility]

## 2018-08-07 ENCOUNTER — Ambulatory Visit: Payer: Self-pay | Admitting: Adult Health Nurse Practitioner

## 2018-08-07 ENCOUNTER — Encounter: Payer: Self-pay | Admitting: Adult Health Nurse Practitioner

## 2018-08-07 DIAGNOSIS — J441 Chronic obstructive pulmonary disease with (acute) exacerbation: Secondary | ICD-10-CM

## 2018-08-07 NOTE — Progress Notes (Signed)
  Patient: Monique Edwards Female    DOB: November 14, 1965   53 y.o.   MRN: [id number] Visit Date: 08/07/2018  Today's Provider: [facility]   No chief complaint on file.  Subjective:    HPI   Telephonic visit   States that she is still having some slight cough and congestion.  States that she feels better- wants to discuss when she can go back to work.     Allergies  Allergen Reactions  . Amlodipine Swelling    Peripheral edema  . Lisinopril Cough   Previous Medications   ALBUTEROL (PROVENTIL HFA;VENTOLIN HFA) 108 (90 BASE) MCG/ACT INHALER    Inhale 2 puffs into the lungs every 4 (four) hours as needed for wheezing or shortness of breath.   AZITHROMYCIN (ZITHROMAX) 250 MG TABLET    2 tablets day 1 and then 1 tablet daily until complete   DIAZEPAM (VALIUM) 5 MG TABLET    Take 1 tablet (5 mg total) by mouth every 8 (eight) hours as needed for muscle spasms.   GUAIFENESIN (ROBITUSSIN) 100 MG/5ML SOLN    Take 5 mLs (100 mg total) by mouth every 4 (four) hours as needed for cough or to loosen phlegm.   IBUPROFEN (ADVIL,MOTRIN) 600 MG TABLET    Take 1 tablet (600 mg total) by mouth every 8 (eight) hours as needed.   LACTULOSE (CHRONULAC) 10 GM/15ML SOLUTION    Take 15 mLs (10 g total) by mouth 2 (two) times daily as needed for mild constipation.   LOSARTAN (COZAAR) 25 MG TABLET    TAKE ONE TABLET BY MOUTH EVERY DAY   LOSARTAN (COZAAR) 50 MG TABLET    Take 0.5 tablets (25 mg total) by mouth daily.   OMEPRAZOLE (PRILOSEC) 20 MG CAPSULE    Take 1 capsule (20 mg total) by mouth daily.   PREDNISONE (DELTASONE) 10 MG TABLET    Take 4 tablets (40 mg total) by mouth daily.   SODIUM CHLORIDE (OCEAN) 0.65 % SOLN NASAL SPRAY    Place 2 sprays into both nostrils 4 (four) times daily for 5 days.   TIOTROPIUM (SPIRIVA) 18 MCG INHALATION CAPSULE    Place 1 capsule (18 mcg total) into inhaler and inhale daily.   UMECLIDINIUM BROMIDE (INCRUSE ELLIPTA) 62.5 MCG/INH AEPB    Inhale 1 puff into the lungs  daily.    Review of Systems  Respiratory: Positive for cough.     Social History   Tobacco Use  . Smoking status: Current Some Day Smoker    Types: Cigarettes  . Smokeless tobacco: Never Used  . Tobacco comment: occasionally  Substance Use Topics  . Alcohol use: No    Comment: former occassional   Objective:   LMP 02/28/2014 (Approximate)   Physical Exam   No PE.    Assessment & Plan:        Continue quarantine until 4.6.20- may return to work on 4.7.  Continue supportive care.    [facility]   [facility]

## 2018-08-14 ENCOUNTER — Ambulatory Visit: Payer: Self-pay | Admitting: Urology

## 2018-08-14 DIAGNOSIS — J301 Allergic rhinitis due to pollen: Secondary | ICD-10-CM

## 2018-08-14 MED ORDER — GUAIFENESIN 100 MG/5ML PO SOLN: 5.0000 mL | ORAL | 0 refills | Status: DC | PRN

## 2018-08-14 MED ORDER — FLUTICASONE PROPIONATE 50 MCG/ACT NA SUSP: 2.0000 | Freq: Every day | NASAL | 2 refills | Status: DC

## 2018-08-14 NOTE — Progress Notes (Signed)
Virtual Visit via Telephone Note  I connected with Zamorah Deno on 08/14/2018 at 1710 by telephone and verified that I am speaking with the correct person using two identifiers.  They are located at work.    I am located at my home.    This visit type was conducted due to national recommendations for restrictions regarding the COVID-19 Pandemic (e.g. social distancing).  This format is felt to be most appropriate for this patient at this time.  All issues noted in this document were discussed and addressed.  No physical exam was performed.   I discussed the limitations, risks, security and privacy concerns of performing an evaluation and management service by telephone and the availability of in person appointments. I also discussed with the patient that there may be a patient responsible charge related to this service. The patient expressed understanding and agreed to proceed.   History of Present Illness: Mrs. Bleich is a 53 year old female with COPD, GERD and chronic right knee pain.  She is still having issues with coughing.  She is not having fevers, muscle aches or SOB.  She is using her inhalers.  She is having rhinitis and has a history of allergies.   She has cut down on smoker.    Observations/Objective:   Assessment and Plan:  1. Allergic rhinitis Sent in script for Flonase and Robitussin   Follow Up Instructions:  Patient call back if symptoms worsen   I discussed the assessment and treatment plan with the patient. The patient was provided an opportunity to ask questions and all were answered. The patient agreed with the plan and demonstrated an understanding of the instructions.   The patient was advised to call back or seek an in-person evaluation if the symptoms worsen or if the condition fails to improve as anticipated.  I provided 10 minutes of non-face-to-face time during this encounter.   Avarose Mervine, PA-C

## 2018-08-21 ENCOUNTER — Ambulatory Visit: Payer: Self-pay | Admitting: Gastroenterology

## 2018-08-21 ENCOUNTER — Ambulatory Visit (INDEPENDENT_AMBULATORY_CARE_PROVIDER_SITE_OTHER): Payer: Self-pay | Admitting: Gastroenterology

## 2018-08-21 DIAGNOSIS — K7682 Hepatic encephalopathy: Secondary | ICD-10-CM

## 2018-08-21 DIAGNOSIS — K7290 Hepatic failure, unspecified without coma: Secondary | ICD-10-CM

## 2018-08-21 DIAGNOSIS — K7460 Unspecified cirrhosis of liver: Secondary | ICD-10-CM

## 2018-08-21 NOTE — Progress Notes (Signed)
Monique Edwards , MD [address]  [address]  [place], [place] [address]  Main: [phone]  Fax: [phone]   Primary Care Physician: Monique Edwards, Monique D, MD  Virtual Visit via Telephone Note  I connected with patient on 08/21/18 at 10:00 AM EDT by telephone and verified that I am speaking with the correct person using two identifiers.   I discussed the limitations, risks, security and privacy concerns of performing an evaluation and management service by telephone and the availability of in person appointments. I also discussed with the patient that there may be a patient responsible charge related to this service. The patient expressed understanding and agreed to proceed.  Location of Patient: Home Location of Provider: Home Persons involved: Patient and provider only   History of Present Illness: Chief Complaint  Patient presents with  . Follow-up    Cirrhosis of Liver    HPI: Monique Edwards is a 53 y.o. female   Summary of history :  She was initially  referred for hepatitis C and cirrhosis of the liver in 05/2018 . 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 Achieved SVR after treatment. Cured in 2015 . She was compensated in terms of her cirrhosis back in 2017. EGD in 2017 showed PHG and no varices.  Does drink occasional alcohol , some years back used to drink a lot . She suffers from HTN, possible diabetes, obesity . Last colonoscopy in 2017 and two small adenomas excised. Does not smoke.   Interval history   05/21/2018-  08/21/2018  06/06/2018: EGD:  No varices- repeat in 2023 05/23/2018: RUQUSG: no HCC 05/2018: no hcv virus in blood, needs Hep A/ B vaccine   No new issues, doing well, no confusion. Gaining weight     Current Outpatient Medications  Medication Sig Dispense Refill  . fluticasone (FLONASE) 50 MCG/ACT nasal spray Place 2 sprays into both nostrils daily. 16 g 2  . guaiFENesin (ROBITUSSIN) 100 MG/5ML SOLN  Take 5 mLs (100 mg total) by mouth every 4 (four) hours as needed for cough or to loosen phlegm. 236 mL 0  . ibuprofen (ADVIL,MOTRIN) 600 MG tablet Take 1 tablet (600 mg total) by mouth every 8 (eight) hours as needed. 30 tablet 2  . lactulose (CHRONULAC) 10 GM/15ML solution Take 15 mLs (10 g total) by mouth 2 (two) times daily as needed for mild constipation. 240 mL 4  . losartan (COZAAR) 25 MG tablet TAKE ONE TABLET BY MOUTH EVERY DAY 30 tablet 0  . losartan (COZAAR) 50 MG tablet Take 0.5 tablets (25 mg total) by mouth daily. 30 tablet 6  . omeprazole (PRILOSEC) 20 MG capsule Take 1 capsule (20 mg total) by mouth daily. 90 capsule 3  . tiotropium (SPIRIVA) 18 MCG inhalation capsule Place 1 capsule (18 mcg total) into inhaler and inhale daily. 30 capsule 12  . umeclidinium bromide (INCRUSE ELLIPTA) 62.5 MCG/INH AEPB Inhale 1 puff into the lungs daily.    Marland Kitchen. albuterol (PROVENTIL HFA;VENTOLIN HFA) 108 (90 Base) MCG/ACT inhaler Inhale 2 puffs into the lungs every 4 (four) hours as needed for wheezing or shortness of breath. (Patient not taking: Reported on 07/10/2018) 1 Inhaler 3  . azithromycin (ZITHROMAX) 250 MG tablet 2 tablets day 1 and then 1 tablet daily until complete (Patient not taking: Reported on 08/21/2018) 6 each 0  . diazepam (VALIUM) 5 MG tablet Take 1 tablet (5 mg total) by mouth every 8 (eight)  hours as needed for muscle spasms. (Patient not taking: Reported on 08/21/2018) 20 tablet 0  . predniSONE (DELTASONE) 10 MG tablet Take 4 tablets (40 mg total) by mouth daily. (Patient not taking: Reported on 08/21/2018) 30 tablet 0  . sodium chloride (OCEAN) 0.65 % SOLN nasal spray Place 2 sprays into both nostrils 4 (four) times daily for 5 days. 1 Bottle 0   No current facility-administered medications for this visit.     Allergies as of 08/21/2018 - Review Complete 08/21/2018  Allergen Reaction Noted  . Amlodipine Swelling 06/10/2018  . Lisinopril Cough 03/17/2018    Review of Systems:     All systems reviewed and negative except where noted in HPI.   Observations/Objective:  Labs: CMP     Component Value Date/Time   NA 140 07/28/2018 0927   NA 139 05/21/2018 1123   K 3.9 07/28/2018 0927   CL 107 07/28/2018 0927   CO2 25 07/28/2018 0927   GLUCOSE 117 (H) 07/28/2018 0927   BUN 12 07/28/2018 0927   BUN 9 05/21/2018 1123   CREATININE 0.73 07/28/2018 0927   CALCIUM 8.6 (L) 07/28/2018 0927   PROT 7.4 07/28/2018 0927   PROT 7.0 05/21/2018 1123   ALBUMIN 3.5 07/28/2018 0927   ALBUMIN 4.4 05/21/2018 1123   AST 25 07/28/2018 0927   ALT 25 07/28/2018 0927   ALKPHOS 52 07/28/2018 0927   BILITOT 0.3 07/28/2018 0927   BILITOT 0.4 05/21/2018 1123   GFRNONAA >60 07/28/2018 0927   GFRAA >60 07/28/2018 0927   Lab Results  Component Value Date   WBC 3.8 (L) 07/28/2018   WBC 3.9 (L) 07/28/2018   HGB 11.4 (L) 07/28/2018   HGB 11.3 (L) 07/28/2018   HCT 35.2 (L) 07/28/2018   HCT 34.7 (L) 07/28/2018   MCV 86.3 07/28/2018   MCV 85.7 07/28/2018   PLT 71 (L) 07/28/2018   PLT 71 (L) 07/28/2018    Imaging Studies: Dg Chest Portable 1 View  Result Date: 07/28/2018 CLINICAL DATA:  Cough.  Shortness of breath. EXAM: PORTABLE CHEST 1 VIEW COMPARISON:  Radiographs of July 20, 2018. FINDINGS: The heart size and mediastinal contours are within normal limits. Both lungs are clear. No pneumothorax or pleural effusion is noted. The visualized skeletal structures are unremarkable. IMPRESSION: No active disease. Electronically Signed   By: Lupita Raider, M.D.   On: 07/28/2018 10:01    Assessment and Plan:   Monique Edwards is a 53 y.o. y/o female here to follow up  for cirrhosis of the liver . As per old notes has been cured in 2015 from hepatitis C.  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 EGD to screen for Varices in 05/2021 2. Labs to calculate MELD score 3. RUQ USG to screen for  [facility] in 11/2018 4. Needs  Hep A/B vaccine 5. Counseled on life style changes, low calorie diet , stop sodas, fast food, low salt diet ,  6. Continue lactulose  I discussed the assessment and treatment plan with the patient. The patient was provided an opportunity to ask questions and all were answered. The patient agreed with the plan and demonstrated an understanding of the instructions.   The patient was advised to call back or seek an in-person evaluation if the symptoms worsen or if the condition fails to improve as anticipated.  I provided 12 minutes of non-face-to-face time during this encounter.  Dr Sharlet Salina  Vicente Males MD,MRCP [facility]) Gastroenterology/Hepatology Pager: [phone]   Speech recognition software was used to dictate this note.

## 2018-09-02 ENCOUNTER — Other Ambulatory Visit: Payer: Self-pay

## 2018-09-04 ENCOUNTER — Ambulatory Visit: Payer: Self-pay | Admitting: Adult Health Nurse Practitioner

## 2018-09-04 ENCOUNTER — Other Ambulatory Visit: Payer: Self-pay

## 2018-09-04 DIAGNOSIS — I10 Essential (primary) hypertension: Secondary | ICD-10-CM

## 2018-09-04 DIAGNOSIS — R7303 Prediabetes: Secondary | ICD-10-CM

## 2018-09-04 NOTE — Progress Notes (Signed)
  Patient: Monique Edwards Female    DOB: 17-Feb-1966   53 y.o.   MRN: [id number] Visit Date: 09/04/2018  Today's Provider: [facility]   No chief complaint on file.  Subjective:    HPI   Telephonic visit.  States that she is having headaches and coughing x 3 days.  Has not tried any OTC medications.  Known to have seasonal allergies.  Was given a rx for flonase and robitussin on 4.9- continues on flonase.       Allergies  Allergen Reactions  . Amlodipine Swelling    Peripheral edema  . Lisinopril Cough   Previous Medications   ALBUTEROL (PROVENTIL HFA;VENTOLIN HFA) 108 (90 BASE) MCG/ACT INHALER    Inhale 2 puffs into the lungs every 4 (four) hours as needed for wheezing or shortness of breath.   AZITHROMYCIN (ZITHROMAX) 250 MG TABLET    2 tablets day 1 and then 1 tablet daily until complete   DIAZEPAM (VALIUM) 5 MG TABLET    Take 1 tablet (5 mg total) by mouth every 8 (eight) hours as needed for muscle spasms.   FLUTICASONE (FLONASE) 50 MCG/ACT NASAL SPRAY    Place 2 sprays into both nostrils daily.   GUAIFENESIN (ROBITUSSIN) 100 MG/5ML SOLN    Take 5 mLs (100 mg total) by mouth every 4 (four) hours as needed for cough or to loosen phlegm.   IBUPROFEN (ADVIL,MOTRIN) 600 MG TABLET    Take 1 tablet (600 mg total) by mouth every 8 (eight) hours as needed.   LACTULOSE (CHRONULAC) 10 GM/15ML SOLUTION    Take 15 mLs (10 g total) by mouth 2 (two) times daily as needed for mild constipation.   LOSARTAN (COZAAR) 25 MG TABLET    TAKE ONE TABLET BY MOUTH EVERY DAY   LOSARTAN (COZAAR) 50 MG TABLET    Take 0.5 tablets (25 mg total) by mouth daily.   OMEPRAZOLE (PRILOSEC) 20 MG CAPSULE    Take 1 capsule (20 mg total) by mouth daily.   PREDNISONE (DELTASONE) 10 MG TABLET    Take 4 tablets (40 mg total) by mouth daily.   SODIUM CHLORIDE (OCEAN) 0.65 % SOLN NASAL SPRAY    Place 2 sprays into both nostrils 4 (four) times daily for 5 days.   TIOTROPIUM (SPIRIVA) 18 MCG INHALATION CAPSULE     Place 1 capsule (18 mcg total) into inhaler and inhale daily.   UMECLIDINIUM BROMIDE (INCRUSE ELLIPTA) 62.5 MCG/INH AEPB    Inhale 1 puff into the lungs daily.    Review of Systems  Constitutional: Negative for chills and fever.  HENT: Positive for rhinorrhea, sneezing and sore throat.   Eyes: Positive for itching.  Respiratory: Negative for wheezing.   Gastrointestinal: Negative for diarrhea, nausea and vomiting.    Social History   Tobacco Use  . Smoking status: Current Some Day Smoker    Types: Cigarettes  . Smokeless tobacco: Never Used  . Tobacco comment: occasionally  Substance Use Topics  . Alcohol use: No    Comment: former occassional   Objective:   LMP 02/28/2014 (Approximate)   Physical Exam  No PE.    Assessment & Plan:        Allergic rhinitis: Supportive care.  Continue Flonase- add daily OTC allergy medication.  RTC if symptoms are unrelieved.   FU in 4 months for routine care.    [facility]   [facility]

## 2018-09-09 ENCOUNTER — Ambulatory Visit: Payer: Self-pay

## 2018-09-22 ENCOUNTER — Encounter (INDEPENDENT_AMBULATORY_CARE_PROVIDER_SITE_OTHER): Payer: Self-pay

## 2018-09-22 ENCOUNTER — Ambulatory Visit: Payer: Self-pay | Admitting: Pharmacist

## 2018-09-22 ENCOUNTER — Other Ambulatory Visit: Payer: Self-pay

## 2018-09-22 DIAGNOSIS — Z79899 Other long term (current) drug therapy: Secondary | ICD-10-CM

## 2018-09-22 NOTE — Progress Notes (Unsigned)
Medication Management Clinic Visit Note  Patient: Monique Edwards MRN: [id number] Date of Birth: 1966-01-02 PCP: Gracelyn NurseJohnston, [name], MD   Monique Edwards 53 y.o. female, was contacted today for her 6 month follow up medication review with the pharmacist. Two identifiers were used to verify the correct patient. The majority of the review was completed, however, the patient was not at home at the time and did not have her medications with her. Of note, her husband passed away last week.   LMP 02/28/2014 (Approximate)   Patient Information   Past Medical History:  Diagnosis Date  . COPD (chronic obstructive pulmonary disease) (HCC)   . Fatty liver   . GERD (gastroesophageal reflux disease)   . Hypertension       Past Surgical History:  Procedure Laterality Date  . CHOLECYSTECTOMY    . ESOPHAGOGASTRODUODENOSCOPY (EGD) WITH PROPOFOL N/A 06/06/2018   Procedure: ESOPHAGOGASTRODUODENOSCOPY (EGD) WITH PROPOFOL;  Surgeon: Wyline MoodAnna, Kiran, MD;  [facility];  Service: Gastroenterology;  Laterality: N/A;     Family History  Problem Relation Age of Onset  . Hypertension Mother   . Stroke Mother   . Diabetes Father   . Arthritis Sister   . Thyroid disease Sister   . Hypertension Brother   . Hypertension Brother     New Diagnoses (since last visit):   Family Support: Good            Social History   Substance and Sexual Activity  Alcohol Use No   Comment: former occassional      Social History   Tobacco Use  Smoking Status Current Some Day Smoker  . Types: Cigarettes  Smokeless Tobacco Never Used  Tobacco Comment   occasionally      Health Maintenance  Topic Date Due  . HIV Screening  03/26/1979  . TETANUS/TDAP  03/26/1983  . COLONOSCOPY  03/25/2014  . INFLUENZA VACCINE  12/06/2018  . MAMMOGRAM  01/30/2020  . PAP SMEAR-Modifier  01/29/2021  . Hepatitis C Screening  Completed    Outpatient Encounter Medications as of 09/22/2018  Medication Sig  .  albuterol (VENTOLIN HFA) 108 (90 Base) MCG/ACT inhaler Inhale 2 puffs into the lungs every 6 (six) hours as needed for wheezing or shortness of breath.  . fluticasone (FLONASE) 50 MCG/ACT nasal spray Place 2 sprays into both nostrils daily.  Marland Kitchen. ibuprofen (ADVIL,MOTRIN) 600 MG tablet Take 1 tablet (600 mg total) by mouth every 8 (eight) hours as needed.  . lactulose (CHRONULAC) 10 GM/15ML solution Take 15 mLs (10 g total) by mouth 2 (two) times daily as needed for mild constipation.  Marland Kitchen. losartan (COZAAR) 25 MG tablet TAKE ONE TABLET BY MOUTH EVERY DAY  . omeprazole (PRILOSEC) 20 MG capsule Take 1 capsule (20 mg total) by mouth daily.  Marland Kitchen. umeclidinium bromide (INCRUSE ELLIPTA) 62.5 MCG/INH AEPB Inhale 1 puff into the lungs daily.  . [DISCONTINUED] guaiFENesin (ROBITUSSIN) 100 MG/5ML SOLN Take 5 mLs (100 mg total) by mouth every 4 (four) hours as needed for cough or to loosen phlegm.  . [DISCONTINUED] losartan (COZAAR) 50 MG tablet Take 0.5 tablets (25 mg total) by mouth daily.  . [DISCONTINUED] sodium chloride (OCEAN) 0.65 % SOLN nasal spray Place 2 sprays into both nostrils 4 (four) times daily for 5 days.  . [DISCONTINUED] tiotropium (SPIRIVA) 18 MCG inhalation capsule Place 1 capsule (18 mcg total) into inhaler and inhale daily.   No facility-administered encounter medications on file as of 09/22/2018.     Health Maintenance/Date Completed  Last ED visit: 07/28/18 Last Visit to PCP: 09/04/18 Next Visit to PCP: 01/08/19 Specialist Visit: 08/21/18 Dr. Tobi Bastos Laurette Schimke) Dental Exam:  Eye Exam: 2017 Prostate Exam: NA Pelvic/PAP Exam: 01/29/18 Mammogram: 01/29/18 DEXA:  Colonoscopy: 2017 ([facility]) Flu Vaccine: 2019 Pneumonia Vaccine:   ASSESSMENT  Compliance: Not able to fully assess. Patient was not at home, so she did not have her bottles. Also, she recently lost her husband and admitted to omitting some of her medications.  Chronic Bronchitis: Ventolin HFA, Incruse Ellipta Last seen in the ED  07/28/18 for a cough and congestion. She states that her COPD is not managed well. She continues to have shortness of breath and difficulty walking distances. She uses 2 pillows at night to sleep. She is not using CPAP or Oxygen. She is not currently smoking.  GERD: omeprazole  Fatty Liver: lactulose Followed by Dr. Tobi Bastos - Gatroenterology Patient has not been taking the lactulose twice daily as prescribed. States she "swigs" a dose once daily. She does endorse daily bowel movements. Hepatic Function Tests, WNL (07/28/18) TC = 150 mg/dl; TG = 229 mg/dl; HDL = 54 mg/dl; LDL = 73 mg/dl (79/89/21)  Hypertension: losartan Not able to assess. K+ = 3.9 mmol/L; SCr = 0.73 mg/dl (1/94/17)  Arthritis: ibuprofen States ibuprofen marginally helps. C/o pain in knees and hips. Numbness in knee area has improved only occasional issues. Patient fell down 11 steps years ago and has residual issues.  PLAN Return to clinic in 6 months

## 2018-09-24 ENCOUNTER — Encounter: Payer: Self-pay | Admitting: Pharmacist

## 2018-10-01 ENCOUNTER — Ambulatory Visit: Payer: Self-pay | Admitting: Gerontology

## 2018-10-02 ENCOUNTER — Emergency Department
Admission: EM | Admit: 2018-10-02 | Discharge: 2018-10-03 | Disposition: A | Discharge status: Other Acute Inpatient Hospital | Payer: Self-pay | Attending: Emergency Medicine | Admitting: Emergency Medicine

## 2018-10-02 ENCOUNTER — Emergency Department: Payer: Self-pay

## 2018-10-02 ENCOUNTER — Other Ambulatory Visit: Payer: Self-pay

## 2018-10-02 DIAGNOSIS — R400 Somnolence: Secondary | ICD-10-CM | POA: Insufficient documentation

## 2018-10-02 DIAGNOSIS — Z1159 Encounter for screening for other viral diseases: Secondary | ICD-10-CM | POA: Insufficient documentation

## 2018-10-02 DIAGNOSIS — F332 Major depressive disorder, recurrent severe without psychotic features: Secondary | ICD-10-CM | POA: Diagnosis present

## 2018-10-02 DIAGNOSIS — Z79899 Other long term (current) drug therapy: Secondary | ICD-10-CM | POA: Insufficient documentation

## 2018-10-02 DIAGNOSIS — F17210 Nicotine dependence, cigarettes, uncomplicated: Secondary | ICD-10-CM | POA: Insufficient documentation

## 2018-10-02 DIAGNOSIS — R45851 Suicidal ideations: Secondary | ICD-10-CM | POA: Insufficient documentation

## 2018-10-02 DIAGNOSIS — F1910 Other psychoactive substance abuse, uncomplicated: Secondary | ICD-10-CM | POA: Insufficient documentation

## 2018-10-02 DIAGNOSIS — T1491XA Suicide attempt, initial encounter: Secondary | ICD-10-CM

## 2018-10-02 LAB — COMPREHENSIVE METABOLIC PANEL
ALT: 34 U/L (ref 0–44)
AST: 37 U/L (ref 15–41)
Albumin: 3.9 g/dL (ref 3.5–5.0)
Alkaline Phosphatase: 55 U/L (ref 38–126)
Anion gap: 10 (ref 5–15)
BUN: 10 mg/dL (ref 6–20)
CO2: 24 mmol/L (ref 22–32)
Calcium: 9.1 mg/dL (ref 8.9–10.3)
Chloride: 107 mmol/L (ref 98–111)
Creatinine, Ser: 0.91 mg/dL (ref 0.44–1.00)
GFR calc Af Amer: >60 mL/min (ref >60)
GFR calc non Af Amer: >60 mL/min (ref >60)
Glucose, Bld: 94 mg/dL (ref 70–99)
Potassium: 3.9 mmol/L (ref 3.5–5.1)
Sodium: 141 mmol/L (ref 135–145)
Total Bilirubin: 1.1 mg/dL (ref 0.3–1.2)
Total Protein: 7.7 g/dL (ref 6.5–8.1)

## 2018-10-02 LAB — CBC WITH DIFFERENTIAL/PLATELET
Abs Immature Granulocytes: 0.01 K/uL (ref 0.00–0.07)
Basophils Absolute: 0 K/uL (ref 0.0–0.1)
Basophils Relative: 1 %
Eosinophils Absolute: 0.1 K/uL (ref 0.0–0.5)
Eosinophils Relative: 2 %
HCT: 35.9 % — ABNORMAL LOW (ref 36.0–46.0)
Hemoglobin: 11.6 g/dL — ABNORMAL LOW (ref 12.0–15.0)
Immature Granulocytes: 0 %
Lymphocytes Relative: 41 %
Lymphs Abs: 1.7 K/uL (ref 0.7–4.0)
MCH: 27.8 pg (ref 26.0–34.0)
MCHC: 32.3 g/dL (ref 30.0–36.0)
MCV: 86.1 fL (ref 80.0–100.0)
Monocytes Absolute: 0.4 K/uL (ref 0.1–1.0)
Monocytes Relative: 9 %
Neutro Abs: 1.9 K/uL (ref 1.7–7.7)
Neutrophils Relative %: 47 %
Platelets: 76 K/uL — ABNORMAL LOW (ref 150–400)
RBC: 4.17 MIL/uL (ref 3.87–5.11)
RDW: 14.4 % (ref 11.5–15.5)
WBC: 4.1 K/uL (ref 4.0–10.5)
nRBC: 0 % (ref 0.0–0.2)

## 2018-10-02 LAB — URINE DRUG SCREEN, QUALITATIVE (ARMC ONLY)
Amphetamines, Ur Screen: POSITIVE — AB
Barbiturates, Ur Screen: NOT DETECTED
Benzodiazepine, Ur Scrn: POSITIVE — AB
Cannabinoid 50 Ng, Ur ~~LOC~~: NOT DETECTED
Cocaine Metabolite,Ur ~~LOC~~: POSITIVE — AB
MDMA (Ecstasy)Ur Screen: NOT DETECTED
Methadone Scn, Ur: NOT DETECTED
Opiate, Ur Screen: NOT DETECTED
Phencyclidine (PCP) Ur S: NOT DETECTED
Tricyclic, Ur Screen: NOT DETECTED

## 2018-10-02 LAB — GLUCOSE, CAPILLARY: Glucose-Capillary: 93 mg/dL (ref 70–99)

## 2018-10-02 LAB — URINALYSIS, COMPLETE (UACMP) WITH MICROSCOPIC
Bacteria, UA: NONE SEEN
Bilirubin Urine: NEGATIVE
Glucose, UA: NEGATIVE mg/dL
Ketones, ur: 5 mg/dL — AB
Leukocytes,Ua: NEGATIVE
Nitrite: NEGATIVE
Protein, ur: NEGATIVE mg/dL
Specific Gravity, Urine: 1.009 (ref 1.005–1.030)
pH: 5 (ref 5.0–8.0)

## 2018-10-02 LAB — ETHANOL: Alcohol, Ethyl (B): <10 mg/dL (ref <10)

## 2018-10-02 LAB — LACTIC ACID, PLASMA: Lactic Acid, Venous: 1.2 mmol/L (ref 0.5–1.9)

## 2018-10-02 LAB — SALICYLATE LEVEL: Salicylate Lvl: <7 mg/dL (ref 2.8–30.0)

## 2018-10-02 LAB — SARS CORONAVIRUS 2 BY RT PCR (HOSPITAL ORDER, PERFORMED IN ~~LOC~~ HOSPITAL LAB): SARS Coronavirus 2: NEGATIVE

## 2018-10-02 LAB — ACETAMINOPHEN LEVEL: Acetaminophen (Tylenol), Serum: <10 ug/mL — ABNORMAL LOW (ref 10–30)

## 2018-10-02 LAB — PROTIME-INR
INR: 1.1 (ref 0.8–1.2)
Prothrombin Time: 14 seconds (ref 11.4–15.2)

## 2018-10-02 LAB — AMMONIA: Ammonia: 43 umol/L — ABNORMAL HIGH (ref 9–35)

## 2018-10-02 MED ORDER — NALOXONE HCL 2 MG/2ML IJ SOSY
PREFILLED_SYRINGE | INTRAMUSCULAR | Status: AC
  Administered 2018-10-02: 2 mg via INTRAVENOUS
  Filled 2018-10-02: qty 2

## 2018-10-02 MED ORDER — NALOXONE HCL 2 MG/2ML IJ SOSY
2.0000 mg | PREFILLED_SYRINGE | Freq: Once | INTRAMUSCULAR | Status: AC
  Administered 2018-10-02: 2 mg via INTRAVENOUS
  Filled 2018-10-02: qty 2

## 2018-10-02 MED ORDER — SODIUM CHLORIDE 0.9 % IV BOLUS
2000.0000 mL | Freq: Once | INTRAVENOUS | Status: AC
  Administered 2018-10-02: 2000 mL via INTRAVENOUS

## 2018-10-02 MED ORDER — IBUPROFEN 800 MG PO TABS
800.0000 mg | ORAL_TABLET | Freq: Once | ORAL | Status: AC
  Administered 2018-10-02: 800 mg via ORAL
  Filled 2018-10-02: qty 1

## 2018-10-02 MED ORDER — LACTULOSE 10 GM/15ML PO SOLN
20.0000 g | Freq: Once | ORAL | Status: DC
  Filled 2018-10-02: qty 30

## 2018-10-02 NOTE — ED Notes (Signed)
Pt otf for CT scan

## 2018-10-02 NOTE — ED Notes (Signed)
Pt transferred into ED BHU room 2  Report received from Assunta Gambles RN   Patient assigned to appropriate care area. Patient oriented to unit/care area: Informed that, for their safety, care areas are designed for safety and monitored by security cameras at all times;  phone times explained to patient. Patient verbalizes understanding, and verbal contract for safety obtained.   Assessment completed  Psych consult pending

## 2018-10-02 NOTE — ED Notes (Signed)
Pt extremely sleepy, unable to carry on conversation. States she slept well last night. Unable to say why so sleepy. Pt placed on monitor Vss, nad.

## 2018-10-02 NOTE — ED Notes (Signed)
Pt too sleepy to safely take lactulose po at this time, notified J. Cuthriell PA, ok to wait until pt awakens.

## 2018-10-02 NOTE — ED Notes (Signed)
ED BHU PLACEMENT JUSTIFICATION Is the patient under IVC or is there intent for IVC: voluntary  Is the patient medically cleared: Yes.   Is there vacancy in the ED BHU: Yes.   Is the population mix appropriate for patient: Yes.   Is the patient awaiting placement in inpatient or outpatient setting: Has the patient had a psychiatric consult:  Consult with psychiatrist pending Survey of unit performed for contraband, proper placement and condition of furniture, tampering with fixtures in bathroom, shower, and each patient room: Yes.  ; Findings:  APPEARANCE/BEHAVIOR Calm and cooperative NEURO ASSESSMENT Orientation: oriented x3   Hallucinations: No.None noted (Hallucinations) denies at present Speech: Normal Gait: normal RESPIRATORY ASSESSMENT Even  Unlabored respirations  CARDIOVASCULAR ASSESSMENT Pulses equal   regular rate  Skin warm and dry   GASTROINTESTINAL ASSESSMENT no GI complaint EXTREMITIES Full ROM  PLAN OF CARE Provide calm/safe environment. Vital signs assessed twice daily. ED BHU Assessment once each 12-hour shift. Collaborate with TTS daily or as condition indicates. Assure the ED provider has rounded once each shift. Provide and encourage hygiene. Provide redirection as needed. Assess for escalating behavior; address immediately and inform ED provider.  Assess family dynamic and appropriateness for visitation as needed: Yes.  ; If necessary, describe findings:  Educate the patient/family about BHU procedures/visitation: Yes.  ; If necessary, describe findings:

## 2018-10-02 NOTE — ED Notes (Signed)
Pt assisted to bathroom. Pt with steady gait. Pt A&O x4. Pt asked this RN, why she is here. Post RN explaining the reason for pts stay, pt sts, "I just didn't want to deal with it anymore. I didn't want to be here anymore. I just lost my husband." Pt in tears. Pt reports SI to this RN. MD made aware as well as charge Charity fundraiser.

## 2018-10-02 NOTE — ED Provider Notes (Signed)
[facility] Emergency Department Provider Note  ____________________________________________  Time seen: Approximately 1:53 PM  I have reviewed the triage vital signs and the nursing notes.   HISTORY  Chief Complaint Weakness  Level 5 caveat, history limited as patient is altered mental status/somnolent  HPI Patti Shorb is a 53 y.o. female who presents the emergency department via EMS for complaint of altered mental status.  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  Patient was confused, disoriented, somnolent.  EMS advises that family is concerned for use of illicit substances.  Patient is very somnolent, will awaken and answer and one-word answers.  Patient is largely noncontributory towards her history.  Patient does admit to cocaine use 2 days ago.  She denies any headache, chest pain, abdominal pain, nausea or vomiting.  Denies any URI type symptoms.  Further information is not able to be obtained from the patient at this time.         Past Medical History:  Diagnosis Date  . COPD (chronic obstructive pulmonary disease) (HCC)   . Fatty liver   . GERD (gastroesophageal reflux disease)   . Hypertension     Patient Active Problem List   Diagnosis Date Noted  . COPD exacerbation (HCC) 07/31/2018  . Acute exacerbation of chronic bronchitis (HCC) 03/13/2018  . Fatty liver 03/13/2018  . Tobacco use disorder 03/13/2018  . Post-nasal drip 02/27/2018  . Morbid obesity (HCC) 10/22/2016  . GERD without esophagitis 10/22/2016  . Chronic pain of right knee 10/16/2016  . Paraspinal muscle spasm 10/16/2016  . Hypertension 10/16/2016  . Fall (on) (from) other stairs and steps, subsequent encounter 07/19/2016    Past Surgical History:  Procedure Laterality Date  . CHOLECYSTECTOMY    . ESOPHAGOGASTRODUODENOSCOPY (EGD) WITH PROPOFOL N/A 06/06/2018   Procedure: ESOPHAGOGASTRODUODENOSCOPY (EGD) WITH PROPOFOL;  Surgeon: Wyline Mood, MD;   Location: [facility] ENDOSCOPY;  Service: Gastroenterology;  Laterality: N/A;    Prior to Admission medications   Medication Sig Start Date End Date Taking? Authorizing Provider  albuterol (VENTOLIN HFA) 108 (90 Base) MCG/ACT inhaler Inhale 2 puffs into the lungs every 6 (six) hours as needed for wheezing or shortness of breath.    Tukov-Yual, Magdalene S, NP  fluticasone (FLONASE) 50 MCG/ACT nasal spray Place 2 sprays into both nostrils daily. 08/14/18   Michiel Cowboy A, PA-C  ibuprofen (ADVIL,MOTRIN) 600 MG tablet Take 1 tablet (600 mg total) by mouth every 8 (eight) hours as needed. 05/27/18   Iloabachie, Chioma E, NP  lactulose (CHRONULAC) 10 GM/15ML solution Take 15 mLs (10 g total) by mouth 2 (two) times daily as needed for mild constipation. 05/21/18   Wyline Mood, MD  losartan (COZAAR) 25 MG tablet TAKE ONE TABLET BY MOUTH EVERY DAY 07/14/18   Doles-Johnson, Teah, NP  omeprazole (PRILOSEC) 20 MG capsule Take 1 capsule (20 mg total) by mouth daily. 03/13/18   Tukov-Yual, Alroy Bailiff, NP  umeclidinium bromide (INCRUSE ELLIPTA) 62.5 MCG/INH AEPB Inhale 1 puff into the lungs daily.    [provider]    Allergies Amlodipine and Lisinopril  Family History  Problem Relation Age of Onset  . Hypertension Mother   . Stroke Mother   . Diabetes Father   . Arthritis Sister   . Thyroid disease Sister   . Hypertension Brother   . Hypertension Brother     Social History Social History   Tobacco Use  . Smoking status: Current Some Day Smoker    Types: Cigarettes  .  Smokeless tobacco: Never Used  . Tobacco comment: occasionally  Substance Use Topics  . Alcohol use: No    Comment: former occassional  . Drug use: Yes    Types: Cocaine     Review of Systems  Constitutional: No fever/chills Eyes: No visual changes. ENT: No upper respiratory complaints. Cardiovascular: no chest pain. Respiratory: no cough. No SOB. Gastrointestinal: No abdominal pain.  No nausea, no vomiting.  No  diarrhea.  No constipation. Genitourinary: Negative for dysuria. No hematuria Musculoskeletal: Negative for musculoskeletal pain. Skin: Negative for rash, abrasions, lacerations, ecchymosis. Neurological: Negative for headaches, focal weakness or numbness. 10-point ROS otherwise negative.  ____________________________________________   PHYSICAL EXAM:  VITAL SIGNS: ED Triage Vitals  Enc Vitals Group     BP 10/02/18 1307 132/75     Pulse Rate 10/02/18 1306 73     Resp 10/02/18 1307 17     Temp 10/02/18 1307 98.1 F (36.7 C)     Temp Source 10/02/18 1306 Oral     SpO2 10/02/18 1307 96 %     Weight 10/02/18 1308 260 lb (117.9 kg)     Height 10/02/18 1308 5\' 2"  (1.575 m)     Head Circumference --      Peak Flow --      Pain Score 10/02/18 1308 6     Pain Loc --      Pain Edu? --      Excl. in GC? --      Constitutional: Alert and oriented. Well appearing and in no acute distress. Eyes: Conjunctivae are constricted, sluggish to respond.Marland Kitchen PERRL. EOMI. Head: Atraumatic. ENT:      Ears:       Nose: No congestion/rhinnorhea.      Mouth/Throat: Mucous membranes are moist.  Neck: No stridor.  No cervical spine tenderness to palpation.  Cardiovascular: Normal rate, regular rhythm. Normal S1 and S2.  Good peripheral circulation. Respiratory: Normal respiratory effort without tachypnea or retractions. Lungs CTAB. Good air entry to the bases with no decreased or absent breath sounds. Gastrointestinal: Bowel sounds 4 quadrants. Soft and nontender to palpation. No guarding or rigidity. No palpable masses. No distention. No CVA tenderness. Musculoskeletal: Full range of motion to all extremities. No gross deformities appreciated. Neurologic:  . No gross focal neurologic deficits are appreciated.  Attempted cranial nerve testing.  Patient is very somnolent, would not follow along with cranial nerve testing. Skin:  Skin is warm, dry and intact. No rash noted. Psychiatric: Mood and affect  are depressed.  On awakening, patient admits to suicide attempt with medication due to losing her husband recently.  Patient with no homicidal ideation.   ____________________________________________   LABS (all labs ordered are listed, but only abnormal results are displayed)  Labs Reviewed  CBC WITH DIFFERENTIAL/PLATELET - Abnormal; Notable for the following components:      Result Value   Hemoglobin 11.6 (*)    HCT 35.9 (*)    Platelets 76 (*)    All other components within normal limits  AMMONIA - Abnormal; Notable for the following components:   Ammonia 43 (*)    All other components within normal limits  URINALYSIS, COMPLETE (UACMP) WITH MICROSCOPIC - Abnormal; Notable for the following components:   Color, Urine YELLOW (*)    APPearance CLEAR (*)    Hgb urine dipstick SMALL (*)    Ketones, ur 5 (*)    All other components within normal limits  URINE DRUG SCREEN, QUALITATIVE ([facility] ONLY) - Abnormal; Notable for  the following components:   Amphetamines, Ur Screen POSITIVE (*)    Cocaine Metabolite,Ur [place] POSITIVE (*)    Benzodiazepine, Ur Scrn POSITIVE (*)    All other components within normal limits  SARS CORONAVIRUS 2 (HOSPITAL ORDER, PERFORMED IN [facility])  COMPREHENSIVE METABOLIC PANEL  LACTIC ACID, PLASMA  PROTIME-INR  GLUCOSE, CAPILLARY  ETHANOL  SALICYLATE LEVEL  ACETAMINOPHEN LEVEL  CBG MONITORING, ED  CBG MONITORING, ED   ____________________________________________  EKG   ____________________________________________  RADIOLOGY I personally viewed and evaluated these images as part of my medical decision making, as well as reviewing the written report by the radiologist.  Ct Head Wo Contrast  Result Date: 10/02/2018 CLINICAL DATA:  Altered level of consciousness. EXAM: CT HEAD WITHOUT CONTRAST TECHNIQUE: Contiguous axial images were obtained from the base of the skull through the vertex without intravenous contrast. COMPARISON:  None.  FINDINGS: Brain: No evidence of acute infarction, hemorrhage, hydrocephalus, extra-axial collection or mass lesion/mass effect. Vascular: No hyperdense vessel or unexpected calcification. Skull: Normal. Negative for fracture or focal lesion. Sinuses/Orbits: Small amount of fluid is noted in right maxillary sinus. Other: None. IMPRESSION: No acute intracranial abnormality seen. Electronically Signed   By: Lupita Raider M.D.   On: 10/02/2018 15:46    ____________________________________________    PROCEDURES  Procedure(s) performed:    Procedures    Medications  lactulose (CHRONULAC) 10 GM/15ML solution 20 g (has no administration in time range)  naloxone [facility]) injection 2 mg (2 mg Intravenous Given 10/02/18 1406)  sodium chloride 0.9 % bolus 2,000 mL (0 mLs Intravenous Stopped 10/02/18 1925)     ____________________________________________   INITIAL IMPRESSION / ASSESSMENT AND PLAN / ED COURSE  Pertinent labs & imaging results that were available during my care of the patient were reviewed by me and considered in my medical decision making (see chart for details).  Review of the [place] CSRS was performed in accordance of the [facility] prior to dispensing any controlled drugs.  Clinical Course as of Oct 02 1934  Thu Oct 02, 2018  1713 Presented to the emergency department via EMS for complaint of altered mental status/somnolence.  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  Patient is very somnolent, she is arousable but answers in one-word answers to questions.  Apparently, patient has no complaints other than sleepiness.  She denied any headache, neck pain, chest pain, abdominal pain.  She did admit to cocaine use 2 days ago but denies any current or recent use.  No other medications or illicit substances other than cocaine are admitted to.  Patient arrived very somnolent, constricted pupils that were slow to respond.  Given altered mental status, findings, concern for  illicit substances patient was given Narcan.  Patient did not have any significant response to Narcan.  Labs are otherwise reassuring with the exception of being positive for amphetamines, cocaine, benzos on urine drug screen.  Patient also has a slightly elevated ammonia level.  She does have a history of hep C that was successfully treated with no residual RNA load.  Liver labs are otherwise reassuring.  At this time I feel that patient symptoms are most likely attributed to polysubstance abuse.  Patient is given fluids.  Patient will be given a dose of lactulose for elevated ammonia level as well.   [JC]  1930 Patient presented to the emergency department altered mental status.  On exam, patient did have constricted pupils, very somnolent.  Patient was given Narcan  with no improvement.  Labs returned positive for amphetamine, cocaine, benzos as well as a slightly elevated ammonia level.  When patient finally became more cognitive, patient admits to taking these medications in an effort to commit suicide.  Patient reports that she recently lost her husband, has no will to live and wanted to kill herself.  Patient verbalizes that she got her hands on any kind of illicit substances that she could to take to kill herself.  At this time, patient will be transferred to the behavioral section of the emergency department and placed under IVC.  Patient care is turned over to attending provider, Dr. Cyril LoosenKinner.   [JC]    Clinical Course User Index [JC] Cuthriell, Delorise RoyalsJonathan D, PA-C          Patient presented to the emergency department via EMS for altered mental status.  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  Initially, patient did have constricted pupils, extreme somnolence.  Narcan was administered with no effect.  Patient's labs returned positive for cocaine, amphetamines, benzos.  As patient returned more to normal mentation, patient admits to attempting suicide by medication.   Patient reports that she recently lost her husband, attempted to commit suicide by using multiple illicit substances.  At this time, patient will be involuntary committed, evaluated by psychiatry.   ____________________________________________  FINAL CLINICAL IMPRESSION(S) / ED DIAGNOSES  Final diagnoses:  Polysubstance abuse (HCC)  Somnolence  Suicide attempt (HCC)      NEW MEDICATIONS STARTED DURING THIS VISIT:  ED Discharge Orders    None          This chart was dictated using voice recognition software/Dragon. Despite best efforts to proofread, errors can occur which can change the meaning. Any change was purely unintentional.    Racheal PatchesCuthriell, Jonathan D, PA-C 10/02/18 1936    Phineas SemenGoodman, Graydon, MD 10/02/18 2013

## 2018-10-02 NOTE — ED Notes (Addendum)
Pt still asleep, hard to keep awake, see vs, fluid infusing

## 2018-10-02 NOTE — ED Notes (Signed)
Spoke with lab who said they will add on new labs to existing blood.

## 2018-10-02 NOTE — ED Provider Notes (Addendum)
-----------------------------------------   3:21 PM on 10/02/2018 -----------------------------------------  I have personally seen and evaluated the patient.  She presents for altered mental status.  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  Here the patient is confused, is somewhat somnolent but awakens to voice answers questions appropriately however is disoriented to time.  Patient does admit to cocaine use 2 days ago but denies anything today.  Patient denies any alcohol use today.  Given a history of substance abuse and altered mental status patient was given Narcan upon arrival with minimal response.  Patient is somnolent but is awake and talking, answering most questions appears to be appropriate overall.  We will maintain on a cardiac monitor, we will check labs and continue to closely monitor.  Differential is quite broad but would include substance use, infectious etiology, metabolic abnormality.  Initial lab work is largely within normal limits, normal CBC and CMP.  Mild elevation in ammonia level at 43.  Urinalysis is normal.  Urine drug screen is positive for amphetamines benzodiazepines and cocaine.  Patient's current presentation could very likely be benzodiazepine induced.  We will continue to closely monitor while the patient metabolizes medications and we await the remainder the patient's work-up.   Minna Antis, MD 10/02/18 1523  EKG viewed and interpreted by myself shows sinus bradycardia 56 bpm with a narrow QRS, normal axis, prolonged PR interval 222 ms consistent first-degree AV block otherwise normal intervals with no concerning ST changes.    Minna Antis, MD 10/02/18 1524

## 2018-10-02 NOTE — ED Triage Notes (Signed)
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 EMS states family reports pt has a hx of substance abuse, pt denies using any drugs today. Pt is constantly falling asleep in triage but responds to verbal stimuli, pt is disoriented to date, oriented to person, place

## 2018-10-03 ENCOUNTER — Other Ambulatory Visit: Payer: Self-pay

## 2018-10-03 ENCOUNTER — Inpatient Hospital Stay
Admission: AD | Admit: 2018-10-03 | Discharge: 2018-10-05 | DRG: 885 | Disposition: A | Discharge status: Home / Self Care | Payer: No Typology Code available for payment source | Source: Intra-hospital | Attending: Psychiatry | Admitting: Psychiatry

## 2018-10-03 ENCOUNTER — Encounter: Payer: Self-pay | Admitting: Behavioral Health

## 2018-10-03 DIAGNOSIS — Z1159 Encounter for screening for other viral diseases: Secondary | ICD-10-CM

## 2018-10-03 DIAGNOSIS — R4182 Altered mental status, unspecified: Secondary | ICD-10-CM | POA: Diagnosis present

## 2018-10-03 DIAGNOSIS — F332 Major depressive disorder, recurrent severe without psychotic features: Secondary | ICD-10-CM

## 2018-10-03 DIAGNOSIS — I10 Essential (primary) hypertension: Secondary | ICD-10-CM | POA: Diagnosis present

## 2018-10-03 DIAGNOSIS — F322 Major depressive disorder, single episode, severe without psychotic features: Secondary | ICD-10-CM | POA: Diagnosis present

## 2018-10-03 DIAGNOSIS — T424X1A Poisoning by benzodiazepines, accidental (unintentional), initial encounter: Secondary | ICD-10-CM

## 2018-10-03 DIAGNOSIS — K7460 Unspecified cirrhosis of liver: Secondary | ICD-10-CM | POA: Diagnosis present

## 2018-10-03 DIAGNOSIS — J449 Chronic obstructive pulmonary disease, unspecified: Secondary | ICD-10-CM | POA: Diagnosis present

## 2018-10-03 DIAGNOSIS — K219 Gastro-esophageal reflux disease without esophagitis: Secondary | ICD-10-CM | POA: Diagnosis present

## 2018-10-03 DIAGNOSIS — F17210 Nicotine dependence, cigarettes, uncomplicated: Secondary | ICD-10-CM | POA: Diagnosis present

## 2018-10-03 DIAGNOSIS — F1010 Alcohol abuse, uncomplicated: Secondary | ICD-10-CM

## 2018-10-03 MED ORDER — LOSARTAN POTASSIUM 25 MG PO TABS
25.0000 mg | ORAL_TABLET | Freq: Every day | ORAL | Status: DC
  Administered 2018-10-03: 25 mg via ORAL
  Filled 2018-10-03: qty 1

## 2018-10-03 MED ORDER — TRAZODONE HCL 100 MG PO TABS
100.0000 mg | ORAL_TABLET | Freq: Every day | ORAL | Status: DC
  Administered 2018-10-03 – 2018-10-04 (×2): 100 mg via ORAL
  Filled 2018-10-03 (×2): qty 1

## 2018-10-03 MED ORDER — ACETAMINOPHEN 325 MG PO TABS: 650.0000 mg | ORAL_TABLET | Freq: Four times a day (QID) | ORAL | Status: DC | PRN

## 2018-10-03 MED ORDER — LACTULOSE 10 GM/15ML PO SOLN
20.0000 g | Freq: Every day | ORAL | Status: DC
  Administered 2018-10-03 – 2018-10-05 (×3): 20 g via ORAL
  Filled 2018-10-03 (×3): qty 30

## 2018-10-03 MED ORDER — FLUTICASONE PROPIONATE 50 MCG/ACT NA SUSP
2.0000 | Freq: Every day | NASAL | Status: DC
  Administered 2018-10-03: 2 via NASAL
  Filled 2018-10-03: qty 16

## 2018-10-03 MED ORDER — ALBUTEROL SULFATE HFA 108 (90 BASE) MCG/ACT IN AERS
2.0000 | INHALATION_SPRAY | Freq: Four times a day (QID) | RESPIRATORY_TRACT | Status: DC | PRN
  Filled 2018-10-03 (×2): qty 6.7

## 2018-10-03 MED ORDER — HYDROXYZINE HCL 25 MG PO TABS
25.0000 mg | ORAL_TABLET | Freq: Four times a day (QID) | ORAL | Status: DC | PRN
  Administered 2018-10-03 – 2018-10-05 (×4): 25 mg via ORAL
  Filled 2018-10-03 (×4): qty 1

## 2018-10-03 MED ORDER — IBUPROFEN 600 MG PO TABS
600.0000 mg | ORAL_TABLET | Freq: Once | ORAL | Status: AC
  Administered 2018-10-03: 600 mg via ORAL
  Filled 2018-10-03: qty 1

## 2018-10-03 MED ORDER — ALBUTEROL SULFATE HFA 108 (90 BASE) MCG/ACT IN AERS
2.0000 | INHALATION_SPRAY | Freq: Four times a day (QID) | RESPIRATORY_TRACT | Status: DC | PRN
  Filled 2018-10-03: qty 6.7

## 2018-10-03 MED ORDER — IBUPROFEN 600 MG PO TABS
800.0000 mg | ORAL_TABLET | Freq: Once | ORAL | Status: AC
  Administered 2018-10-03: 800 mg via ORAL
  Filled 2018-10-03: qty 1

## 2018-10-03 MED ORDER — ALUM & MAG HYDROXIDE-SIMETH 200-200-20 MG/5ML PO SUSP: 30.0000 mL | ORAL | Status: DC | PRN

## 2018-10-03 MED ORDER — PANTOPRAZOLE SODIUM 40 MG PO TBEC
40.0000 mg | DELAYED_RELEASE_TABLET | Freq: Every day | ORAL | Status: DC
  Administered 2018-10-03: 40 mg via ORAL
  Filled 2018-10-03: qty 1

## 2018-10-03 MED ORDER — LACTULOSE 10 GM/15ML PO SOLN
10.0000 g | Freq: Two times a day (BID) | ORAL | Status: DC | PRN
  Filled 2018-10-03: qty 30

## 2018-10-03 MED ORDER — MAGNESIUM HYDROXIDE 400 MG/5ML PO SUSP: 30.0000 mL | Freq: Every day | ORAL | Status: DC | PRN

## 2018-10-03 NOTE — ED Provider Notes (Signed)
-----------------------------------------   6:19 AM on 10/03/2018 -----------------------------------------   Blood pressure 138/66, pulse 69, temperature 98.1 F (36.7 C), resp. rate 17, height 5\' 2"  (1.575 m), weight 117.9 kg, last menstrual period 02/28/2014, SpO2 98 %.  The patient is calm and cooperative at this time.  There have been no acute events since the last update.  Awaiting disposition plan from Behavioral Medicine team.   Irean Hong, MD 10/03/18 [phone]

## 2018-10-03 NOTE — Tx Team (Signed)
Initial Treatment Plan 10/03/2018 5:57 PM Shonterria Broxson RZN:[id number]    PATIENT STRESSORS: Loss of boyfriend Medication change or noncompliance Substance abuse   PATIENT STRENGTHS: Ability for insight Communication skills Motivation for treatment/growth   PATIENT IDENTIFIED PROBLEMS: Ineffective coping  grief                   DISCHARGE CRITERIA:  Ability to meet basic life and health needs Improved stabilization in mood, thinking, and/or behavior Motivation to continue treatment in a less acute level of care  PRELIMINARY DISCHARGE PLAN: Outpatient therapy Return to previous living arrangement Return to previous work or school arrangements  PATIENT/FAMILY INVOLVEMENT: This treatment plan has been presented to and reviewed with the patient, Malikia Bergs, and/or family member.  The patient and family have been given the opportunity to ask questions and make suggestions.  Leamon Arnt, RN 10/03/2018, 5:57 PM

## 2018-10-03 NOTE — BH Assessment (Addendum)
Patient is to be admitted to [facility] BMU by Dr. Gillermo Murdoch.   Attending Physician will be Dr. Toni Amend.   Patient has been assigned to room 315, by [facility] Charge Nurse Shatara.   Intake Paper Work has been signed and placed on patient chart.  ER staff is aware of the admission:  Misty Stanley, ER Secretary  Dr. Don Perking, ER MD  Prudencio Burly, Patient's Nurse  Donella Stade., Patient Access  Patient can transfer to [facility] BMU when bed because available. Waiting on discharge.

## 2018-10-03 NOTE — BH Assessment (Signed)
Assessment Note  Monique Edwards is an 53 y.o. female who presents to the ED with c/o worsening depression since her boyfriend of 15 years passed away recently. 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 EMS states family reports pt has a hx of substance abuse, pt denies using any drugs today.  Pt reports that she is having thoughts of suicide "off and on" more so because it's hard for her to live without her boyfriend. When asked if she used any drugs patient was sure that she doesn't use however after closer inspection of pt's chart she does in fact struggle with polysubstance use.    During the assessment pt was so groggy she couldn't keep her eyes open Pt was seen by NP, Mikki Santee and meets criteria for inpatient treatment.   Diagnosis:Depress Past Medical History:  Past Medical History:  Diagnosis Date  . COPD (chronic obstructive pulmonary disease) (HCC)   . Fatty liver   . GERD (gastroesophageal reflux disease)   . Hypertension     Past Surgical History:  Procedure Laterality Date  . CHOLECYSTECTOMY    . ESOPHAGOGASTRODUODENOSCOPY (EGD) WITH PROPOFOL N/A 06/06/2018   Procedure: ESOPHAGOGASTRODUODENOSCOPY (EGD) WITH PROPOFOL;  Surgeon: Wyline Mood, MD;  Location: [facility] ENDOSCOPY;  Service: Gastroenterology;  Laterality: N/A;    Family History:  Family History  Problem Relation Age of Onset  . Hypertension Mother   . Stroke Mother   . Diabetes Father   . Arthritis Sister   . Thyroid disease Sister   . Hypertension Brother   . Hypertension Brother     Social History:  reports that she has been smoking cigarettes. She has never used smokeless tobacco. She reports current drug use. Drug: Cocaine. She reports that she does not drink alcohol.  Additional Social History:  Alcohol / Drug Use Pain Medications: SEE  MAR Prescriptions: SEE MAR Over the Counter: SEE MAR History of alcohol / drug use?: Yes  CIWA: CIWA-Ar BP: 138/66 Pulse Rate: 69 COWS:    Allergies:  Allergies  Allergen Reactions  . Amlodipine Swelling    Peripheral edema  . Lisinopril Cough    Home Medications: (Not in a hospital admission)   OB/GYN Status:  Patient's last menstrual period was 02/28/2014 (approximate).  General Assessment Data Location of Assessment: [facility] ED TTS Assessment: In system Is this a Tele or Face-to-Face Assessment?: Face-to-Face Is this an Initial Assessment or a Re-assessment for this encounter?: Initial Assessment Patient Accompanied by:: N/A Language Other than English: No Living Arrangements: Other (Comment) What gender do you identify as?: Female Marital status: Single Pregnancy Status: No Living Arrangements: Children Can pt return to current living arrangement?: Yes Admission Status: Voluntary Petitioner: Other Is patient capable of signing voluntary admission?: Yes Referral Source: Self/Family/Friend Insurance type: none  Medical Screening Exam [facility] Walk-in ONLY) Medical Exam completed: Yes  Crisis Care Plan Living Arrangements: Children Legal Guardian: Other:(Self) Name of Psychiatrist: n/a Name of Therapist: n/a  Education Status Is patient currently in school?: No Is the patient employed, unemployed or receiving disability?: Unemployed  Risk to self with the past 6 months Suicidal Ideation: Yes-Currently Present Has patient been a risk to self within the past 6 months prior to admission? : Yes Suicidal Intent: Yes-Currently Present Has patient had any suicidal intent within the past 6 months prior  to admission? : Yes Is patient at risk for suicide?: Yes Suicidal Plan?: Yes-Currently Present Has patient had any suicidal plan within the past 6 months prior to admission? : Yes Specify Current Suicidal Plan: OD on medications Access to Means: Yes Specify Access to  Suicidal Means: At home medications What has been your use of drugs/alcohol within the last 12 months?: current user Previous Attempts/Gestures: No How many times?: 0 Other Self Harm Risks: n/a Triggers for Past Attempts: Unpredictable Intentional Self Injurious Behavior: None Family Suicide History: No Recent stressful life event(s): Loss (Comment) Persecutory voices/beliefs?: No Depression: Yes Depression Symptoms: Tearfulness, Isolating, Loss of interest in usual pleasures, Feeling worthless/self pity, Feeling angry/irritable Substance abuse history and/or treatment for substance abuse?: No Suicide prevention information given to non-admitted patients: Not applicable  Risk to Others within the past 6 months Homicidal Ideation: No Does patient have any lifetime risk of violence toward others beyond the six months prior to admission? : No Thoughts of Harm to Others: No Current Homicidal Intent: No Current Homicidal Plan: No Access to Homicidal Means: No History of harm to others?: No Assessment of Violence: None Noted Violent Behavior Description: none noted Does patient have access to weapons?: No Criminal Charges Pending?: No Does patient have a court date: No Is patient on probation?: No  Psychosis Hallucinations: None noted Delusions: None noted  Mental Status Report Appearance/Hygiene: Disheveled Eye Contact: Poor Motor Activity: Freedom of movement Speech: Slurred Level of Consciousness: Sleeping, Drowsy Mood: Depressed Affect: Appropriate to circumstance Anxiety Level: None Thought Processes: Thought Blocking Judgement: Impaired Orientation: Person, Place, Time, Situation, Appropriate for developmental age Obsessive Compulsive Thoughts/Behaviors: None  Cognitive Functioning Concentration: Decreased Memory: Unable to Assess Is patient IDD: No Insight: Poor Impulse Control: Poor Appetite: Poor Have you had any weight changes? : No Change Sleep:  Decreased Total Hours of Sleep: 6 Vegetative Symptoms: Staying in bed  ADLScreening [facility] Assessment Services) Patient's cognitive ability adequate to safely complete daily activities?: Yes Patient able to express need for assistance with ADLs?: Yes Independently performs ADLs?: Yes (appropriate for developmental age)  Prior Inpatient Therapy Prior Inpatient Therapy: No  Prior Outpatient Therapy Prior Outpatient Therapy: No Does patient have an ACCT team?: No Does patient have Intensive In-House Services?  : No Does patient have [facility] services? : No Does patient have P4CC services?: No  ADL Screening (condition at time of admission) Patient's cognitive ability adequate to safely complete daily activities?: Yes Is the patient deaf or have difficulty hearing?: No Does the patient have difficulty seeing, even when wearing glasses/contacts?: No Does the patient have difficulty concentrating, remembering, or making decisions?: No Patient able to express need for assistance with ADLs?: Yes Does the patient have difficulty dressing or bathing?: No Independently performs ADLs?: Yes (appropriate for developmental age) Does the patient have difficulty walking or climbing stairs?: No Weakness of Legs: None Weakness of Arms/Hands: None  Home Assistive Devices/Equipment Home Assistive Devices/Equipment: None  Therapy Consults (therapy consults require a physician order) PT Evaluation Needed: No OT Evalulation Needed: No SLP Evaluation Needed: No Abuse/Neglect Assessment (Assessment to be complete while patient is alone) Abuse/Neglect Assessment Can Be Completed: Yes Physical Abuse: Denies Verbal Abuse: Denies Sexual Abuse: Denies Exploitation of patient/patient's resources: Denies Self-Neglect: Denies Values / Beliefs Cultural Requests During Hospitalization: None Spiritual Requests During Hospitalization: None Consults Spiritual Care Consult Needed: No Social Work Consult  Needed: No Merchant navy officerAdvance Directives (For Healthcare) Does Patient Have a Medical Advance Directive?: No Would patient like information on creating a  medical advance directive?: No - Patient declined          Disposition:  Disposition Initial Assessment Completed for this Encounter: Yes Disposition of Patient: Admit Type of inpatient treatment program: Adult Patient refused recommended treatment: No Mode of transportation if patient is discharged/movement?: Car  On Site Evaluation by:   Reviewed with Physician:    Heli Dino D Aviah Sorci 10/03/2018 3:23 AM

## 2018-10-03 NOTE — ED Notes (Signed)
Gave update to pt's daughter Gloriann Loan after verbal permission from pt. Told her we are working on a plan and that pt will call her. Created a password for daughter with pt's permission. Password is "[email or web address]" and is documented in epic.

## 2018-10-03 NOTE — Consult Note (Signed)
[facility] Face-to-Face Psychiatry Consult   Reason for Consult: Suicidal ideation Referring Physician: Dr. Lenard Lance Patient Identification: Monique Edwards MRN:  [id number] Principal Diagnosis: <principal problem not specified> Diagnosis:  Active Problems:   MDD (major depressive disorder), recurrent severe, without psychosis (HCC)   Total Time spent with patient: 1 hour  Subjective: "I recently lost my boyfriend of 14 years and I am very depressed.  I have been having thoughts of suicide but with out a plan." Monique Edwards is a 53 y.o. female patient admitted with presented to [facility] ED via EMS.  The patient stated that she was not trying to end her life, but took 6 Xanax to rest because she was feeling sad.  The patient states "I recently lost my boyfriend of 14 years and I am having a difficult time dealing with his death."  This is a patient who is also positive for amphetamines, benzodiazepine, and cocaine.    The patient was seen face-to-face by this provider; chart reviewed and consulted with Dr. Lenard Lance on 10/02/2018 due to the care of the patient. It was discussed with the provider that the patient does meet criteria to be admitted to the inpatient unit.  On evaluation the patient is alert and oriented x 2-3, calm, cooperative, and mood-congruent with affect. The patient does not appear to be responding to internal or external stimuli. Neither is the patient presenting with any delusional thinking. The patient denies auditory or visual hallucinations. The patient voiced that she is having passive suicidal thoughts, but denies homicidal ideations. The patient is not presenting with any psychotic or paranoid behaviors. During an encounter with the patient, she was able to answer questions appropriately with this provider saying her name a few times to get her to focus.  Plan: The patient is a safety risk to self and does requires psychiatric inpatient admission for stabilization and treatment.   HPI:  Per Dr. Lenard Lance; I have personally seen and evaluated the patient.  She presents for altered mental status.  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  Here the patient is confused, is somewhat somnolent but awakens to voice answers questions appropriately however is disoriented to time.  Patient does admit to cocaine use 2 days ago but denies anything today.  Patient denies any alcohol use today.  Given a history of substance abuse and altered mental status patient was given Narcan upon arrival with minimal response.  Patient is somnolent but is awake and talking, answering most questions appears to be appropriate overall.  We will maintain on a cardiac monitor, we will check labs and continue to closely monitor.  Differential is quite broad but would include substance use, infectious etiology, metabolic abnormality.  Initial lab work is largely within normal limits, normal CBC and CMP.  Mild elevation in ammonia level at 43.  Urinalysis is normal.  Urine drug screen is positive for amphetamines benzodiazepines and cocaine.  Patient's current presentation could very likely be benzodiazepine induced.  We will continue to closely monitor while the patient metabolizes medications and we await the remainder the patient's work-up.  Past Psychiatric History:   Risk to Self: Suicidal Ideation: Yes-Currently Present Suicidal Intent: Yes-Currently Present Is patient at risk for suicide?: Yes Suicidal Plan?: Yes-Currently Present Specify Current Suicidal Plan: OD on medications Access to Means: Yes Specify Access to Suicidal Means: At home medications What has been your use of drugs/alcohol within the last 12 months?: current user How  many times?: 0 Other Self Harm Risks: n/a Triggers for Past Attempts: Unpredictable Intentional Self Injurious Behavior: None Risk to Others: Homicidal Ideation: No Thoughts of Harm to Others:  No Current Homicidal Intent: No Current Homicidal Plan: No Access to Homicidal Means: No History of harm to others?: No Assessment of Violence: None Noted Violent Behavior Description: none noted Does patient have access to weapons?: No Criminal Charges Pending?: No Does patient have a court date: No Prior Inpatient Therapy: Prior Inpatient Therapy: No Prior Outpatient Therapy: Prior Outpatient Therapy: No Does patient have an ACCT team?: No Does patient have Intensive In-House Services?  : No Does patient have [facility] services? : No Does patient have P4CC services?: No  Past Medical History:  Past Medical History:  Diagnosis Date  . COPD (chronic obstructive pulmonary disease) (HCC)   . Fatty liver   . GERD (gastroesophageal reflux disease)   . Hypertension     Past Surgical History:  Procedure Laterality Date  . CHOLECYSTECTOMY    . ESOPHAGOGASTRODUODENOSCOPY (EGD) WITH PROPOFOL N/A 06/06/2018   Procedure: ESOPHAGOGASTRODUODENOSCOPY (EGD) WITH PROPOFOL;  Surgeon: Wyline Mood, MD;  Location: [facility] ENDOSCOPY;  Service: Gastroenterology;  Laterality: N/A;   Family History:  Family History  Problem Relation Age of Onset  . Hypertension Mother   . Stroke Mother   . Diabetes Father   . Arthritis Sister   . Thyroid disease Sister   . Hypertension Brother   . Hypertension Brother    Family Psychiatric  History:  Social History:  Social History   Substance and Sexual Activity  Alcohol Use No   Comment: former occassional     Social History   Substance and Sexual Activity  Drug Use Yes  . Types: Cocaine    Social History   Socioeconomic History  . Marital status: Widowed    Spouse name: Not on file  . Number of children: Not on file  . Years of education: Not on file  . Highest education level: Not on file  Occupational History  . Not on file  Social Needs  . Financial resource strain: Not on file  . Food insecurity:    Worry: Not on file    Inability: Not  on file  . Transportation needs:    Medical: Not on file    Non-medical: Not on file  Tobacco Use  . Smoking status: Current Some Day Smoker    Types: Cigarettes  . Smokeless tobacco: Never Used  . Tobacco comment: occasionally  Substance and Sexual Activity  . Alcohol use: No    Comment: former occassional  . Drug use: Yes    Types: Cocaine  . Sexual activity: Not on file  Lifestyle  . Physical activity:    Days per week: Not on file    Minutes per session: Not on file  . Stress: Not on file  Relationships  . Social connections:    Talks on phone: Not on file    Gets together: Not on file    Attends religious service: Not on file    Active member of club or organization: Not on file    Attends meetings of clubs or organizations: Not on file    Relationship status: Not on file  Other Topics Concern  . Not on file  Social History Narrative  . Not on file   Additional Social History:    Allergies:   Allergies  Allergen Reactions  . Amlodipine Swelling    Peripheral edema  . Lisinopril Cough  Labs:  Results for orders placed or performed during the hospital encounter of 10/02/18 (from the past 48 hour(s))  Glucose, capillary     Status: None   Collection Time: 10/02/18  2:23 PM  Result Value Ref Range   Glucose-Capillary 93 70 - 99 mg/dL  CBC with Differential     Status: Abnormal   Collection Time: 10/02/18  2:25 PM  Result Value Ref Range   WBC 4.1 4.0 - 10.5 K/uL   RBC 4.17 3.87 - 5.11 MIL/uL   Hemoglobin 11.6 (L) 12.0 - 15.0 g/dL   HCT 16.135.9 (L) 09.636.0 - 04.546.0 %   MCV 86.1 80.0 - 100.0 fL   MCH 27.8 26.0 - 34.0 pg   MCHC 32.3 30.0 - 36.0 g/dL   RDW 40.914.4 81.111.5 - 91.415.5 %   Platelets 76 (L) 150 - 400 K/uL    Comment: Immature Platelet Fraction may be clinically indicated, consider ordering this additional test [id number]    nRBC 0.0 0.0 - 0.2 %   Neutrophils Relative % 47 %   Neutro Abs 1.9 1.7 - 7.7 K/uL   Lymphocytes Relative 41 %   Lymphs Abs 1.7 0.7  - 4.0 K/uL   Monocytes Relative 9 %   Monocytes Absolute 0.4 0.1 - 1.0 K/uL   Eosinophils Relative 2 %   Eosinophils Absolute 0.1 0.0 - 0.5 K/uL   Basophils Relative 1 %   Basophils Absolute 0.0 0.0 - 0.1 K/uL   Immature Granulocytes 0 %   Abs Immature Granulocytes 0.01 0.00 - 0.07 K/uL    Comment: Performed at [facility], [address] [place], [place] [address]  Comprehensive metabolic panel     Status: None   Collection Time: 10/02/18  2:25 PM  Result Value Ref Range   Sodium 141 135 - 145 mmol/L   Potassium 3.9 3.5 - 5.1 mmol/L    Comment: HEMOLYSIS AT THIS LEVEL MAY AFFECT RESULT   Chloride 107 98 - 111 mmol/L   CO2 24 22 - 32 mmol/L   Glucose, Bld 94 70 - 99 mg/dL   BUN 10 6 - 20 mg/dL   Creatinine, Ser 7.840.91 0.44 - 1.00 mg/dL   Calcium 9.1 8.9 - 69.610.3 mg/dL   Total Protein 7.7 6.5 - 8.1 g/dL   Albumin 3.9 3.5 - 5.0 g/dL   AST 37 15 - 41 U/L   ALT 34 0 - 44 U/L   Alkaline Phosphatase 55 38 - 126 U/L   Total Bilirubin 1.1 0.3 - 1.2 mg/dL   GFR calc non Af Amer >60 >60 mL/min   GFR calc Af Amer >60 >60 mL/min   Anion gap 10 5 - 15    Comment: Performed at [facility], [address] [place], [place] [address]  Ammonia     Status: Abnormal   Collection Time: 10/02/18  2:25 PM  Result Value Ref Range   Ammonia 43 (H) 9 - 35 umol/L    Comment: HEMOLYSIS AT THIS LEVEL MAY AFFECT RESULT Performed at [facility], [address] [place], [place] [address]   Lactic acid, plasma     Status: None   Collection Time: 10/02/18  2:25 PM  Result Value Ref Range   Lactic Acid, Venous 1.2 0.5 - 1.9 mmol/L    Comment: Performed at [facility], [address] [place], [place] [address]  Protime-INR     Status: None   Collection Time: 10/02/18  2:25 PM  Result Value Ref Range  Prothrombin Time 14.0 11.4 - 15.2 seconds   INR 1.1 0.8 - 1.2    Comment: (NOTE) INR goal varies based on device and disease states. Performed at [facility], [address] [place], [place] [address]   Urinalysis, Complete w Microscopic     Status: Abnormal   Collection Time: 10/02/18  2:25 PM  Result Value Ref Range   Color, Urine YELLOW (A) YELLOW   APPearance CLEAR (A) CLEAR   Specific Gravity, Urine 1.009 1.005 - 1.030   pH 5.0 5.0 - 8.0   Glucose, UA NEGATIVE NEGATIVE mg/dL   Hgb urine dipstick SMALL (A) NEGATIVE   Bilirubin Urine NEGATIVE NEGATIVE   Ketones, ur 5 (A) NEGATIVE mg/dL   Protein, ur NEGATIVE NEGATIVE mg/dL   Nitrite NEGATIVE NEGATIVE   Leukocytes,Ua NEGATIVE NEGATIVE   RBC / HPF 0-5 0 - 5 RBC/hpf   WBC, UA 0-5 0 - 5 WBC/hpf   Bacteria, UA NONE SEEN NONE SEEN   Squamous Epithelial / LPF 0-5 0 - 5   Mucus PRESENT     Comment: Performed at [facility], [address], [place], [place] [address]  Urine Drug Screen, Qualitative     Status: Abnormal   Collection Time: 10/02/18  2:25 PM  Result Value Ref Range   Tricyclic, Ur Screen NONE DETECTED NONE DETECTED   Amphetamines, Ur Screen POSITIVE (A) NONE DETECTED   MDMA (Ecstasy)Ur Screen NONE DETECTED NONE DETECTED   Cocaine Metabolite,Ur [place] POSITIVE (A) NONE DETECTED   Opiate, Ur Screen NONE DETECTED NONE DETECTED   Phencyclidine (PCP) Ur S NONE DETECTED NONE DETECTED   Cannabinoid 50 Ng, Ur [place] NONE DETECTED NONE DETECTED   Barbiturates, Ur Screen NONE DETECTED NONE DETECTED   Benzodiazepine, Ur Scrn POSITIVE (A) NONE DETECTED   Methadone Scn, Ur NONE DETECTED NONE DETECTED    Comment: (NOTE) Tricyclics + metabolites, urine    Cutoff 1000 ng/mL Amphetamines + metabolites, urine  Cutoff 1000 ng/mL MDMA (Ecstasy), urine              Cutoff 500 ng/mL Cocaine Metabolite, urine          Cutoff 300 ng/mL Opiate + metabolites, urine        Cutoff 300 ng/mL Phencyclidine (PCP), urine         Cutoff 25 ng/mL Cannabinoid, urine                 Cutoff 50 ng/mL Barbiturates + metabolites, urine  Cutoff 200 ng/mL Benzodiazepine, urine               Cutoff 200 ng/mL Methadone, urine                   Cutoff 300 ng/mL The urine drug screen provides only a preliminary, unconfirmed analytical test result and should not be used for non-medical purposes. Clinical consideration and professional judgment should be applied to any positive drug screen result due to possible interfering substances. A more specific alternate chemical method must be used in order to obtain a confirmed analytical result. Gas chromatography / mass spectrometry (GC/MS) is the preferred confirmat ory method. Performed at [facility], [address] [place], [place] [address]   SARS Coronavirus 2 (CEPHEID - Performed in [facility] hospital lab), [facility] Order     Status: None   Collection Time: 10/02/18  2:25 PM  Result Value Ref Range   SARS Coronavirus 2 NEGATIVE NEGATIVE    Comment: (NOTE) If result  is NEGATIVE SARS-CoV-2 target nucleic acids are NOT DETECTED. The SARS-CoV-2 RNA is generally detectable in upper and lower  respiratory specimens during the acute phase of infection. The lowest  concentration of SARS-CoV-2 viral copies this assay can detect is 250  copies / mL. A negative result does not preclude SARS-CoV-2 infection  and should not be used as the sole basis for treatment or other  patient management decisions.  A negative result may occur with  improper specimen collection / handling, submission of specimen other  than nasopharyngeal swab, presence of viral mutation(s) within the  areas targeted by this assay, and inadequate number of viral copies  (<250 copies / mL). A negative result must be combined with clinical  observations, patient history, and epidemiological information. If result is POSITIVE SARS-CoV-2 target nucleic acids are DETECTED. The SARS-CoV-2 RNA is generally detectable in upper and lower  respiratory specimens dur ing the acute phase of infection.  Positive  results are indicative of active infection with  SARS-CoV-2.  Clinical  correlation with patient history and other diagnostic information is  necessary to determine patient infection status.  Positive results do  not rule out bacterial infection or co-infection with other viruses. If result is PRESUMPTIVE POSTIVE SARS-CoV-2 nucleic acids MAY BE PRESENT.   A presumptive positive result was obtained on the submitted specimen  and confirmed on repeat testing.  While 2019 novel coronavirus  (SARS-CoV-2) nucleic acids may be present in the submitted sample  additional confirmatory testing may be necessary for epidemiological  and / or clinical management purposes  to differentiate between  SARS-CoV-2 and other Sarbecovirus currently known to infect humans.  If clinically indicated additional testing with an alternate test  methodology [phone]) is advised. The SARS-CoV-2 RNA is generally  detectable in upper and lower respiratory sp ecimens during the acute  phase of infection. The expected result is Negative. Fact Sheet for Patients:  BoilerBrush.com.cy Fact Sheet for Healthcare Providers: https://pope.com/ This test is not yet approved or cleared by the [place] FDA and has been authorized for detection and/or diagnosis of SARS-CoV-2 by FDA under an Emergency Use Authorization (EUA).  This EUA will remain in effect (meaning this test can be used) for the duration of the COVID-19 declaration under Section 564(b)(1) of the Act, 21 U.S.C. section 360bbb-3(b)(1), unless the authorization is terminated or revoked sooner. Performed at [facility], [address] [place], [place] [address]   Ethanol     Status: None   Collection Time: 10/02/18  3:07 PM  Result Value Ref Range   Alcohol, Ethyl (B) <10 <10 mg/dL    Comment: (NOTE) Lowest detectable limit for serum alcohol is 10 mg/dL. For medical purposes only. Performed at [facility], [address]  [place], [place] [address]   Salicylate level     Status: None   Collection Time: 10/02/18  3:07 PM  Result Value Ref Range   Salicylate Lvl <7.0 2.8 - 30.0 mg/dL    Comment: Performed at [facility], [address] [place], [place] [address]  Acetaminophen level     Status: Abnormal   Collection Time: 10/02/18  3:07 PM  Result Value Ref Range   Acetaminophen (Tylenol), Serum <10 (L) 10 - 30 ug/mL    Comment: (NOTE) Therapeutic concentrations vary significantly. A range of 10-30 ug/mL  may be an effective concentration for many patients. However, some  are best treated at concentrations outside of this range. Acetaminophen concentrations >150 ug/mL at 4 hours  after ingestion  and >50 ug/mL at 12 hours after ingestion are often associated with  toxic reactions. Performed at [facility], [address] [place], [place] [address]     Current Facility-Administered Medications  Medication Dose Route Frequency Provider Last Rate Last Dose  . lactulose (CHRONULAC) 10 GM/15ML solution 20 g  20 g Oral Once Cuthriell, Delorise Royals, PA-C       Current Outpatient Medications  Medication Sig Dispense Refill  . albuterol (VENTOLIN HFA) 108 (90 Base) MCG/ACT inhaler Inhale 2 puffs into the lungs every 6 (six) hours as needed for wheezing or shortness of breath.    . fluticasone (FLONASE) 50 MCG/ACT nasal spray Place 2 sprays into both nostrils daily. 16 g 2  . ibuprofen (ADVIL,MOTRIN) 600 MG tablet Take 1 tablet (600 mg total) by mouth every 8 (eight) hours as needed. 30 tablet 2  . lactulose (CHRONULAC) 10 GM/15ML solution Take 15 mLs (10 g total) by mouth 2 (two) times daily as needed for mild constipation. 240 mL 4  . losartan (COZAAR) 25 MG tablet TAKE ONE TABLET BY MOUTH EVERY DAY 30 tablet 0  . omeprazole (PRILOSEC) 20 MG capsule Take 1 capsule (20 mg total) by mouth daily. 90 capsule 3  . umeclidinium bromide (INCRUSE ELLIPTA) 62.5 MCG/INH AEPB Inhale 1 puff into the lungs  daily.      Musculoskeletal: Strength & Muscle Tone: within normal limits Gait & Station: normal Patient leans: N/A  Psychiatric Specialty Exam: Physical Exam  Nursing note and vitals reviewed. Constitutional: She is oriented to person, place, and time. She appears well-developed and well-nourished.  HENT:  Head: Normocephalic and atraumatic.  Eyes: Pupils are equal, round, and reactive to light. Conjunctivae and EOM are normal.  Neck: Normal range of motion. Neck supple.  Cardiovascular: Normal rate and regular rhythm.  Respiratory: Effort normal and breath sounds normal.  Musculoskeletal: Normal range of motion.  Neurological: She is alert and oriented to person, place, and time. She has normal reflexes.  Skin: Skin is warm and dry.  Psychiatric: She has a normal mood and affect. Thought content normal.    ROS  Blood pressure 138/66, pulse 69, temperature 98.1 F (36.7 C), resp. rate 17, height  (1.575 m), weight 117.9 kg, last menstrual period 02/28/2014, SpO2 98 %.Body mass index is 47.55 kg/m.  General Appearance: Fairly Groomed  Eye Contact:  Poor  Speech:  Clear and Coherent  Volume:  Normal  Mood:  Anxious, Depressed and Hopeless  Affect:  Depressed and Flat  Thought Process:  Coherent  Orientation:  Full (Time, Place, and Person)  Thought Content:  Logical  Suicidal Thoughts:  Yes.  without intent/plan  Homicidal Thoughts:  No  Memory:  Immediate;   Good Recent;   Good  Judgement:  Poor  Insight:  Lacking  Psychomotor Activity:  Normal  Concentration:  Concentration: Poor and Attention Span: Poor  Recall:  [employer] of Knowledge:  Fair  Language:  Good  Akathisia:  Negative  Handed:  Right  AIMS (if indicated):     Assets:  Desire for Improvement Social Support  ADL's:  Intact  Cognition:  WNL  Sleep:   Okay     Treatment Plan Summary: Daily contact with patient to assess and evaluate symptoms and progress in treatment, Medication management  and Plan The patient currently meets criteria for psychiatric inpatient admission.  Disposition: Supportive therapy provided about ongoing stressors. The patient currently meets criteria for psychiatric inpatient admission.  Catalina Gravel, NP 10/03/2018 2:51 AM

## 2018-10-03 NOTE — ED Notes (Signed)
Resumed care from [place], [place]. Pt sleeping. Respirations even and unlabored.

## 2018-10-03 NOTE — ED Notes (Addendum)
Pt appears alert and oriented. She speaks clearly without confusion. Pt given phone to call her daughter.

## 2018-10-03 NOTE — Plan of Care (Addendum)
Monique Edwards is a 53 y.o. female patient admitted from ED awake, alert - oriented  X 4 - no acute distress noted.  VSS - Blood pressure 132/66, pulse 60, temperature 98.5 F (36.9 C), temperature source Oral, resp. rate 18, height 5\' 2"  (1.575 m), weight 117.9 kg, last menstrual period 02/28/2014, SpO2 100 %.  Patient admitted to [facility] after taking  six xanax. During admission patient very tearful, stating " I did not want to kill myself. I just lost my boyfriend and the father of my children also died. Patient also states she has flash backs, attends the [facility]. Patient states she uses an albuterol inhaler, and takes lactulose because of her liver disease.  Orientation to room, and floor completed with information packet given to patient/family.  Patient declined safety video at this time.  Admission INP armband ID verified with patient/family, and in place.   SR up x 2, fall assessment complete, with patient and family able to verbalize understanding of risk associated with falls, and verbalized understanding to call nsg before up out of bed.  Call light within reach, patient able to voice, and demonstrate understanding.  Skin, clean-dry- intact; patient has redness on center of chest. Patient informed RN, medical staff had to preform a sternal rub to keep her awake. Patient is cooperative and pleasant. Denies SI, HI and AVH. Safety checks Q 15 minutes to begin. No evidence of skin break down      Will cont to eval and treat per MD orders.  Leamon Arnt, RN 10/03/2018 5:03 PM

## 2018-10-03 NOTE — ED Notes (Signed)
Pt sleeping aeb snoring; breakfast at bedside

## 2018-10-04 DIAGNOSIS — F322 Major depressive disorder, single episode, severe without psychotic features: Principal | ICD-10-CM

## 2018-10-04 DIAGNOSIS — F1010 Alcohol abuse, uncomplicated: Secondary | ICD-10-CM

## 2018-10-04 DIAGNOSIS — T424X1A Poisoning by benzodiazepines, accidental (unintentional), initial encounter: Secondary | ICD-10-CM

## 2018-10-04 MED ORDER — CITALOPRAM HYDROBROMIDE 20 MG PO TABS
10.0000 mg | ORAL_TABLET | Freq: Every day | ORAL | Status: DC
  Administered 2018-10-04 – 2018-10-05 (×2): 10 mg via ORAL
  Filled 2018-10-04 (×2): qty 1

## 2018-10-04 MED ORDER — FLUTICASONE PROPIONATE 50 MCG/ACT NA SUSP
2.0000 | Freq: Every day | NASAL | Status: DC
  Administered 2018-10-04 – 2018-10-05 (×2): 2 via NASAL
  Filled 2018-10-04: qty 16

## 2018-10-04 MED ORDER — UMECLIDINIUM BROMIDE 62.5 MCG/INH IN AEPB
1.0000 | INHALATION_SPRAY | Freq: Every day | RESPIRATORY_TRACT | Status: DC
  Administered 2018-10-04 – 2018-10-05 (×2): 1 via RESPIRATORY_TRACT
  Filled 2018-10-04: qty 7

## 2018-10-04 MED ORDER — LOSARTAN POTASSIUM 25 MG PO TABS
25.0000 mg | ORAL_TABLET | Freq: Every day | ORAL | Status: DC
  Administered 2018-10-04 – 2018-10-05 (×2): 25 mg via ORAL
  Filled 2018-10-04 (×2): qty 1

## 2018-10-04 MED ORDER — PANTOPRAZOLE SODIUM 40 MG PO TBEC
40.0000 mg | DELAYED_RELEASE_TABLET | Freq: Every day | ORAL | Status: DC
  Administered 2018-10-04 – 2018-10-05 (×2): 40 mg via ORAL
  Filled 2018-10-04 (×2): qty 1

## 2018-10-04 MED ORDER — IBUPROFEN 600 MG PO TABS
600.0000 mg | ORAL_TABLET | Freq: Four times a day (QID) | ORAL | Status: DC | PRN
  Administered 2018-10-04: 600 mg via ORAL
  Filled 2018-10-04: qty 1

## 2018-10-04 NOTE — H&P (Signed)
Psychiatric Admission Assessment Adult  Patient Identification: Monique Edwards MRN:  [id number] Date of Evaluation:  10/04/2018 Chief Complaint:  MDD Principal Diagnosis: MDD (major depressive disorder), severe (HCC) Diagnosis:  Principal Problem:   MDD (major depressive disorder), severe (HCC) Active Problems:   Alcohol abuse   Benzodiazepine overdose  History of Present Illness: Patient seen and chart reviewed.  Patient brought to the emergency room because of confusion at home.  Not entirely clear if she brought herself there or if perhaps the family brought her in.  She appeared to have been intoxicated and confused and unable to give much of a history when she first arrived in the emergency room.  There was some mention of possible suicidal ideation but there is no indication that she ever really talked about killing herself.  Patient has been able to articulate that she took about 6 Xanax tablets on the day she presented.  She had also been drinking alcohol she claims although her alcohol level was negative.  She says that she feels sure that she was not trying to kill her self.  She did have some major stresses recently her long-term boyfriend died earlier this month.  Since then she has been living with her daughter.  She on the other hand says that she feels very close to her children and her grandchildren and has never entertained any thought of killing herself although she does feel down and sad and has had some trouble with motivation recently.  Denies any psychotic symptoms.  She minimizes her alcohol use saying that she drinks very rarely given that she had an alcohol problem in the past but the day of presentation was a relapse.  Also admits she recently used some cocaine but claims that was just once.  Not currently receiving any mental health treatment. Associated Signs/Symptoms: Depression Symptoms:  depressed mood, (Hypo) Manic Symptoms:  None Anxiety Symptoms:  None Psychotic  Symptoms:  None PTSD Symptoms: Negative Total Time spent with patient: 1 hour  Past Psychiatric History: Patient says she has never seen a psychiatrist or therapist or mental health provider before.  Never been in a psychiatric hospital.  Denies any history of suicide attempts.  She does say she has a long history of struggling with alcohol and occasionally other drug abuse but that she has managed to keep it under control entirely on her own without any intervention.  Is the patient at risk to self? No.  Has the patient been a risk to self in the past 6 months? No.  Has the patient been a risk to self within the distant past? No.  Is the patient a risk to others? Yes.    Has the patient been a risk to others in the past 6 months? No.  Has the patient been a risk to others within the distant past? No.   Prior Inpatient Therapy:   Prior Outpatient Therapy:    Alcohol Screening: 1. How often do you have a drink containing alcohol?: Monthly or less 2. How many drinks containing alcohol do you have on a typical day when you are drinking?: 3 or 4 3. How often do you have six or more drinks on one occasion?: Less than monthly AUDIT-C Score: 3 4. How often during the last year have you found that you were not able to stop drinking once you had started?: Never 5. How often during the last year have you failed to do what was normally expected from you becasue of drinking?: Never  6. How often during the last year have you needed a first drink in the morning to get yourself going after a heavy drinking session?: Never 7. How often during the last year have you had a feeling of guilt of remorse after drinking?: Never 8. How often during the last year have you been unable to remember what happened the night before because you had been drinking?: Never 9. Have you or someone else been injured as a result of your drinking?: No 10. Has a relative or friend or a doctor or another health worker been  concerned about your drinking or suggested you cut down?: No Alcohol Use Disorder Identification Test Final Score (AUDIT): 3 Alcohol Brief Interventions/Follow-up: Alcohol Education, AUDIT Score <7 follow-up not indicated Substance Abuse History in the last 12 months:  Yes.   Consequences of Substance Abuse: Medical Consequences:  Patient has a history of cirrhosis for which she has to take chronic lactulose. Previous Psychotropic Medications: No  Psychological Evaluations: No  Past Medical History:  Past Medical History:  Diagnosis Date  . COPD (chronic obstructive pulmonary disease) (HCC)   . Fatty liver   . GERD (gastroesophageal reflux disease)   . Hypertension     Past Surgical History:  Procedure Laterality Date  . CHOLECYSTECTOMY    . ESOPHAGOGASTRODUODENOSCOPY (EGD) WITH PROPOFOL N/A 06/06/2018   Procedure: ESOPHAGOGASTRODUODENOSCOPY (EGD) WITH PROPOFOL;  Surgeon: Wyline Mood, MD;  Location: [facility] ENDOSCOPY;  Service: Gastroenterology;  Laterality: N/A;   Family History:  Family History  Problem Relation Age of Onset  . Hypertension Mother   . Stroke Mother   . Diabetes Father   . Arthritis Sister   . Thyroid disease Sister   . Hypertension Brother   . Hypertension Brother    Family Psychiatric  History: Does not know of any family history Tobacco Screening: Have you used any form of tobacco in the last 30 days? (Cigarettes, Smokeless Tobacco, Cigars, and/or Pipes): No Social History:  Social History   Substance and Sexual Activity  Alcohol Use No   Comment: former occassional     Social History   Substance and Sexual Activity  Drug Use Yes  . Types: Cocaine    Additional Social History:                           Allergies:   Allergies  Allergen Reactions  . Amlodipine Swelling    Peripheral edema  . Lisinopril Cough   Lab Results:  Results for orders placed or performed during the hospital encounter of 10/02/18 (from the past 48 hour(s))   Glucose, capillary     Status: None   Collection Time: 10/02/18  2:23 PM  Result Value Ref Range   Glucose-Capillary 93 70 - 99 mg/dL  CBC with Differential     Status: Abnormal   Collection Time: 10/02/18  2:25 PM  Result Value Ref Range   WBC 4.1 4.0 - 10.5 K/uL   RBC 4.17 3.87 - 5.11 MIL/uL   Hemoglobin 11.6 (L) 12.0 - 15.0 g/dL   HCT 09.8 (L) 11.9 - 14.7 %   MCV 86.1 80.0 - 100.0 fL   MCH 27.8 26.0 - 34.0 pg   MCHC 32.3 30.0 - 36.0 g/dL   RDW 82.9 56.2 - 13.0 %   Platelets 76 (L) 150 - 400 K/uL    Comment: Immature Platelet Fraction may be clinically indicated, consider ordering this additional test [id number]    nRBC 0.0 0.0 -  0.2 %   Neutrophils Relative % 47 %   Neutro Abs 1.9 1.7 - 7.7 K/uL   Lymphocytes Relative 41 %   Lymphs Abs 1.7 0.7 - 4.0 K/uL   Monocytes Relative 9 %   Monocytes Absolute 0.4 0.1 - 1.0 K/uL   Eosinophils Relative 2 %   Eosinophils Absolute 0.1 0.0 - 0.5 K/uL   Basophils Relative 1 %   Basophils Absolute 0.0 0.0 - 0.1 K/uL   Immature Granulocytes 0 %   Abs Immature Granulocytes 0.01 0.00 - 0.07 K/uL    Comment: Performed at [facility], [address] [place], [place] [address]  Comprehensive metabolic panel     Status: None   Collection Time: 10/02/18  2:25 PM  Result Value Ref Range   Sodium 141 135 - 145 mmol/L   Potassium 3.9 3.5 - 5.1 mmol/L    Comment: HEMOLYSIS AT THIS LEVEL MAY AFFECT RESULT   Chloride 107 98 - 111 mmol/L   CO2 24 22 - 32 mmol/L   Glucose, Bld 94 70 - 99 mg/dL   BUN 10 6 - 20 mg/dL   Creatinine, Ser 6.04 0.44 - 1.00 mg/dL   Calcium 9.1 8.9 - 54.0 mg/dL   Total Protein 7.7 6.5 - 8.1 g/dL   Albumin 3.9 3.5 - 5.0 g/dL   AST 37 15 - 41 U/L   ALT 34 0 - 44 U/L   Alkaline Phosphatase 55 38 - 126 U/L   Total Bilirubin 1.1 0.3 - 1.2 mg/dL   GFR calc non Af Amer >60 >60 mL/min   GFR calc Af Amer >60 >60 mL/min   Anion gap 10 5 - 15    Comment: Performed at [facility], [address]  [place], [place] [address]  Ammonia     Status: Abnormal   Collection Time: 10/02/18  2:25 PM  Result Value Ref Range   Ammonia 43 (H) 9 - 35 umol/L    Comment: HEMOLYSIS AT THIS LEVEL MAY AFFECT RESULT Performed at [facility], [address] [place], [place] [address]   Lactic acid, plasma     Status: None   Collection Time: 10/02/18  2:25 PM  Result Value Ref Range   Lactic Acid, Venous 1.2 0.5 - 1.9 mmol/L    Comment: Performed at [facility], [address] [place], [place] [address]  Protime-INR     Status: None   Collection Time: 10/02/18  2:25 PM  Result Value Ref Range   Prothrombin Time 14.0 11.4 - 15.2 seconds   INR 1.1 0.8 - 1.2    Comment: (NOTE) INR goal varies based on device and disease states. Performed at [facility], [address] [place], [place] [address]   Urinalysis, Complete w Microscopic     Status: Abnormal   Collection Time: 10/02/18  2:25 PM  Result Value Ref Range   Color, Urine YELLOW (A) YELLOW   APPearance CLEAR (A) CLEAR   Specific Gravity, Urine 1.009 1.005 - 1.030   pH 5.0 5.0 - 8.0   Glucose, UA NEGATIVE NEGATIVE mg/dL   Hgb urine dipstick SMALL (A) NEGATIVE   Bilirubin Urine NEGATIVE NEGATIVE   Ketones, ur 5 (A) NEGATIVE mg/dL   Protein, ur NEGATIVE NEGATIVE mg/dL   Nitrite NEGATIVE NEGATIVE   Leukocytes,Ua NEGATIVE NEGATIVE   RBC / HPF 0-5 0 - 5 RBC/hpf   WBC, UA 0-5 0 - 5 WBC/hpf   Bacteria, UA NONE SEEN NONE SEEN  Squamous Epithelial / LPF 0-5 0 - 5   Mucus PRESENT     Comment: Performed at [facility], [address] [place], [place] [address]  Urine Drug Screen, Qualitative     Status: Abnormal   Collection Time: 10/02/18  2:25 PM  Result Value Ref Range   Tricyclic, Ur Screen NONE DETECTED NONE DETECTED   Amphetamines, Ur Screen POSITIVE (A) NONE DETECTED   MDMA (Ecstasy)Ur Screen NONE DETECTED NONE DETECTED   Cocaine Metabolite,Ur [place] POSITIVE (A) NONE DETECTED   Opiate, Ur Screen  NONE DETECTED NONE DETECTED   Phencyclidine (PCP) Ur S NONE DETECTED NONE DETECTED   Cannabinoid 50 Ng, Ur [place] NONE DETECTED NONE DETECTED   Barbiturates, Ur Screen NONE DETECTED NONE DETECTED   Benzodiazepine, Ur Scrn POSITIVE (A) NONE DETECTED   Methadone Scn, Ur NONE DETECTED NONE DETECTED    Comment: (NOTE) Tricyclics + metabolites, urine    Cutoff 1000 ng/mL Amphetamines + metabolites, urine  Cutoff 1000 ng/mL MDMA (Ecstasy), urine              Cutoff 500 ng/mL Cocaine Metabolite, urine          Cutoff 300 ng/mL Opiate + metabolites, urine        Cutoff 300 ng/mL Phencyclidine (PCP), urine         Cutoff 25 ng/mL Cannabinoid, urine                 Cutoff 50 ng/mL Barbiturates + metabolites, urine  Cutoff 200 ng/mL Benzodiazepine, urine              Cutoff 200 ng/mL Methadone, urine                   Cutoff 300 ng/mL The urine drug screen provides only a preliminary, unconfirmed analytical test result and should not be used for non-medical purposes. Clinical consideration and professional judgment should be applied to any positive drug screen result due to possible interfering substances. A more specific alternate chemical method must be used in order to obtain a confirmed analytical result. Gas chromatography / mass spectrometry (GC/MS) is the preferred confirmat ory method. Performed at [facility], [address] [place], [place] [address]   SARS Coronavirus 2 (CEPHEID - Performed in [facility] hospital lab), Hosp Order     Status: None   Collection Time: 10/02/18  2:25 PM  Result Value Ref Range   SARS Coronavirus 2 NEGATIVE NEGATIVE    Comment: (NOTE) If result is NEGATIVE SARS-CoV-2 target nucleic acids are NOT DETECTED. The SARS-CoV-2 RNA is generally detectable in upper and lower  respiratory specimens during the acute phase of infection. The lowest  concentration of SARS-CoV-2 viral copies this assay can detect is 250  copies / mL. A negative result  does not preclude SARS-CoV-2 infection  and should not be used as the sole basis for treatment or other  patient management decisions.  A negative result may occur with  improper specimen collection / handling, submission of specimen other  than nasopharyngeal swab, presence of viral mutation(s) within the  areas targeted by this assay, and inadequate number of viral copies  (<250 copies / mL). A negative result must be combined with clinical  observations, patient history, and epidemiological information. If result is POSITIVE SARS-CoV-2 target nucleic acids are DETECTED. The SARS-CoV-2 RNA is generally detectable in upper and lower  respiratory specimens dur ing the acute phase of infection.  Positive  results are indicative of active  infection with SARS-CoV-2.  Clinical  correlation with patient history and other diagnostic information is  necessary to determine patient infection status.  Positive results do  not rule out bacterial infection or co-infection with other viruses. If result is PRESUMPTIVE POSTIVE SARS-CoV-2 nucleic acids MAY BE PRESENT.   A presumptive positive result was obtained on the submitted specimen  and confirmed on repeat testing.  While 2019 novel coronavirus  (SARS-CoV-2) nucleic acids may be present in the submitted sample  additional confirmatory testing may be necessary for epidemiological  and / or clinical management purposes  to differentiate between  SARS-CoV-2 and other Sarbecovirus currently known to infect humans.  If clinically indicated additional testing with an alternate test  methodology [phone]) is advised. The SARS-CoV-2 RNA is generally  detectable in upper and lower respiratory sp ecimens during the acute  phase of infection. The expected result is Negative. Fact Sheet for Patients:  BoilerBrush.com.cy Fact Sheet for Healthcare Providers: https://pope.com/ This test is not yet approved or  cleared by the [place] FDA and has been authorized for detection and/or diagnosis of SARS-CoV-2 by FDA under an Emergency Use Authorization (EUA).  This EUA will remain in effect (meaning this test can be used) for the duration of the COVID-19 declaration under Section 564(b)(1) of the Act, 21 U.S.C. section 360bbb-3(b)(1), unless the authorization is terminated or revoked sooner. Performed at [facility], [address] [place], [place] [address]   Ethanol     Status: None   Collection Time: 10/02/18  3:07 PM  Result Value Ref Range   Alcohol, Ethyl (B) <10 <10 mg/dL    Comment: (NOTE) Lowest detectable limit for serum alcohol is 10 mg/dL. For medical purposes only. Performed at [facility], [address] [place], [place] [address]   Salicylate level     Status: None   Collection Time: 10/02/18  3:07 PM  Result Value Ref Range   Salicylate Lvl <7.0 2.8 - 30.0 mg/dL    Comment: Performed at [facility], [address] [place], [place] [address]  Acetaminophen level     Status: Abnormal   Collection Time: 10/02/18  3:07 PM  Result Value Ref Range   Acetaminophen (Tylenol), Serum <10 (L) 10 - 30 ug/mL    Comment: (NOTE) Therapeutic concentrations vary significantly. A range of 10-30 ug/mL  may be an effective concentration for many patients. However, some  are best treated at concentrations outside of this range. Acetaminophen concentrations >150 ug/mL at 4 hours after ingestion  and >50 ug/mL at 12 hours after ingestion are often associated with  toxic reactions. Performed at [facility], [address] [place], [place] [address]     Blood Alcohol level:  Lab Results  Component Value Date   [facility] <10 10/02/2018    Metabolic Disorder Labs:  Lab Results  Component Value Date   HGBA1C 5.7 (H) 03/19/2018   No results found for: PROLACTIN Lab Results  Component Value Date   CHOL 150 03/19/2018   TRIG 114 03/19/2018    HDL 54 03/19/2018   CHOLHDL 2.8 03/19/2018   LDLCALC 73 03/19/2018   LDLCALC 76 06/20/2017    Current Medications: Current Facility-Administered Medications  Medication Dose Route Frequency Provider Last Rate Last Dose  . acetaminophen (TYLENOL) tablet 650 mg  650 mg Oral Q6H PRN Thomspon, Adela Lank, NP      . albuterol (VENTOLIN HFA) 108 (90 Base) MCG/ACT inhaler 2 puff  2 puff Inhalation Q6H PRN Kobie Whidby  T, MD      . alum & mag hydroxide-simeth (MAALOX/MYLANTA) 200-200-20 MG/5ML suspension 30 mL  30 mL Oral Q4H PRN Thomspon, Adela Lank, NP      . citalopram (CELEXA) tablet 10 mg  10 mg Oral Daily Elina Streng T, MD   10 mg at 10/04/18 1233  . hydrOXYzine (ATARAX/VISTARIL) tablet 25 mg  25 mg Oral Q6H PRN Catalina Gravel, NP   25 mg at 10/04/18 0831  . ibuprofen (ADVIL) tablet 600 mg  600 mg Oral Q6H PRN Bahja Bence, Jackquline Denmark, MD   600 mg at 10/04/18 1234  . lactulose (CHRONULAC) 10 GM/15ML solution 20 g  20 g Oral Daily Andrika Peraza, Jackquline Denmark, MD   20 g at 10/04/18 0831  . losartan (COZAAR) tablet 25 mg  25 mg Oral Daily Artis Buechele, Jackquline Denmark, MD   25 mg at 10/04/18 1231  . magnesium hydroxide (MILK OF MAGNESIA) suspension 30 mL  30 mL Oral Daily PRN Catalina Gravel, NP      . pantoprazole (PROTONIX) EC tablet 40 mg  40 mg Oral Daily Manda Holstad, Jackquline Denmark, MD   40 mg at 10/04/18 1232  . traZODone (DESYREL) tablet 100 mg  100 mg Oral QHS Pacer Dorn T, MD   100 mg at 10/03/18 2255  . umeclidinium bromide (INCRUSE ELLIPTA) 62.5 MCG/INH 1 puff  1 puff Inhalation Daily Youa Deloney, Jackquline Denmark, MD   1 puff at 10/04/18 1234   PTA Medications: Medications Prior to Admission  Medication Sig Dispense Refill Last Dose  . albuterol (VENTOLIN HFA) 108 (90 Base) MCG/ACT inhaler Inhale 2 puffs into the lungs every 6 (six) hours as needed for wheezing or shortness of breath.   prn at prn  . fluticasone (FLONASE) 50 MCG/ACT nasal spray Place 2 sprays into both nostrils daily. 16 g 2 unknown at unknown  . lactulose  (CHRONULAC) 10 GM/15ML solution Take 15 mLs (10 g total) by mouth 2 (two) times daily as needed for mild constipation. 240 mL 4 prn at prn  . losartan (COZAAR) 25 MG tablet TAKE ONE TABLET BY MOUTH EVERY DAY 30 tablet 0 unknown at unknown  . omeprazole (PRILOSEC) 20 MG capsule Take 1 capsule (20 mg total) by mouth daily. 90 capsule 3 unknown at unknown  . umeclidinium bromide (INCRUSE ELLIPTA) 62.5 MCG/INH AEPB Inhale 1 puff into the lungs daily.   unknown at unknown    Musculoskeletal: Strength & Muscle Tone: within normal limits Gait & Station: normal Patient leans: N/A  Psychiatric Specialty Exam: Physical Exam  Nursing note and vitals reviewed. Constitutional: She appears well-developed and well-nourished.  HENT:  Head: Normocephalic and atraumatic.  Eyes: Pupils are equal, round, and reactive to light. Conjunctivae are normal.  Neck: Normal range of motion.  Cardiovascular: Regular rhythm and normal heart sounds.  Respiratory: Effort normal. No respiratory distress.  GI: Soft.  Musculoskeletal: Normal range of motion.  Neurological: She is alert.  Skin: Skin is warm and dry.  Psychiatric: She has a normal mood and affect. Her speech is normal and behavior is normal. Judgment and thought content normal. Cognition and memory are normal.    Review of Systems  Constitutional: Negative.   HENT: Negative.   Eyes: Negative.   Respiratory: Negative.   Cardiovascular: Negative.   Gastrointestinal: Negative.   Musculoskeletal: Negative.   Skin: Negative.   Neurological: Negative.   Psychiatric/Behavioral: Negative.     Blood pressure 129/71, pulse 62, temperature 97.8 F (36.6 C), temperature source Oral, resp. rate 18, height 5'  2" (1.575 m), weight 117.9 kg, last menstrual period 02/28/2014, SpO2 96 %.Body mass index is 47.54 kg/m.  General Appearance: Casual  Eye Contact:  Good  Speech:  Normal Rate  Volume:  Normal  Mood:  Euthymic  Affect:  Constricted  Thought  Process:  Coherent  Orientation:  Full (Time, Place, and Person)  Thought Content:  Logical  Suicidal Thoughts:  No  Homicidal Thoughts:  No  Memory:  Immediate;   Fair Recent;   Poor Remote;   Fair  Judgement:  Fair  Insight:  Fair  Psychomotor Activity:  Decreased  Concentration:  Concentration: Fair  Recall:  [employer] of Knowledge:  Fair  Language:  Fair  Akathisia:  No  Handed:  Right  AIMS (if indicated):     Assets:  Desire for Improvement Housing Social Support  ADL's:  Impaired  Cognition:  WNL  Sleep:  Number of Hours: 6    Treatment Plan Summary: Daily contact with patient to assess and evaluate symptoms and progress in treatment, Medication management and Plan Patient and I discussed grief versus depression and the possible use of antidepressants.  She thinks that it would be a good thing to try given how she has been feeling.  We will start citalopram 20 mg a day.  Patient also suffers from chronic cirrhosis but her current liver function tests are all stable except for a slightly elevated ammonia.  We will restart her lactulose.  15-minute checks for now.  Patient is already requesting discharge saying she needs to be at work first thing Monday morning.  We will reassess tomorrow for possible discharge  Observation Level/Precautions:  15 minute checks  Laboratory:  UA  Psychotherapy:    Medications:    Consultations:    Discharge Concerns:    Estimated LOS:  Other:     Physician Treatment Plan for Primary Diagnosis: MDD (major depressive disorder), severe (HCC) Long Term Goal(s): Improvement in symptoms so as ready for discharge  Short Term Goals: Ability to verbalize feelings will improve and Ability to demonstrate self-control will improve  Physician Treatment Plan for Secondary Diagnosis: Principal Problem:   MDD (major depressive disorder), severe (HCC) Active Problems:   Alcohol abuse   Benzodiazepine overdose  Long Term Goal(s): Improvement in  symptoms so as ready for discharge  Short Term Goals: Compliance with prescribed medications will improve and Ability to identify triggers associated with substance abuse/mental health issues will improve  I certify that inpatient services furnished can reasonably be expected to improve the patient's condition.    Mordecai Rasmussen, MD 5/30/20201:34 PM

## 2018-10-04 NOTE — [facility] Group Notes (Signed)
LCSW Group Therapy Note  10/04/2018 1:15pm  Type of Therapy and Topic:  Group Therapy:  Cognitive Distortions  Participation Level:  Active   Description of Group:    Patients in this group will be introduced to the topic of cognitive distortions.  Patients will identify and describe cognitive distortions, describe the feelings these distortions create for them.  Patients will identify one or more situations in their personal life where they have cognitively distorted thinking and will verbalize challenging this cognitive distortion through positive thinking skills.  Patients will practice the skill of using positive affirmations to challenge cognitive distortions using affirmation cards.    Therapeutic Goals:  1. Patient will identify two or more cognitive distortions they have used 2. Patient will identify one or more emotions that stem from use of a cognitive distortion 3. Patient will demonstrate use of a positive affirmation to counter a cognitive distortion through discussion and/or role play. 4. Patient will describe one way cognitive distortions can be detrimental to wellness   Summary of Patient Progress: The patient reported that she feels "okay". Patients were introduced to the topic of cognitive distortions. The patient was able to identify and describe cognitive distortions and the feelings these distortions create for her. Patient identified a situation in her personal life where she has cognitively distorted thinking and verbalized and challenged this cognitive distortion through positive thinking skills. Patient was able to provide support and validation to other group members.     Therapeutic Modalities:   Cognitive Behavioral Therapy Motivational Interviewing   Massa Pe  CUEBAS-COLON, LCSW 10/04/2018 12:40 PM

## 2018-10-04 NOTE — [facility] Suicide Risk Assessment (Signed)
[facility]   Nursing information obtained from:  Patient Demographic factors:  Divorced or widowed Current Mental Status:  Self-harm behaviors Loss Factors:  Loss of significant relationship Historical Factors:  Impulsivity Risk Reduction Factors:  Positive social support  Total Time spent with patient: 1 hour Principal Problem: MDD (major depressive disorder), severe (HCC) Diagnosis:  Principal Problem:   MDD (major depressive disorder), severe (HCC) Active Problems:   Alcohol abuse   Benzodiazepine overdose  Subjective Data: Patient seen and chart reviewed.  Patient came to the emergency room confused and oversedated.  Concerned about her ability to take care of herself.  Possible suicidal ideation but no clear documentation of specific suicidal thoughts or behavior.  On interview today the patient says she was intoxicated on Xanax and alcohol.  Admits to recent sadness but denies any suicidal ideation or intent.  No homicidal ideation.  No psychotic symptoms.  Continued Clinical Symptoms:  Alcohol Use Disorder Identification Test Final Score (AUDIT): 3 The "Alcohol Use Disorders Identification Test", Guidelines for Use in Primary Care, Second Edition.  World Science writerHealth Organization [facility](WHO). Score between 0-7:  no or low risk or alcohol related problems. Score between 8-15:  moderate risk of alcohol related problems. Score between 16-19:  high risk of alcohol related problems. Score 20 or above:  warrants further diagnostic evaluation for alcohol dependence and treatment.   CLINICAL FACTORS:   Depression:   Impulsivity Alcohol/Substance Abuse/Dependencies   Musculoskeletal: Strength & Muscle Tone: None Gait & Station: normal Patient leans: Right  Psychiatric Specialty Exam: Physical Exam  Nursing note and vitals reviewed. Constitutional: She appears well-developed and well-nourished.  HENT:  Head: Normocephalic and atraumatic.  Eyes: Pupils are  equal, round, and reactive to light. Conjunctivae are normal.  Neck: Normal range of motion.  Cardiovascular: Regular rhythm and normal heart sounds.  Respiratory: Effort normal.  GI: Soft.  Musculoskeletal: Normal range of motion.  Neurological: She is alert.  Skin: Skin is warm and dry.  Psychiatric: She has a normal mood and affect. Judgment normal. Her speech is delayed. She is slowed. Thought content is not paranoid. She expresses no homicidal and no suicidal ideation. She exhibits abnormal recent memory.    Review of Systems  Constitutional: Negative.   HENT: Negative.   Eyes: Negative.   Respiratory: Negative.   Cardiovascular: Negative.   Gastrointestinal: Negative.   Musculoskeletal: Negative.   Skin: Negative.   Neurological: Negative.   Psychiatric/Behavioral: Positive for memory loss and substance abuse. Negative for depression, hallucinations and suicidal ideas. The patient has insomnia. The patient is not nervous/anxious.     Blood pressure 129/71, pulse 62, temperature 97.8 F (36.6 C), temperature source Oral, resp. rate 18, height 5\' 2"  (1.575 m), weight 117.9 kg, last menstrual period 02/28/2014, SpO2 96 %.Body mass index is 47.54 kg/m.  General Appearance: Casual  Eye Contact:  Good  Speech:  Normal Rate  Volume:  Normal  Mood:  Euthymic  Affect:  Congruent  Thought Process:  Goal Directed  Orientation:  Full (Time, Place, and Person)  Thought Content:  Logical  Suicidal Thoughts:  No  Homicidal Thoughts:  No  Memory:  Immediate;   Fair Recent;   Fair Remote;   Fair  Judgement:  Fair  Insight:  Fair  Psychomotor Activity:  Normal  Concentration:  Concentration: Fair  Recall:  [employer]Fair  Fund of Knowledge:  Fair  Language:  Fair  Akathisia:  No  Handed:  Right  AIMS (if indicated):  Assets:  Communication Skills Desire for Improvement Housing Resilience  ADL's:  Intact  Cognition:  WNL  Sleep:  Number of Hours: 6      COGNITIVE FEATURES THAT  CONTRIBUTE TO RISK:  Loss of executive function    SUICIDE RISK:   Minimal: No identifiable suicidal ideation.  Patients presenting with no risk factors but with morbid ruminations; may be classified as minimal risk based on the severity of the depressive symptoms  PLAN OF CARE: Patient will be monitored for behavior and mood.  Started on low-dose antidepressant.  Engaged in individual and group counseling.  Reassess tomorrow for possible dangerousness given that we will consider discharge probably within the next day or so.  I certify that inpatient services furnished can reasonably be expected to improve the patient's condition.   Mordecai Rasmussen, MD 10/04/2018, 1:31 PM

## 2018-10-04 NOTE — Plan of Care (Signed)
Pt states sleep was "okay". Pt rates depression and anxiety 8/10. Pt denies SI, HI and AVH. Pt stated her goal was "nothing really" even after giving her simple ones she could choose from. Pt was educated on care plan and verbalizes understanding. Torrie Mayers RN Problem: Coping: Goal: Coping ability will improve Outcome: Not Progressing Goal: Will verbalize feelings Outcome: Progressing   Problem: Self-Concept: Goal: Will verbalize positive feelings about self Outcome: Not Progressing   Problem: Education: Goal: Knowledge of [place] [facility] Education information/materials will improve Outcome: Progressing Goal: Emotional status will improve Outcome: Not Progressing Goal: Mental status will improve Outcome: Not Progressing Goal: Verbalization of understanding the information provided will improve Outcome: Progressing   Problem: Activity: Goal: Interest or engagement in activities will improve Outcome: Progressing Goal: Sleeping patterns will improve Outcome: Not Progressing

## 2018-10-05 MED ORDER — TRAZODONE HCL 100 MG PO TABS: 100.0000 mg | ORAL_TABLET | Freq: Every day | ORAL | 1 refills | Status: DC

## 2018-10-05 MED ORDER — ALBUTEROL SULFATE HFA 108 (90 BASE) MCG/ACT IN AERS: 2.0000 | INHALATION_SPRAY | Freq: Four times a day (QID) | RESPIRATORY_TRACT | 1 refills | Status: DC | PRN

## 2018-10-05 MED ORDER — FLUTICASONE PROPIONATE 50 MCG/ACT NA SUSP: 2.0000 | Freq: Every day | NASAL | 2 refills | Status: DC

## 2018-10-05 MED ORDER — CITALOPRAM HYDROBROMIDE 10 MG PO TABS: 10.0000 mg | ORAL_TABLET | Freq: Every day | ORAL | 1 refills | Status: DC

## 2018-10-05 MED ORDER — PANTOPRAZOLE SODIUM 40 MG PO TBEC: 40.0000 mg | DELAYED_RELEASE_TABLET | Freq: Every day | ORAL | 1 refills | Status: DC

## 2018-10-05 MED ORDER — UMECLIDINIUM BROMIDE 62.5 MCG/INH IN AEPB: 1.0000 | INHALATION_SPRAY | Freq: Every day | RESPIRATORY_TRACT | 1 refills | Status: DC

## 2018-10-05 MED ORDER — LOSARTAN POTASSIUM 25 MG PO TABS: 25.0000 mg | ORAL_TABLET | Freq: Every day | ORAL | 1 refills | Status: DC

## 2018-10-05 MED ORDER — LACTULOSE 10 GM/15ML PO SOLN: 20.0000 g | Freq: Every day | ORAL | 1 refills | Status: DC

## 2018-10-05 NOTE — Progress Notes (Signed)
  [facility] Adult Case Management Discharge Plan :  Will you be returning to the same living situation after discharge:  No. At discharge, do you have transportation home?: Yes,  daughter will pick her up Do you have the ability to pay for your medications: No.  Release of information consent forms completed and in the chart;  Patient's signature needed at discharge.  Patient to Follow up at: Follow-up Information    [facility] Call.   Why:  CSW advised patient to call [facility] between Monday and Friday to start screening for services.  Contact information: [address] [place] [address] [phone]           Next level of care provider has access to [facility] Link:yes  Safety Planning and Suicide Prevention discussed: Yes,  CSW discussed with patient  Have you used any form of tobacco in the last 30 days? (Cigarettes, Smokeless Tobacco, Cigars, and/or Pipes): No  Has patient been referred to the Quitline?: N/A patient is not a smoker  Patient has been referred for addiction treatment: N/A  Richardo Popoff  CUEBAS-COLON, LCSW 10/05/2018, 12:02 PM

## 2018-10-05 NOTE — Discharge Summary (Signed)
Physician Discharge Summary Note  Patient:  Monique Edwards is an 53 y.o., female MRN:  [id number] DOB:  1965-10-06 Patient phone:  [phone] (home)  Patient address:   [address] [place] [id number],  Total Time spent with patient: 1 hour  Date of Admission:  10/03/2018 Date of Discharge: Oct 05, 2018  Reason for Admission: Patient was admitted through the emergency room where she presented with altered mental status that appeared to be related to substance abuse.  There was concern about possible overdose and possible dangerousness to herself.  Since she was admitted to the unit however she is completely denied suicidal ideation although she acknowledges she has had a lot of stress and sadness recently.  Principal Problem: MDD (major depressive disorder), severe (HCC) Discharge Diagnoses: Principal Problem:   MDD (major depressive disorder), severe (HCC) Active Problems:   Alcohol abuse   Benzodiazepine overdose   Past Psychiatric History: Patient describes a long history of bouts with substance abuse but claims to have had no previous treatment and no previous psychiatric treatment.  No past suicide attempts  Past Medical History:  Past Medical History:  Diagnosis Date  . COPD (chronic obstructive pulmonary disease) (HCC)   . Fatty liver   . GERD (gastroesophageal reflux disease)   . Hypertension     Past Surgical History:  Procedure Laterality Date  . CHOLECYSTECTOMY    . ESOPHAGOGASTRODUODENOSCOPY (EGD) WITH PROPOFOL N/A 06/06/2018   Procedure: ESOPHAGOGASTRODUODENOSCOPY (EGD) WITH PROPOFOL;  Surgeon: Wyline MoodAnna, Kiran, MD;  [facility];  Service: Gastroenterology;  Laterality: N/A;   Family History:  Family History  Problem Relation Age of Onset  . Hypertension Mother   . Stroke Mother   . Diabetes Father   . Arthritis Sister   . Thyroid disease Sister   . Hypertension Brother   . Hypertension Brother    Family Psychiatric  History: Does not report  any Social History:  Social History   Substance and Sexual Activity  Alcohol Use No   Comment: former occassional     Social History   Substance and Sexual Activity  Drug Use Yes  . Types: Cocaine    Social History   Socioeconomic History  . Marital status: Widowed    Spouse name: Not on file  . Number of children: Not on file  . Years of education: Not on file  . Highest education level: Not on file  Occupational History  . Not on file  Social Needs  . Financial resource strain: Not on file  . Food insecurity:    Worry: Not on file    Inability: Not on file  . Transportation needs:    Medical: Not on file    Non-medical: Not on file  Tobacco Use  . Smoking status: Current Some Day Smoker    Types: Cigarettes  . Smokeless tobacco: Never Used  . Tobacco comment: occasionally  Substance and Sexual Activity  . Alcohol use: No    Comment: former occassional  . Drug use: Yes    Types: Cocaine  . Sexual activity: Not on file  Lifestyle  . Physical activity:    Days per week: Not on file    Minutes per session: Not on file  . Stress: Not on file  Relationships  . Social connections:    Talks on phone: Not on file    Gets together: Not on file    Attends religious service: Not on file    Active member of club or organization: Not  on file    Attends meetings of clubs or organizations: Not on file    Relationship status: Not on file  Other Topics Concern  . Not on file  Social History Narrative  . Not on file    Hospital Course: Patient admitted to the psychiatric ward.  15-minute checks used.  Patient did not display any dangerous or aggressive behavior.  Made no suicide attempts or threats.  She was cooperative with treatment.  Patient after consultation with me started on low-dose antidepressant and as needed trazodone.  She has tolerated both of these.  No behavior problems.  Patient really emphasized that she needs to be at work Monday morning where she is at  risk of losing her job which is a major source of both financial and emotional stability for her.  Given that there did not seem to be a very high concern about suicidality on coming in and that the patient was absolutely denying any suicidal ideation now I was agreeable to discharging her.  She will be given referral information to [facility] and prescriptions for her current medicine.  She received counseling in the hospital she attended group she composed and took care of herself appropriately and consistently denied suicidal thoughts.  Physical Findings: AIMS:  , ,  ,  ,    CIWA:    COWS:     Musculoskeletal: Strength & Muscle Tone: within normal limits Gait & Station: normal Patient leans: N/A  Psychiatric Specialty Exam: Physical Exam  Nursing note and vitals reviewed. Constitutional: She appears well-developed and well-nourished.  HENT:  Head: Normocephalic and atraumatic.  Eyes: Pupils are equal, round, and reactive to light. Conjunctivae are normal.  Neck: Normal range of motion.  Cardiovascular: Regular rhythm and normal heart sounds.  Respiratory: Effort normal.  GI: Soft.  Musculoskeletal: Normal range of motion.  Neurological: She is alert.  Skin: Skin is warm and dry.  Psychiatric: She has a normal mood and affect. Her behavior is normal. Judgment and thought content normal.    Review of Systems  Constitutional: Negative.   HENT: Negative.   Eyes: Negative.   Respiratory: Negative.   Cardiovascular: Negative.   Gastrointestinal: Negative.   Musculoskeletal: Negative.   Skin: Negative.   Neurological: Negative.   Psychiatric/Behavioral: Negative.     Blood pressure (!) 157/79, pulse 65, temperature 97.8 F (36.6 C), temperature source Oral, resp. rate 18, height  (1.575 m), weight 117.9 kg, last menstrual period 02/28/2014, SpO2 100 %.Body mass index is 47.54 kg/m.  General Appearance: Casual  Eye Contact:  Good  Speech:  Clear and Coherent  Volume:  Normal   Mood:  Euthymic  Affect:  Congruent  Thought Process:  Goal Directed  Orientation:  Full (Time, Place, and Person)  Thought Content:  Logical  Suicidal Thoughts:  No  Homicidal Thoughts:  No  Memory:  Immediate;   Fair Recent;   Fair Remote;   Fair  Judgement:  Fair  Insight:  Fair  Psychomotor Activity:  Normal  Concentration:  Concentration: Fair  Recall:  [employer] of Knowledge:  Fair  Language:  Fair  Akathisia:  No  Handed:  Right  AIMS (if indicated):     Assets:  Desire for Improvement Housing Physical Health Resilience Social Support  ADL's:  Intact  Cognition:  WNL  Sleep:  Number of Hours: 6     Have you used any form of tobacco in the last 30 days? (Cigarettes, Smokeless Tobacco, Cigars, and/or Pipes): No  Has this patient used any form of tobacco in the last 30 days? (Cigarettes, Smokeless Tobacco, Cigars, and/or Pipes) Yes, No  Blood Alcohol level:  Lab Results  Component Value Date   ETH <10 10/02/2018    Metabolic Disorder Labs:  Lab Results  Component Value Date   HGBA1C 5.7 (H) 03/19/2018   No results found for: PROLACTIN Lab Results  Component Value Date   CHOL 150 03/19/2018   TRIG 114 03/19/2018   HDL 54 03/19/2018   CHOLHDL 2.8 03/19/2018   LDLCALC 73 03/19/2018   LDLCALC 76 06/20/2017    See Psychiatric Specialty Exam and Suicide Risk Assessment completed by Attending Physician prior to discharge.  Discharge destination:  Home  Is patient on multiple antipsychotic therapies at discharge:  No   Has Patient had three or more failed trials of antipsychotic monotherapy by history:  No  Recommended Plan for Multiple Antipsychotic Therapies: NA  Discharge Instructions    Diet - low sodium heart healthy   Complete by:  As directed    Increase activity slowly   Complete by:  As directed      Allergies as of 10/05/2018      Reactions   Amlodipine Swelling   Peripheral edema   Lisinopril Cough      Medication List    STOP  taking these medications   omeprazole 20 MG capsule Commonly known as:  PRILOSEC     TAKE these medications     Indication  albuterol 108 (90 Base) MCG/ACT inhaler Commonly known as:  VENTOLIN HFA Inhale 2 puffs into the lungs every 6 (six) hours as needed for wheezing or shortness of breath.  Indication:  Chronic Obstructive Lung Disease   citalopram 10 MG tablet Commonly known as:  CELEXA Take 1 tablet (10 mg total) by mouth daily. Start taking on:  October 06, 2018  Indication:  Depression   fluticasone 50 MCG/ACT nasal spray Commonly known as:  Flonase Place 2 sprays into both nostrils daily.  Indication:  Allergic Rhinitis   lactulose 10 GM/15ML solution Commonly known as:  CHRONULAC Take 30 mLs (20 g total) by mouth daily. Start taking on:  October 06, 2018 What changed:    how much to take  when to take this  reasons to take this  Indication:  Impaired Brain Function due to Liver Disease   losartan 25 MG tablet Commonly known as:  COZAAR Take 1 tablet (25 mg total) by mouth daily.  Indication:  High Blood Pressure Disorder   pantoprazole 40 MG tablet Commonly known as:  PROTONIX Take 1 tablet (40 mg total) by mouth daily. Start taking on:  October 06, 2018  Indication:  Gastroesophageal Reflux Disease   traZODone 100 MG tablet Commonly known as:  DESYREL Take 1 tablet (100 mg total) by mouth at bedtime.  Indication:  Trouble Sleeping   umeclidinium bromide 62.5 MCG/INH Aepb Commonly known as:  Incruse Ellipta Inhale 1 puff into the lungs daily. What changed:  when to take this  Indication:  Chronic Obstructive Lung Disease        Follow-up recommendations:  Activity:  Activity as tolerated Diet:  Regular diet Other:  Instructions given about use of antidepressants.  Encouraged to continue medicines and give it a try.  Strongly encouraged to involve herself in outpatient substance abuse treatment.  She was educated about the dangers of combining  benzodiazepines with alcohol or overdosing on benzodiazepines at all.  Patient agrees to give it a try.  At this point seems to no longer meet commitment criteria and to be capable of taking care of herself.  Comments: Patient also suffers from chronic mild hepatic encephalopathy from some degree of liver impairment.  She has continued lactulose in the hospital and will continue it as previously done as an outpatient.  Signed: Mordecai Rasmussen, MD 10/05/2018, 11:01 AM

## 2018-10-05 NOTE — Progress Notes (Signed)
D- Patient alert and oriented. Patient presents in a pleasant mood on assessment stating that she slept good last night and the only complaint she expressed was of right side pain and bilateral knee pain. Patient rated her pain level a "4/10" stating that she had the side pain for "about a week" and always has the knee pain that she's taking Ibuprofen for, however, she did not request any pain medication from this Clinical research associate. Patient rated her depression a "1 or 2/10" and her anxiety a "5 or 6/10", however, she does not know why she's anxious. Patient denies SI, HI, AVH, at this time. Patient's goal for today is to "go home".  A- Scheduled medications administered to patient, per MD orders. Support and encouragement provided.  Routine safety checks conducted every 15 minutes.  Patient informed to notify staff with problems or concerns.  R- No adverse drug reactions noted. Patient contracts for safety at this time. Patient compliant with medications and treatment plan. Patient receptive, calm, and cooperative. Patient interacts well with others on the unit.  Patient remains safe at this time.

## 2018-10-05 NOTE — Plan of Care (Signed)
Patient observed to interact with other patients as observed in the group room. Denies SI but acknowledges depression related to losses of significant others. Given additional medication for anxiety and depression. Needs reinforcement regarding avoidance of self medication without prescriptions. Tolerating treatment protocol without severe adverse reaction. Problem: Education: Goal: Emotional status will improve Outcome: Progressing Goal: Mental status will improve Outcome: Progressing   Problem: Activity: Goal: Interest or engagement in activities will improve Outcome: Progressing Goal: Sleeping patterns will improve Outcome: Progressing

## 2018-10-05 NOTE — BHH Suicide Risk Assessment (Signed)
[facility] Discharge Suicide Risk Assessment   Principal Problem: MDD (major depressive disorder), severe (HCC) Discharge Diagnoses: Principal Problem:   MDD (major depressive disorder), severe (HCC) Active Problems:   Alcohol abuse   Benzodiazepine overdose   Total Time spent with patient: 45 minutes  Musculoskeletal: Strength & Muscle Tone: within normal limits Gait & Station: normal Patient leans: N/A  Psychiatric Specialty Exam: Review of Systems  Constitutional: Negative.   HENT: Negative.   Eyes: Negative.   Respiratory: Negative.   Cardiovascular: Negative.   Gastrointestinal: Negative.   Musculoskeletal: Negative.   Skin: Negative.   Neurological: Negative.   Psychiatric/Behavioral: Negative.     Blood pressure (!) 157/79, pulse 65, temperature 97.8 F (36.6 C), temperature source Oral, resp. rate 18, height 5\' 2"  (1.575 m), weight 117.9 kg, last menstrual period 02/28/2014, SpO2 100 %.Body mass index is 47.54 kg/m.  General Appearance: Casual  Eye Contact::  Good  Speech:  Normal Rate409  Volume:  Normal  Mood:  Euthymic  Affect:  Constricted  Thought Process:  Goal Directed  Orientation:  Full (Time, Place, and Person)  Thought Content:  Logical  Suicidal Thoughts:  No  Homicidal Thoughts:  No  Memory:  Immediate;   Fair Recent;   Fair Remote;   Fair  Judgement:  Fair  Insight:  Fair  Psychomotor Activity:  Normal  Concentration:  Fair  Recall:  [employer] of Knowledge:Fair  Language: Fair  Akathisia:  No  Handed:  Right  AIMS (if indicated):     Assets:  Desire for Improvement Housing Physical Health Resilience Social Support  Sleep:  Number of Hours: 6  Cognition: WNL  ADL's:  Intact   Mental Status Per Nursing Assessment::   On Admission:  Self-harm behaviors  Demographic Factors:  Divorced or widowed and Caucasian  Loss Factors: Loss of significant relationship  Historical Factors: Prior suicide attempts  Risk Reduction Factors:    Responsible for children under 23 years of age, Sense of responsibility to family, Religious beliefs about death, Living with another person, especially a relative, Positive social support and Positive coping skills or problem solving skills  Continued Clinical Symptoms:  Depression:   Comorbid alcohol abuse/dependence Alcohol/Substance Abuse/Dependencies  Cognitive Features That Contribute To Risk:  None    Suicide Risk:  Minimal: No identifiable suicidal ideation.  Patients presenting with no risk factors but with morbid ruminations; may be classified as minimal risk based on the severity of the depressive symptoms    Plan Of Care/Follow-up recommendations:  Activity:  Activity as tolerated Diet:  Regular diet Other:  Patient will be referred to [facility] and strongly encouraged to follow-up with outpatient treatment for substance abuse and mental health.  Also given prescription for antidepressant medication at discharge.  She agrees to the plan.  Mordecai Rasmussen, MD 10/05/2018, 10:48 AM

## 2018-10-05 NOTE — Progress Notes (Signed)
Patient ID: Monique Edwards, female   DOB: 04-Mar-1966, 53 y.o.   MRN: [id number]   Discharge Note:  Patient denies SI/HI/AVH at this time. Discharge instructions, AVS, prescriptions, and transition record gone over with patient. Patient agrees to comply with medication management, follow-up visit, and outpatient therapy. Patient belongings returned to patient. Patient questions and concerns addressed and answered. Patient ambulatory off unit. Patient discharged to home with daughter.

## 2018-10-05 NOTE — Progress Notes (Signed)
Patient's BP continues to be elevated. Patient was given morning BP medication and will continue to monitor. This Clinical research associate will notify MD.

## 2018-10-05 NOTE — [facility] Suicide Risk Assessment (Signed)
[facility] INPATIENT:  Family/Significant Other Suicide Prevention Education  Suicide Prevention Education:  Education Completed; with Margette Fast, daughter, at [phone] has been identified by the patient as the family member/significant other with whom the patient will be residing, and identified as the person(s) who will aid the patient in the event of a mental health crisis (suicidal ideations/suicide attempt).  With written consent from the patient, the family member/significant other has been provided the following suicide prevention education, prior to the and/or following the discharge of the patient.  The suicide prevention education provided includes the following:  Suicide risk factors  Suicide prevention and interventions  National Suicide Hotline telephone number  [facility] assessment telephone number  [facility] Emergency Assistance 911  [facility] and/or Residential Mobile Crisis Unit telephone number  Request made of family/significant other to:  Remove weapons (e.g., guns, rifles, knives), all items previously/currently identified as safety concern.    Remove drugs/medications (over-the-counter, prescriptions, illicit drugs), all items previously/currently identified as a safety concern.  The family member/significant other verbalizes understanding of the suicide prevention education information provided.  The family member/significant other agrees to remove the items of safety concern listed above.  Va Broadwell  CUEBAS-COLON 10/05/2018, 11:59 AM

## 2018-10-05 NOTE — [facility] Group Notes (Signed)
LCSW Group Therapy Note 10/05/2018 1:15pm  Type of Therapy and Topic: Group Therapy: Feelings Around Returning Home & Establishing a Supportive Framework and Supporting Oneself When Supports Not Available  Participation Level: Did Not Attend  Description of Group:  Patients first processed thoughts and feelings about upcoming discharge. These included fears of upcoming changes, lack of change, new living environments, judgements and expectations from others and overall stigma of mental health issues. The group then discussed the definition of a supportive framework, what that looks and feels like, and how do to discern it from an unhealthy non-supportive network. The group identified different types of supports as well as what to do when your family/friends are less than helpful or unavailable  Therapeutic Goals  1. Patient will identify one healthy supportive network that they can use at discharge. 2. Patient will identify one factor of a supportive framework and how to tell it from an unhealthy network. 3. Patient able to identify one coping skill to use when they do not have positive supports from others. 4. Patient will demonstrate ability to communicate their needs through discussion and/or role plays.  Summary of Patient Progress:  Pt was invited to attend group but chose not to attend. CSW will continue to encourage pt to attend group throughout their admission.   Therapeutic Modalities Cognitive Behavioral Therapy Motivational Interviewing   Akisha Sturgill  CUEBAS-COLON, LCSW 10/05/2018 10:34 AM

## 2018-10-05 NOTE — Progress Notes (Signed)
D- Patient alert and oriented.Continues with sad affect and depressed mood. . Denies SI  and pain.  Goal: to learn how to cope with such bad losses. Has not deveoped a goal so far but agrees to consider it.. A- Scheduled medications administered to patient per MD orders. Support and encouragement provided.  Routine safety checks conducted every 15 minutes.  Patient informed to notify staff with problems or concerns. R- No adverse drug reactions noted. Patient contracts for safety at this time. Patient compliant with medications and treatment plan. Patient receptive and cooperative.  Patient remains safe at this time.
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
## 2018-10-16 ENCOUNTER — Telehealth: Payer: Self-pay | Admitting: Pharmacist

## 2018-10-16 NOTE — Telephone Encounter (Signed)
10/16/2018 10:14:26 AM - refills online [facility] for Ventolin& Incruse  10/16/2018 Placed refills online with [facility] for Ventolin HFA & Incruse Ellipta 62.62mcg, to ship 10/29/2018, order# [id number].Delos Haring

## 2018-10-23 ENCOUNTER — Ambulatory Visit: Payer: Self-pay | Admitting: Adult Health Nurse Practitioner

## 2018-10-23 ENCOUNTER — Other Ambulatory Visit: Payer: Self-pay

## 2018-10-23 ENCOUNTER — Ambulatory Visit: Payer: Self-pay | Admitting: Licensed Clinical Social Worker

## 2018-10-23 DIAGNOSIS — R609 Edema, unspecified: Secondary | ICD-10-CM

## 2018-10-23 DIAGNOSIS — F4323 Adjustment disorder with mixed anxiety and depressed mood: Secondary | ICD-10-CM

## 2018-10-23 DIAGNOSIS — J301 Allergic rhinitis due to pollen: Secondary | ICD-10-CM

## 2018-10-23 DIAGNOSIS — F1590 Other stimulant use, unspecified, uncomplicated: Secondary | ICD-10-CM
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
## 2018-10-23 NOTE — Progress Notes (Signed)
  Patient: Monique Edwards Female    DOB: August 05, 1965   53 y.o.   MRN: [id number] Visit Date: 10/23/2018  Today's Provider: [facility]   No chief complaint on file.  Subjective:    HPI  Telephonic visit.   States that she has been having headaches almost every day with sneezing, coughing  and SOB along with fatigue and ankle edema. States that her edema stays the same all the time L>R.  This has been occurring over the past 1-2 weeks. She thinks her inhaler is not effective. States she is using the albuterol rescue inhaler about 2 times per week. Not on any allergy medications.  Denies sensitivity to light.     Allergies  Allergen Reactions  . Amlodipine Swelling    Peripheral edema  . Lisinopril Cough   Previous Medications   ALBUTEROL (VENTOLIN HFA) 108 (90 BASE) MCG/ACT INHALER    Inhale 2 puffs into the lungs every 6 (six) hours as needed for wheezing or shortness of breath.   CITALOPRAM (CELEXA) 10 MG TABLET    Take 1 tablet (10 mg total) by mouth daily.   FLUTICASONE (FLONASE) 50 MCG/ACT NASAL SPRAY    Place 2 sprays into both nostrils daily.   LACTULOSE (CHRONULAC) 10 GM/15ML SOLUTION    Take 30 mLs (20 g total) by mouth daily.   LOSARTAN (COZAAR) 25 MG TABLET    Take 1 tablet (25 mg total) by mouth daily.   PANTOPRAZOLE (PROTONIX) 40 MG TABLET    Take 1 tablet (40 mg total) by mouth daily.   TRAZODONE (DESYREL) 100 MG TABLET    Take 1 tablet (100 mg total) by mouth at bedtime.   UMECLIDINIUM BROMIDE (INCRUSE ELLIPTA) 62.5 MCG/INH AEPB    Inhale 1 puff into the lungs daily.    Review of Systems  All other systems reviewed and are negative.   Social History   Tobacco Use  . Smoking status: Current Some Day Smoker    Types: Cigarettes  . Smokeless tobacco: Never Used  . Tobacco comment: occasionally  Substance Use Topics  . Alcohol use: No    Comment: former occassional   Objective:   LMP 02/28/2014 (Approximate)   Physical Exam  No PE.       Assessment & Plan:        Allergic rhinitis:  Come to clinic to get Zyrtec.  Continue albuterol rescue inhaler and incruse.   Monitor edema- elevated legs and feet while sitting.   Continue with Nira Conn due to anxiety control- could be a component of her not feeling well.   RTC in 2 weeks if symptoms not improved.    [facility]

## 2018-10-23 NOTE — Progress Notes (Deleted)
Established Patient Office Visit  Subjective:  Patient ID: Monique Edwards, female    DOB: 11-27-1965  Age: 53 y.o. MRN: [id number] Monique Laity CC: No chief complaint on file.   HPI Monique Edwards presents for ***  Past Medical History:  Diagnosis Date  . COPD (chronic obstructive pulmonary disease) (HCC)   . Fatty liver   . GERD (gastroesophageal reflux disease)   . Hypertension     Past Surgical History:  Procedure Laterality Date  . CHOLECYSTECTOMY    . ESOPHAGOGASTRODUODENOSCOPY (EGD) WITH PROPOFOL N/A 06/06/2018   Procedure: ESOPHAGOGASTRODUODENOSCOPY (EGD) WITH PROPOFOL;  Surgeon: Wyline MoodAnna, Kiran, MD;  [facility];  Service: Gastroenterology;  Laterality: N/A;    Family History  Problem Relation Age of Onset  . Hypertension Mother   . Stroke Mother   . Diabetes Father   . Arthritis Sister   . Thyroid disease Sister   . Hypertension Brother   . Hypertension Brother     Social History   Socioeconomic History  . Marital status: Widowed    Spouse name: Not on file  . Number of children: Not on file  . Years of education: Not on file  . Highest education level: Not on file  Occupational History  . Not on file  Social Needs  . Financial resource strain: Not on file  . Food insecurity    Worry: Not on file    Inability: Not on file  . Transportation needs    Medical: Not on file    Non-medical: Not on file  Tobacco Use  . Smoking status: Current Some Day Smoker    Types: Cigarettes  . Smokeless tobacco: Never Used  . Tobacco comment: occasionally  Substance and Sexual Activity  . Alcohol use: No    Comment: former occassional  . Drug use: Yes    Types: Cocaine  . Sexual activity: Not on file  Lifestyle  . Physical activity    Days per week: Not on file    Minutes per session: Not on file  . Stress: Not on file  Relationships  . Social Musicianconnections    Talks on phone: Not on file    Gets together: Not on file    Attends religious service: Not on file     Active member of club or organization: Not on file    Attends meetings of clubs or organizations: Not on file    Relationship status: Not on file  . Intimate partner violence    Fear of current or ex partner: Not on file    Emotionally abused: Not on file    Physically abused: Not on file    Forced sexual activity: Not on file  Other Topics Concern  . Not on file  Social History Narrative  . Not on file    Outpatient Medications Prior to Visit  Medication Sig Dispense Refill  . albuterol (VENTOLIN HFA) 108 (90 Base) MCG/ACT inhaler Inhale 2 puffs into the lungs every 6 (six) hours as needed for wheezing or shortness of breath. 1 Inhaler 1  . citalopram (CELEXA) 10 MG tablet Take 1 tablet (10 mg total) by mouth daily. 30 tablet 1  . fluticasone (FLONASE) 50 MCG/ACT nasal spray Place 2 sprays into both nostrils daily. 16 g 2  . lactulose (CHRONULAC) 10 GM/15ML solution Take 30 mLs (20 g total) by mouth daily. 236 mL 1  . losartan (COZAAR) 25 MG tablet Take 1 tablet (25 mg total) by mouth daily. 30 tablet 1  .  pantoprazole (PROTONIX) 40 MG tablet Take 1 tablet (40 mg total) by mouth daily. 30 tablet 1  . traZODone (DESYREL) 100 MG tablet Take 1 tablet (100 mg total) by mouth at bedtime. 30 tablet 1  . umeclidinium bromide (INCRUSE ELLIPTA) 62.5 MCG/INH AEPB Inhale 1 puff into the lungs daily. 1 each 1   No facility-administered medications prior to visit.     Allergies  Allergen Reactions  . Amlodipine Swelling    Peripheral edema  . Lisinopril Cough    ROS Review of Systems    Objective:    Physical Exam  LMP 02/28/2014 (Approximate)  Wt Readings from Last 3 Encounters:  10/02/18 260 lb (117.9 kg)  07/28/18 270 lb (122.5 kg)  07/20/18 270 lb (122.5 kg)     Health Maintenance Due  Topic Date Due  . HIV Screening  03/26/1979  . TETANUS/TDAP  03/26/1983  . COLONOSCOPY  03/25/2014    There are no preventive care reminders to display for this patient.  Lab  Results  Component Value Date   TSH 2.130 03/19/2018   Lab Results  Component Value Date   WBC 4.1 10/02/2018   HGB 11.6 (L) 10/02/2018   HCT 35.9 (L) 10/02/2018   MCV 86.1 10/02/2018   PLT 76 (L) 10/02/2018   Lab Results  Component Value Date   NA 141 10/02/2018   K 3.9 10/02/2018   CO2 24 10/02/2018   GLUCOSE 94 10/02/2018   BUN 10 10/02/2018   CREATININE 0.91 10/02/2018   BILITOT 1.1 10/02/2018   ALKPHOS 55 10/02/2018   AST 37 10/02/2018   ALT 34 10/02/2018   PROT 7.7 10/02/2018   ALBUMIN 3.9 10/02/2018   CALCIUM 9.1 10/02/2018   ANIONGAP 10 10/02/2018   Lab Results  Component Value Date   CHOL 150 03/19/2018   Lab Results  Component Value Date   HDL 54 03/19/2018   Lab Results  Component Value Date   LDLCALC 73 03/19/2018   Lab Results  Component Value Date   TRIG 114 03/19/2018   Lab Results  Component Value Date   CHOLHDL 2.8 03/19/2018   Lab Results  Component Value Date   HGBA1C 5.7 (H) 03/19/2018      Assessment & Plan:   Problem List Items Addressed This Visit    None    Visit Diagnoses    Adjustment disorder with mixed anxiety and depressed mood    -  Primary   Stimulant use disorder          No orders of the defined types were placed in this encounter.   Follow-up: Return in about 2 weeks (around 11/06/2018).    Althia FortsHeather B Martena Emanuele, LCSW Integrated Behavioral Health Comprehensive Clinical Assessment Via Phone  MRN: [id number] Name: Monique Edwards  Session Time: {Time; Appointment:21385} - {Time; Appointment:21385} Total time: {IBH Total Time:21014050}  Type of Service: Integrated Behavioral Health-Individual Interpretor: {yes WU:981191}no:314532} Interpretor Name and Language: ***  PRESENTING CONCERNS: Monique Edwards is a 53 y.o. female accompanied by {Patient accompanied [phone]}. [facility] ***.  Previous mental health services Have you ever been treated for a mental  health problem? {BHH YES OR NO:22294} If "Yes", when were you treated and whom did you see? *** Have you ever been hospitalized for mental health treatment? {BHH YES OR NO:22294} Have you ever been treated for any of the following? Past Psychiatric History/Hospitalization(s): Anxiety: {BHH YES OR NO:22294} Bipolar Disorder: {BHH YES OR NO:22294} Depression: {BHH  YES OR NO:22294} Mania: {BHH YES OR NO:22294} Psychosis: {BHH YES OR NO:22294} Schizophrenia: {BHH YES OR NO:22294} Personality Disorder: {BHH YES OR NO:22294} Hospitalization for psychiatric illness: {BHH YES OR NO:22294} History of Electroconvulsive Shock Therapy: {BHH YES OR NO:22294} Prior Suicide Attempts: {BHH YES OR NO:22294} Have you ever had thoughts of harming yourself or others or attempted suicide? {CHL AMB BH Suicide Current Mental Status:21022748}  Medical history  has a past medical history of COPD (chronic obstructive pulmonary disease) ([place]), Fatty liver, GERD (gastroesophageal reflux disease), and Hypertension. Primary Care Physician: Baxter Hire, MD Date of last physical exam: *** Allergies:  Allergies  Allergen Reactions  . Amlodipine Swelling    Peripheral edema  . Lisinopril Cough   Current medications:  Outpatient Encounter Medications as of 10/23/2018  Medication Sig  . albuterol (VENTOLIN HFA) 108 (90 Base) MCG/ACT inhaler Inhale 2 puffs into the lungs every 6 (six) hours as needed for wheezing or shortness of breath.  . citalopram (CELEXA) 10 MG tablet Take 1 tablet (10 mg total) by mouth daily.  . fluticasone (FLONASE) 50 MCG/ACT nasal spray Place 2 sprays into both nostrils daily.  Marland Kitchen lactulose (CHRONULAC) 10 GM/15ML solution Take 30 mLs (20 g total) by mouth daily.  Marland Kitchen losartan (COZAAR) 25 MG tablet Take 1 tablet (25 mg total) by mouth daily.  . pantoprazole (PROTONIX) 40 MG tablet Take 1 tablet (40 mg total) by mouth daily.  . traZODone (DESYREL) 100 MG tablet Take 1 tablet (100 mg total) by  mouth at bedtime.  Marland Kitchen umeclidinium bromide (INCRUSE ELLIPTA) 62.5 MCG/INH AEPB Inhale 1 puff into the lungs daily.   No facility-administered encounter medications on file as of 10/23/2018.    Have you ever had any serious medication reactions? {No or If yes, please specify:20789} Is there any history of mental health problems or substance abuse in your family? {No or If yes, please specify:20789} Has anyone in your family been hospitalized for mental health treatment? {No or If yes, please specify:20789}  Social/family history Who lives in your current household? *** What is your family of origin, childhood history? *** Where were you born? *** Where did you grow up? *** How many different homes have you lived in? *** Describe your childhood: *** Do you have siblings, step/half siblings? {No or If yes, please specify:20789} What are their names, relation, sex, age? *** Are your parents separated or divorced? {No or If yes, please specify:20789} What are your social supports? ***  Education How many grades have you completed? {Misc; education levels:33222} Did you have any problems in school? {No or If yes, please specify:20789}  Employment/financial issues ***  Sleep Usual bedtime is *** {Time; am/pm:31393} Sleeping arrangements: *** Problems with snoring: {CHL AMB YES/NO/NOT KNOWN:210130105} Obstructive sleep apnea {CHL AMB IS/IS NOT:210130109} a concern. Problems with nightmares: {No or If yes, please specify:20789} Problems with night terrors: {No or If yes, please specify:20789} Problems with sleepwalking: {CHL AMB YES/NO/NOT KNOWN:210130105}  Trauma/Abuse history Have you ever experienced or been exposed to any form of abuse? {No or If yes, please specify:20789} Have you ever experienced or been exposed to something traumatic? {No or If yes, please specify:20789}  Substance use Do you use alcohol, nicotine or caffeine? {findings; intake  alcohol/tobacco/caffeine:30392} How old were you when you first tasted alcohol? *** Have you ever used illicit drugs or abused prescription medications? {Drugs involved:60372}  Mental status General appearance/Behavior: {BHH GENERAL APPEARANCE/BEHAVIOR:22300} Eye contact: {BHH EYE CONTACT:22301} Motor behavior: {BHH MOTOR BEHAVIOR:22302} Speech: {BHH SPEECH:22304}  Level of consciousness: {BHH LEVEL OF CONSCIOUSNESS:22305} Mood: {BHH MOOD:22306} Affect: {BHH AFFECT:22307} Anxiety level: {BHH ANXIETY LEVEL:22308} Thought process: {BHH THOUGHT PROCESS:22309} Thought content: {BHH THOUGHT CONTENT:22310} Perception: {BHH PERCEPTION:22311} Judgment: {BHH JUDGMENT:22312} Insight: {BHH INSIGHT:22313}  Diagnosis No diagnosis found.  GOALS ADDRESSED: Patient will reduce symptoms of: {IBH Symptoms:21014056} and increase knowledge and/or ability of: {IBH Patient Tools:21014057} and also: {IBH Goals:21014053}              INTERVENTIONS: Interventions utilized: {IBH Interventions:21014054} Standardized Assessments completed: {IBH Screening Tools:21014051}   ASSESSMENT/OUTCOME: ***  PLAN: ***  Scheduled next visit: ***  Althia FortsHeather B Marzell Isakson Clinical Social Work

## 2018-10-28 ENCOUNTER — Encounter: Payer: Self-pay | Admitting: Licensed Clinical Social Worker

## 2018-10-28 NOTE — Patient Instructions (Signed)
See follow up note. 

## 2018-10-30 ENCOUNTER — Other Ambulatory Visit: Payer: Self-pay

## 2018-10-30 ENCOUNTER — Ambulatory Visit: Payer: Self-pay | Admitting: Licensed Clinical Social Worker

## 2018-10-30 DIAGNOSIS — F1590 Other stimulant use, unspecified, uncomplicated: Secondary | ICD-10-CM

## 2018-10-30 DIAGNOSIS — T424X2D Poisoning by benzodiazepines, intentional self-harm, subsequent encounter: Secondary | ICD-10-CM

## 2018-10-30 DIAGNOSIS — F4323 Adjustment disorder with mixed anxiety and depressed mood: Secondary | ICD-10-CM

## 2018-10-30 NOTE — BH Specialist Note (Signed)
Clinician explained to the patient that she had a case consultation with Dr. Octavia Heir, MD, Psychiatric consultant who recommended that due to her history of abusing drugs and recent overdose that she start the Substance Abuse Intensive Outpatient program at [facility]. It is recommended that [facility] follow her psychotropic medications at this point in time. She explained to the patient that until her substance abuse is treated and she has some recovery time under her belt that she nor the psychiatrist can treat her mental health symptoms.  Patient was agreeable to the plans. Her discharge from her overdose at [facility] made a referral to [facility]. She was provided with the contact information for [facility] again.

## 2018-11-06 ENCOUNTER — Ambulatory Visit: Payer: Self-pay | Admitting: Adult Health Nurse Practitioner

## 2018-11-06 ENCOUNTER — Other Ambulatory Visit: Payer: Self-pay

## 2018-11-06 DIAGNOSIS — J441 Chronic obstructive pulmonary disease with (acute) exacerbation: Secondary | ICD-10-CM

## 2018-11-06 MED ORDER — PREDNISONE 20 MG PO TABS: 40.0000 mg | ORAL_TABLET | Freq: Every day | ORAL | 0 refills | Status: DC

## 2018-11-06 NOTE — Progress Notes (Signed)
  Patient: Monique Edwards Female    DOB: December 04, 1965   53 y.o.   MRN: [id number] Visit Date: 11/06/2018  Today's Provider: Staci Acosta, NP   No chief complaint on file.  Subjective:    HPI  Telephonic visit  States that she is having dyspnea and it seems to be getting worse.  Nonproductive cough.  Using incruse ellipta and albuterol inhaler.  Using Zyrtec nightly.  This has been an ongoing issue since March of this year when she was given a Z-pack and a short burst of steroids.   PPD x 15 year states that she quit a few years ago but occasionally still has a cigarette or two.   Had suspected COVID in March- self quarantined and recovered.     Allergies  Allergen Reactions  . Amlodipine Swelling    Peripheral edema  . Lisinopril Cough   Previous Medications   ALBUTEROL (VENTOLIN HFA) 108 (90 BASE) MCG/ACT INHALER    Inhale 2 puffs into the lungs every 6 (six) hours as needed for wheezing or shortness of breath.   CITALOPRAM (CELEXA) 10 MG TABLET    Take 1 tablet (10 mg total) by mouth daily.   FLUTICASONE (FLONASE) 50 MCG/ACT NASAL SPRAY    Place 2 sprays into both nostrils daily.   LACTULOSE (CHRONULAC) 10 GM/15ML SOLUTION    Take 30 mLs (20 g total) by mouth daily.   LOSARTAN (COZAAR) 25 MG TABLET    Take 1 tablet (25 mg total) by mouth daily.   PANTOPRAZOLE (PROTONIX) 40 MG TABLET    Take 1 tablet (40 mg total) by mouth daily.   TRAZODONE (DESYREL) 100 MG TABLET    Take 1 tablet (100 mg total) by mouth at bedtime.   UMECLIDINIUM BROMIDE (INCRUSE ELLIPTA) 62.5 MCG/INH AEPB    Inhale 1 puff into the lungs daily.    Review of Systems  Constitutional: Negative for fever.  HENT: Positive for congestion and sneezing.   Respiratory: Positive for cough and shortness of breath.     Social History   Tobacco Use  . Smoking status: Current Some Day Smoker    Types: Cigarettes  . Smokeless tobacco: Never Used  . Tobacco comment: occasionally  Substance Use Topics  .  Alcohol use: No    Comment: former occassional   Objective:   LMP 02/28/2014 (Approximate)   Physical Exam  No PE.     Assessment & Plan:      COPD:  Prednisone 40mg  QD x 5 days.  Continue maintenance medications.  Referral to Pulmonology due to frequent exacerbations.       Staci Acosta, NP   [facility]

## 2018-11-11 ENCOUNTER — Telehealth: Payer: Self-pay | Admitting: Internal Medicine

## 2018-11-11 NOTE — Telephone Encounter (Signed)
Called patient for COVID-19 pre-screening for in office visit. ° °Have you recently traveled any where out of the local area in the last 2 weeks? No ° °Have you been in close contact with a person diagnosed with COVID-19 or someone awaiting results within the last 2 weeks? No ° °Do you currently have any of the following symptoms? If so, when did they start? °Cough     Diarrhea   Joint Pain °Fever      Muscle Pain   Red eyes °Shortness of breath   Abdominal pain  Vomiting °Loss of smell    Rash    Sore Throat °Headache    Weakness   Bruising or bleeding ° ° °Okay to proceed with visit 11/13/2018 ° °

## 2018-11-13 ENCOUNTER — Ambulatory Visit (INDEPENDENT_AMBULATORY_CARE_PROVIDER_SITE_OTHER): Payer: Self-pay | Admitting: Internal Medicine

## 2018-11-13 ENCOUNTER — Other Ambulatory Visit: Payer: Self-pay

## 2018-11-13 ENCOUNTER — Encounter: Payer: Self-pay | Admitting: Internal Medicine

## 2018-11-13 VITALS — BP 160/90 | HR 61 | Temp 97.4°F | Ht 62.0 in | Wt 256.6 lb

## 2018-11-13 DIAGNOSIS — J449 Chronic obstructive pulmonary disease, unspecified: Secondary | ICD-10-CM

## 2018-11-13 MED ORDER — FLUTICASONE-SALMETEROL 250-50 MCG/DOSE IN AEPB: 1.0000 | INHALATION_SPRAY | Freq: Two times a day (BID) | RESPIRATORY_TRACT | 3 refills | Status: DC

## 2018-11-13 NOTE — Progress Notes (Signed)
[facility] Consultation      Assessment and Plan:  Dyspnea on exertion, likely multifactorial from COPD, obesity, deconditioning.  -Discussed treatment of COPD, as well as increasing her physical activity, trying to lose weight which I think will help with her breathing.  COPD/chronic bronchitis. - We will continue Incruse, will start her on Advair in addition to this.  Patient currently does not have health insurance I will send the prescription to medication management. - Discussed with patient about possible application for Medicaid to help with coverage.  Not sure that checking a PFT will change management significantly at this time, therefore as she does not have insurance I will hold off on this.  Meds ordered this encounter  Medications  . Fluticasone-Salmeterol (ADVAIR DISKUS) 250-50 MCG/DOSE AEPB    Sig: Inhale 1 puff into the lungs 2 (two) times daily. 1 puff twice daiy. Rinse mouth after use.    Dispense:  180 each    Refill:  3       Date: 11/13/2018  MRN# [id number] Monique Edwards 1966/04/26    Monique Edwards is a 53 y.o. old female seen in consultation for chief complaint of:    Chief Complaint  Patient presents with  . Consult    Monique Doles-Johnson NP- COPD- c/o SOB, heaviness in the chest, productive cough..using rescue inhale 3x a week     HPI:  Monique Edwards is a 53 y.o. old female, she has been having trouble breathing which has been present for about a year, and has been progressive over the past year.  She had an episode of bronchitis that started it.  She has been diagnosed with COPD. She is taking incruse once daily but does not feel that it helped. She takes ventolin about 3 times per week but does not help.  She has been coughing a bit, no hemoptysis.  She is not a smoker now, she last smoked about 5 months ago. Her lives with 4 family members who smoke.  She has reflux, controlled with protonix.  She denies sinus drainage.  No pets at  home.  She has never been tested for allergies, she takes zyrtec once daily for cough and runny nose.   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 She gets winded when she is working and is getting light headed. She does not currently have health insurance.   She got a course of prednisone from her primary 2 days ago but does not really notice a difference.  Her weight has been going up and down.   **Chest x-ray 07/28/2018>> imaging personally viewed, mild hyperinflation, mild bibasilar atelectasis, obesity.  Lungs are otherwise unremarkable.  PMHX:   Past Medical History:  Diagnosis Date  . COPD (chronic obstructive pulmonary disease) ([place])   . Fatty liver   . GERD (gastroesophageal reflux disease)   . Hypertension    Surgical Hx:  Past Surgical History:  Procedure Laterality Date  . CHOLECYSTECTOMY    . ESOPHAGOGASTRODUODENOSCOPY (EGD) WITH PROPOFOL N/A 06/06/2018   Procedure: ESOPHAGOGASTRODUODENOSCOPY (EGD) WITH PROPOFOL;  Surgeon: Jonathon Bellows, MD;  Location: [facility] ENDOSCOPY;  Service: Gastroenterology;  Laterality: N/A;   Family Hx:  Family History  Problem Relation Age of Onset  . Hypertension Mother   . Stroke Mother   . Diabetes Father   . Arthritis Sister   . Thyroid disease Sister   . Hypertension Brother   . Hypertension Brother    Social Hx:   Social  History   Tobacco Use  . Smoking status: Former Smoker    Packs/day: 1.00    Types: Cigarettes    Start date: 11/04/1980    Quit date: 06/07/2018    Years since quitting: 0.4  . Smokeless tobacco: Never Used  . Tobacco comment: occasionally  Substance Use Topics  . Alcohol use: No    Comment: former occassional  . Drug use: Yes    Types: Cocaine   Medication:    Current Outpatient Medications:  .  albuterol (VENTOLIN HFA) 108 (90 Base) MCG/ACT inhaler, Inhale 2 puffs into the lungs every 6 (six) hours as needed for wheezing or shortness of breath., Disp: 1 Inhaler, Rfl: 1 .   cetirizine (ZYRTEC) 10 MG tablet, Take 10 mg by mouth daily., Disp: , Rfl:  .  fluticasone (FLONASE) 50 MCG/ACT nasal spray, Place 2 sprays into both nostrils daily., Disp: 16 g, Rfl: 2 .  lactulose (CHRONULAC) 10 GM/15ML solution, Take 30 mLs (20 g total) by mouth daily., Disp: 236 mL, Rfl: 1 .  losartan (COZAAR) 25 MG tablet, Take 1 tablet (25 mg total) by mouth daily., Disp: 30 tablet, Rfl: 1 .  pantoprazole (PROTONIX) 40 MG tablet, Take 1 tablet (40 mg total) by mouth daily., Disp: 30 tablet, Rfl: 1 .  predniSONE (DELTASONE) 20 MG tablet, Take 2 tablets (40 mg total) by mouth daily with breakfast., Disp: 10 tablet, Rfl: 0 .  traZODone (DESYREL) 100 MG tablet, Take 1 tablet (100 mg total) by mouth at bedtime., Disp: 30 tablet, Rfl: 1 .  umeclidinium bromide (INCRUSE ELLIPTA) 62.5 MCG/INH AEPB, Inhale 1 puff into the lungs daily., Disp: 1 each, Rfl: 1 .  citalopram (CELEXA) 10 MG tablet, Take 1 tablet (10 mg total) by mouth daily. (Patient not taking: Reported on 11/13/2018), Disp: 30 tablet, Rfl: 1   Allergies:  Amlodipine and Lisinopril  Review of Systems: Gen:  Denies  fever, sweats, chills HEENT: Denies blurred vision, double vision. bleeds, sore throat Cvc:  No dizziness, chest pain. Resp:   Denies cough or sputum production, shortness of breath Gi: Denies swallowing difficulty, stomach pain. Gu:  Denies bladder incontinence, burning urine Ext:   No Joint pain, stiffness. Skin: No skin rash,  hives  Endoc:  No polyuria, polydipsia. Psych: No depression, insomnia. Other:  All other systems were reviewed with the patient and were negative other that what is mentioned in the HPI.   Physical Examination:   VS: BP (!) 160/90 (BP Location: Left Arm, Cuff Size: Normal)   Pulse 61   Temp (!) 97.4 F (36.3 C) (Skin)   Ht 5\' 2"  (1.575 m)   Wt 256 lb 9.6 oz (116.4 kg)   LMP 02/28/2014 (Approximate)   SpO2 97%   BMI 46.93 kg/m   General Appearance: No distress  Neuro:without focal  findings,  speech normal,  HEENT: PERRLA, EOM intact.   Pulmonary: normal breath sounds, No wheezing.  CardiovascularNormal S1,S2.  No m/r/g.   Abdomen: Benign, Soft, non-tender. Renal:  No costovertebral tenderness  GU:  No performed at this time. Endoc: No evident thyromegaly, no signs of acromegaly. Skin:   warm, no rashes, no ecchymosis  Extremities: normal, no cyanosis, clubbing.  Other findings:    LABORATORY PANEL:   CBC No results for input(s): WBC, HGB, HCT, PLT in the last 168 hours. ------------------------------------------------------------------------------------------------------------------  Chemistries  No results for input(s): NA, K, CL, CO2, GLUCOSE, BUN, CREATININE, CALCIUM, MG, AST, ALT, ALKPHOS, BILITOT in the last 168 hours.  Invalid  input(s): GFRCGP ------------------------------------------------------------------------------------------------------------------  Cardiac Enzymes No results for input(s): TROPONINI in the last 168 hours. ------------------------------------------------------------  RADIOLOGY:  No results found.     Thank  you for the consultation and for allowing [facility] to assist in the care of your patient. Our recommendations are noted above.  Please contact us if we can be of further service.   Wells Guileseep Kayon Dozier, M.D., F.C.C.P.  Board Certified in Internal Medicine, Pulmonary Medicine, Critical Care Medicine, and Sleep Medicine.  [facility] Office Number: [phone]   11/13/2018

## 2018-11-13 NOTE — Patient Instructions (Signed)
Continue not to smoke.   Will start a new inhaler (advair), continue your other inhalers.   Try to increase your activity level, and lose weight which will help with your breathing.

## 2018-11-29 IMAGING — CR DG CHEST 2V
1 series · 2 of 2 positions shown · non-contrast
Comparison: 11/19/2017.

CLINICAL DATA: Productive cough and congestion for 2 days. History
of smoking and hypertension.

EXAM:
CHEST - 2 VIEW

[Series 1: dg chest 2 view · 0.14mm/px · 2 of 2 slices shown]
[im 1/2]
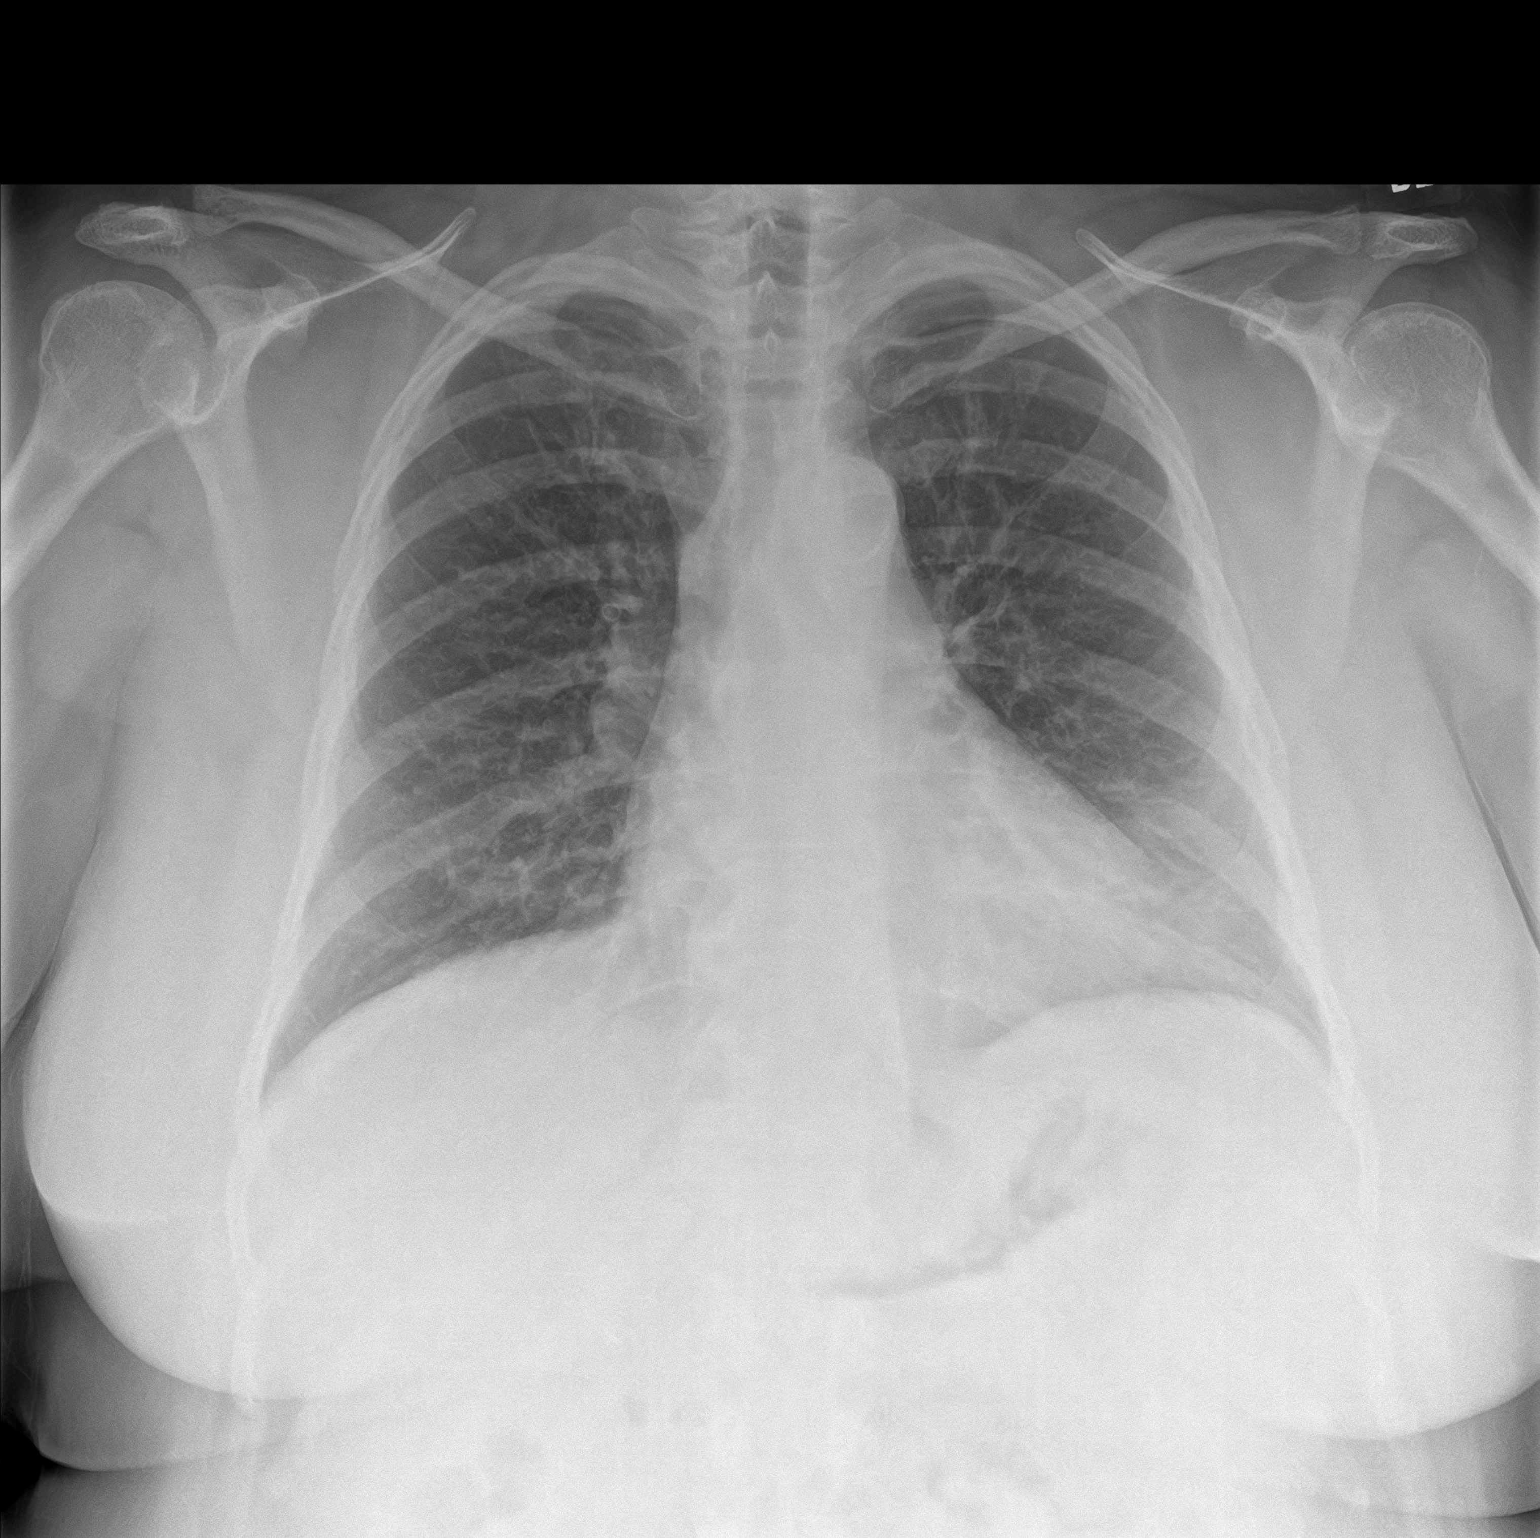
[im 2/2]
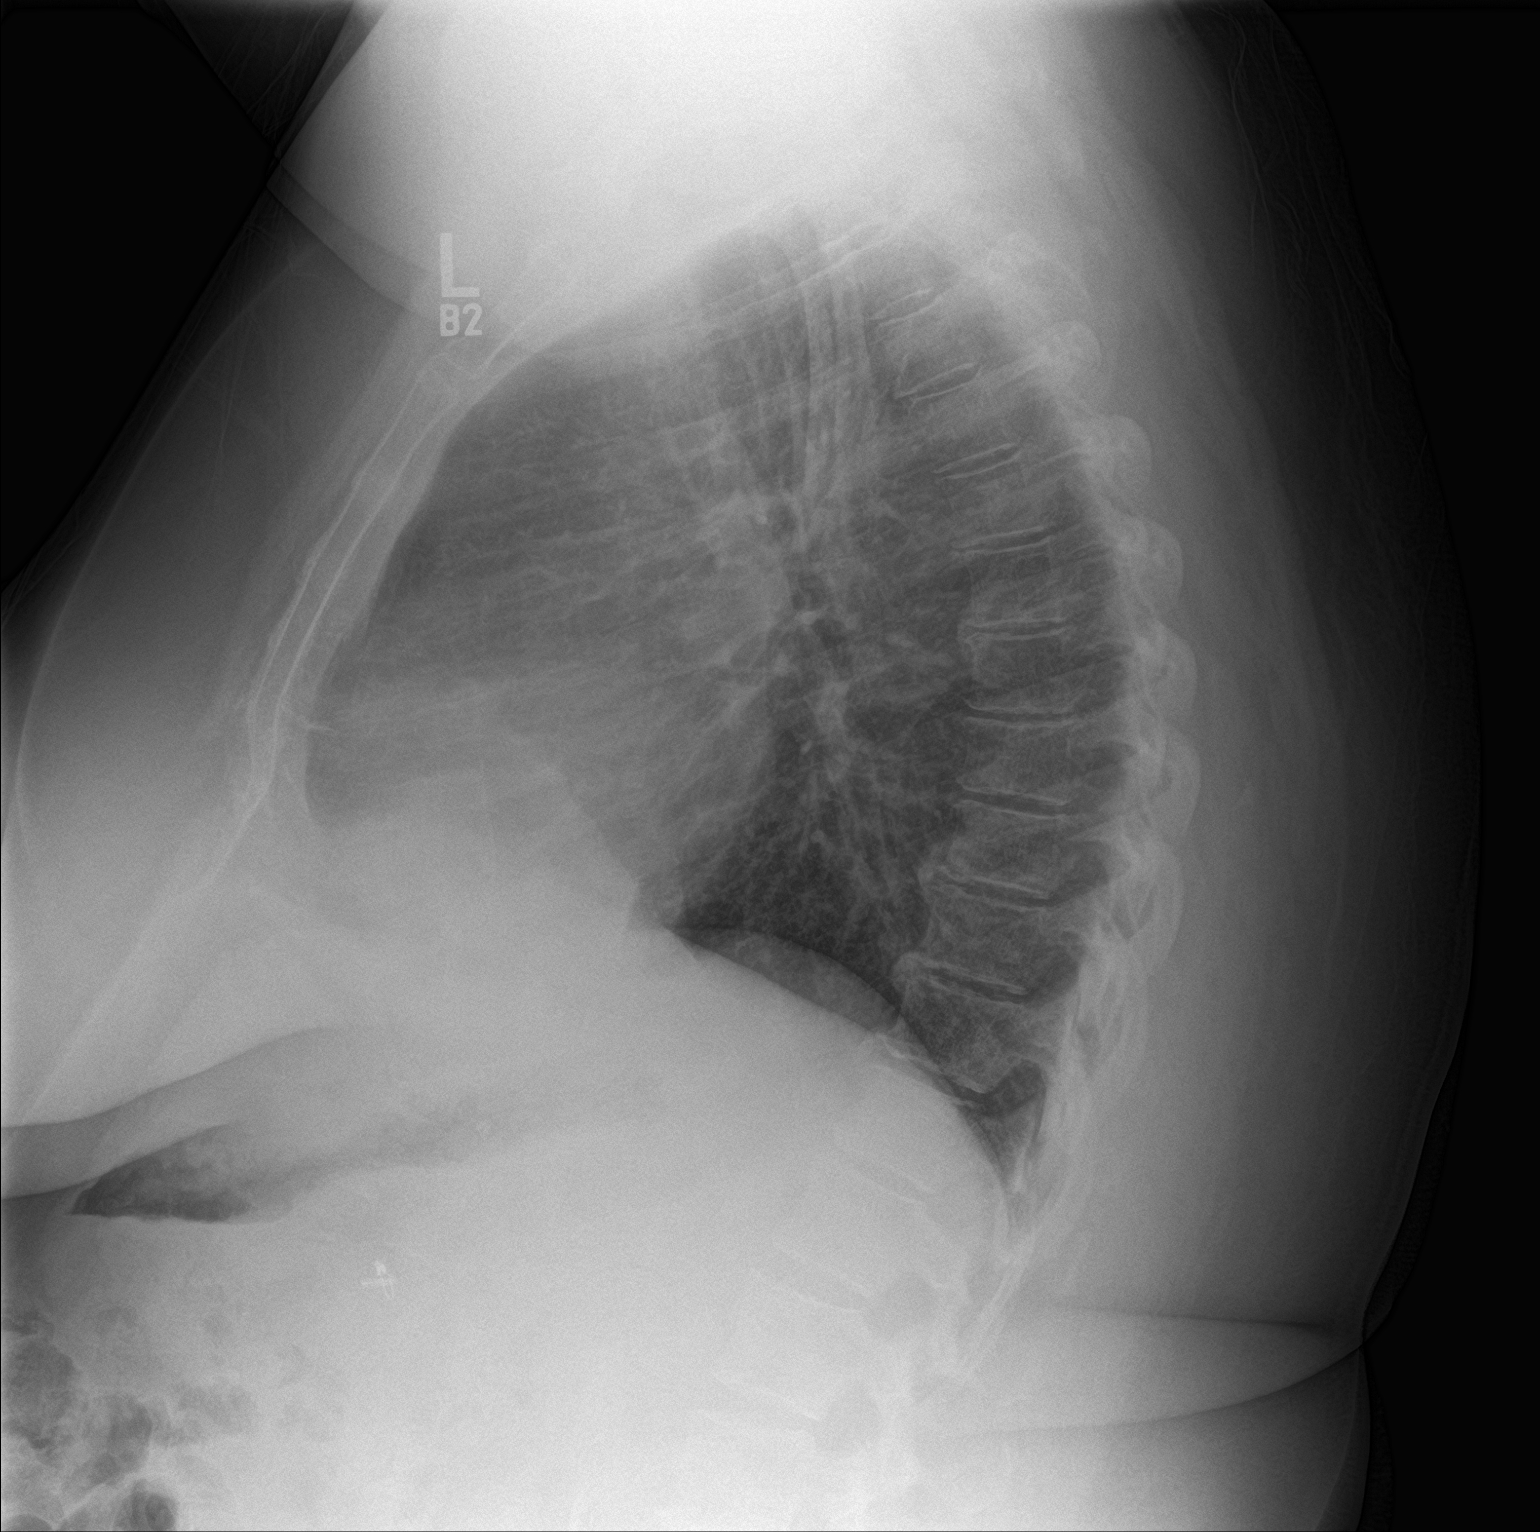

[2 of 2 positions shown; findings below may reference images not displayed]

FINDINGS: The heart size and mediastinal contours are stable. There is aortic
atherosclerosis. Mild chronic central airway thickening is present.
There is no edema, confluent airspace opacity, pleural effusion or
pneumothorax. Mild thoracic spine degenerative changes are stable.
IMPRESSION: Stable chest with mild chronic central airway thickening. No acute
cardiopulmonary process.

## 2018-12-04 ENCOUNTER — Ambulatory Visit: Payer: Self-pay

## 2018-12-10 ENCOUNTER — Ambulatory Visit: Payer: Self-pay | Admitting: Gerontology

## 2018-12-10 ENCOUNTER — Encounter: Payer: Self-pay | Admitting: Gerontology

## 2018-12-10 ENCOUNTER — Other Ambulatory Visit: Payer: Self-pay

## 2018-12-10 VITALS — BP 129/81 | HR 62 | Ht 63.0 in | Wt 240.0 lb

## 2018-12-10 DIAGNOSIS — R200 Anesthesia of skin: Secondary | ICD-10-CM

## 2018-12-10 DIAGNOSIS — I10 Essential (primary) hypertension: Secondary | ICD-10-CM

## 2018-12-10 DIAGNOSIS — K219 Gastro-esophageal reflux disease without esophagitis: Secondary | ICD-10-CM

## 2018-12-10 DIAGNOSIS — Z0000 Encounter for general adult medical examination without abnormal findings: Secondary | ICD-10-CM

## 2018-12-10 MED ORDER — OMEPRAZOLE 20 MG PO CPDR: 20.0000 mg | DELAYED_RELEASE_CAPSULE | Freq: Every day | ORAL | 0 refills | Status: DC

## 2018-12-10 MED ORDER — LACTULOSE 10 GM/15ML PO SOLN: 10.0000 g | Freq: Two times a day (BID) | ORAL | 5 refills | Status: AC

## 2018-12-10 NOTE — Patient Instructions (Signed)

## 2018-12-10 NOTE — Progress Notes (Signed)
Established Patient Office Visit  Subjective:  Patient ID: Monique Edwards, female    DOB: Feb 24, 1966  Age: 53 y.o. MRN: [id number]  CC:  Chief Complaint  Patient presents with  . Follow-up    high blood pressure    HPI Monique Edwards presents for complaint of elevated blood pressure. She was evaluated for recurrent COPD by the Pulmonologist Dr Nicholos Johnsamachandran on 11/13/18. Her blood pressure during visit was 160/90. She was advised to continue Incruse, Advair was ordered, she needs to increase physical activity and weight loss regimen. She reports that her breathing has improved, and she reports that she misses her second dose of ADVAIR . She denies cough, sputum production and shortness of breath. She reports adhering to all her medication and doesn't check blood pressure at home because she lost the cuff provided by the clinic. She states that she is complying with DASH diet, exercising as tolerated and has lost 16 pounds in less than 1 month. She c/o experiencing intermittent numbness to left lower leg that has being going on for over 5 months. She states that it resolves within 2-3 minutes. Her platelet has being low, she denies bruises, active bleeding, hematuria and hematochezia. She denies chest pain, palpitation, fever, chills and no further concerns.  Past Medical History:  Diagnosis Date  . COPD (chronic obstructive pulmonary disease) (HCC)   . Fatty liver   . GERD (gastroesophageal reflux disease)   . Hypertension     Past Surgical History:  Procedure Laterality Date  . CHOLECYSTECTOMY    . ESOPHAGOGASTRODUODENOSCOPY (EGD) WITH PROPOFOL N/A 06/06/2018   Procedure: ESOPHAGOGASTRODUODENOSCOPY (EGD) WITH PROPOFOL;  Surgeon: Wyline MoodAnna, Kiran, MD;  [facility] ENDOSCOPY;  Service: Gastroenterology;  Laterality: N/A;    Family History  Problem Relation Age of Onset  . Hypertension Mother   . Stroke Mother   . Diabetes Father   . Arthritis Sister   . Thyroid disease Sister   .  Hypertension Brother   . Hypertension Brother     Social History   Socioeconomic History  . Marital status: Widowed    Spouse name: Not on file  . Number of children: Not on file  . Years of education: Not on file  . Highest education level: Not on file  Occupational History  . Not on file  Social Needs  . Financial resource strain: Not on file  . Food insecurity    Worry: Not on file    Inability: Not on file  . Transportation needs    Medical: Not on file    Non-medical: Not on file  Tobacco Use  . Smoking status: Former Smoker    Packs/day: 1.00    Types: Cigarettes    Start date: 11/04/1980    Quit date: 06/07/2018    Years since quitting: 0.5  . Smokeless tobacco: Never Used  . Tobacco comment: occasionally  Substance and Sexual Activity  . Alcohol use: No    Comment: former occassional  . Drug use: Yes    Types: Cocaine  . Sexual activity: Not on file  Lifestyle  . Physical activity    Days per week: Not on file    Minutes per session: Not on file  . Stress: Not on file  Relationships  . Social Musicianconnections    Talks on phone: Not on file    Gets together: Not on file    Attends religious service: Not on file    Active member of club or organization: Not on file  Attends meetings of clubs or organizations: Not on file    Relationship status: Not on file  . Intimate partner violence    Fear of current or ex partner: Not on file    Emotionally abused: Not on file    Physically abused: Not on file    Forced sexual activity: Not on file  Other Topics Concern  . Not on file  Social History Narrative  . Not on file    Outpatient Medications Prior to Visit  Medication Sig Dispense Refill  . albuterol (VENTOLIN HFA) 108 (90 Base) MCG/ACT inhaler Inhale 2 puffs into the lungs every 6 (six) hours as needed for wheezing or shortness of breath. 1 Inhaler 1  . cetirizine (ZYRTEC) 10 MG tablet Take 10 mg by mouth daily.    . ferrous sulfate 325 (65 FE) MG EC  tablet Take 325 mg by mouth daily with breakfast.    . fluticasone (FLONASE) 50 MCG/ACT nasal spray Place 2 sprays into both nostrils daily. 16 g 2  . Fluticasone-Salmeterol (ADVAIR DISKUS) 250-50 MCG/DOSE AEPB Inhale 1 puff into the lungs 2 (two) times daily. 1 puff twice daiy. Rinse mouth after use. 180 each 3  . lactulose (CHRONULAC) 10 GM/15ML solution Take 30 mLs (20 g total) by mouth daily. 236 mL 1  . losartan (COZAAR) 25 MG tablet Take 1 tablet (25 mg total) by mouth daily. 30 tablet 1  . umeclidinium bromide (INCRUSE ELLIPTA) 62.5 MCG/INH AEPB Inhale 1 puff into the lungs daily. 1 each 1  . omeprazole (PRILOSEC) 20 MG capsule Take 20 mg by mouth daily.    . citalopram (CELEXA) 10 MG tablet Take 1 tablet (10 mg total) by mouth daily. (Patient not taking: Reported on 11/13/2018) 30 tablet 1  . pantoprazole (PROTONIX) 40 MG tablet Take 1 tablet (40 mg total) by mouth daily. 30 tablet 1  . predniSONE (DELTASONE) 20 MG tablet Take 2 tablets (40 mg total) by mouth daily with breakfast. (Patient not taking: Reported on 12/10/2018) 10 tablet 0  . traZODone (DESYREL) 100 MG tablet Take 1 tablet (100 mg total) by mouth at bedtime. (Patient not taking: Reported on 12/10/2018) 30 tablet 1   No facility-administered medications prior to visit.     Allergies  Allergen Reactions  . Amlodipine Swelling    Peripheral edema  . Lisinopril Cough    ROS Review of Systems  Constitutional: Negative.   HENT: Negative.   Respiratory: Negative.   Cardiovascular: Negative.   Endocrine: Negative.   Genitourinary: Negative.   Skin: Negative.   Neurological: Positive for numbness (intermittent numbness to left lower leg ).  Psychiatric/Behavioral: Negative.       Objective:    Physical Exam  Constitutional: She is oriented to person, place, and time. She appears well-developed and well-nourished.  HENT:  Head: Normocephalic and atraumatic.  Eyes: Pupils are equal, round, and reactive to light.  Neck:  Normal range of motion.  Cardiovascular: Normal rate and regular rhythm.  Pulmonary/Chest: Effort normal and breath sounds normal.  Abdominal: Soft. Bowel sounds are normal.  Musculoskeletal: Normal range of motion.  Neurological: She is alert and oriented to person, place, and time.  Skin: Skin is warm and dry.  Psychiatric: She has a normal mood and affect. Her behavior is normal. Judgment and thought content normal.    BP 129/81 (BP Location: Right Arm, Patient Position: Sitting)   Pulse 62   Ht 5\' 3"  (1.6 m)   Wt 240 lb (108.9 kg)  LMP 02/28/2014 (Approximate)   SpO2 97%   BMI 42.51 kg/m  Wt Readings from Last 3 Encounters:  12/10/18 240 lb (108.9 kg)  11/13/18 256 lb 9.6 oz (116.4 kg)  10/02/18 260 lb (117.9 kg)     Health Maintenance Due  Topic Date Due  . HIV Screening  03/26/1979  . TETANUS/TDAP  03/26/1983  . COLONOSCOPY  03/25/2014  . INFLUENZA VACCINE  12/06/2018    There are no preventive care reminders to display for this patient.  Lab Results  Component Value Date   TSH 2.130 03/19/2018   Lab Results  Component Value Date   WBC 4.1 10/02/2018   HGB 11.6 (L) 10/02/2018   HCT 35.9 (L) 10/02/2018   MCV 86.1 10/02/2018   PLT 76 (L) 10/02/2018   Lab Results  Component Value Date   NA 141 10/02/2018   K 3.9 10/02/2018   CO2 24 10/02/2018   GLUCOSE 94 10/02/2018   BUN 10 10/02/2018   CREATININE 0.91 10/02/2018   BILITOT 1.1 10/02/2018   ALKPHOS 55 10/02/2018   AST 37 10/02/2018   ALT 34 10/02/2018   PROT 7.7 10/02/2018   ALBUMIN 3.9 10/02/2018   CALCIUM 9.1 10/02/2018   ANIONGAP 10 10/02/2018   Lab Results  Component Value Date   CHOL 150 03/19/2018   Lab Results  Component Value Date   HDL 54 03/19/2018   Lab Results  Component Value Date   LDLCALC 73 03/19/2018   Lab Results  Component Value Date   TRIG 114 03/19/2018   Lab Results  Component Value Date   CHOLHDL 2.8 03/19/2018   Lab Results  Component Value Date   HGBA1C  5.7 (H) 03/19/2018      Assessment & Plan:    1. Essential hypertension - Her blood pressure at 129/81 is within normal limit and will continue on current treatment regimen. She was encouraged to continue on weight loss regimen, low salt diet and exercising as tolerated. She will monitor and record blood pressure and notify clinic if it's > 140/90 consistently.  2. GERD without esophagitis - Controlled and she will continue on current treatment regimen. -Avoid spicy, fatty and fried food -Avoid sodas and sour juices -Avoid heavy meals -Avoid eating 4 hours before bedtime -Elevate head of bed at night - omeprazole (PRILOSEC) 20 MG capsule; Take 1 capsule (20 mg total) by mouth daily.  Dispense: 30 capsule; Refill: 0  3. Numbness - She was advised to notify clinic for worsening symptoms, and will check - B12 and Folate Panel; Future  4. Health care maintenance - Her HgbA1c done on 03/19/2018 was 5.7%, and Platelet was 76 done on 10/02/18, will recheck lab. - HgB A1c; Future - CBC w/Diff; Future    Follow-up: Return in about 2 months (around 02/09/2019), or if symptoms worsen or fail to improve.    Belita Warsame Jerold Coombe, NP

## 2018-12-23 ENCOUNTER — Telehealth: Payer: Self-pay | Admitting: Pharmacy Technician

## 2018-12-23 NOTE — Telephone Encounter (Signed)
Patient still needs to provide 2019 tax return.  Patient understands that if she does not provide the requested information then [facility] will no longer be able to provide medication assistance.  [facility]

## 2019-01-01 ENCOUNTER — Other Ambulatory Visit: Payer: Self-pay

## 2019-01-08 ENCOUNTER — Ambulatory Visit: Payer: Self-pay | Admitting: Gerontology

## 2019-01-20 ENCOUNTER — Telehealth: Payer: Self-pay | Admitting: Pharmacist

## 2019-01-20 NOTE — Telephone Encounter (Signed)
01/20/2019 8:52:56 AM - Advair new med script to [place]  01/20/2019 Faxed script to [facility] for new med--Advair 250/50 Inhale 1 puff into the lungs 2 times a day, rinse mouth and spit after each use.Delos Haring

## 2019-01-28 ENCOUNTER — Telehealth: Payer: Self-pay | Admitting: Pharmacist

## 2019-01-28 NOTE — Telephone Encounter (Signed)
01/28/2019 11:20:03 AM - [facility] Ellipta & Ventolin scripts for refill  01/28/2019 Printed scripts for Incruse Ellipta & Ventolin for Benjamine Mola to sign for refill.Delos Haring

## 2019-01-29 ENCOUNTER — Telehealth: Payer: Self-pay | Admitting: Pharmacist

## 2019-01-29 NOTE — Telephone Encounter (Signed)
01/29/2019 9:01:06 AM - Incruse Ellipta & Ventolin refills  01/29/2019 Faxed scripts to [place] for refills on Incruse Ellipta 62.68mcg Inhale 1 puff into the lungs once a day every day, replaces Spiriva Handihaler, also Ventolin HFA 38mcg Inhale 2 puffs into the lungs every 4 hours as needed for wheezing or shortness of breath.Delos Haring

## 2019-02-12 ENCOUNTER — Other Ambulatory Visit: Payer: Self-pay

## 2019-02-19 ENCOUNTER — Ambulatory Visit: Payer: Self-pay

## 2019-03-11 ENCOUNTER — Other Ambulatory Visit: Payer: Self-pay

## 2019-03-11 DIAGNOSIS — R200 Anesthesia of skin: Secondary | ICD-10-CM

## 2019-03-11 DIAGNOSIS — I10 Essential (primary) hypertension: Secondary | ICD-10-CM

## 2019-03-11 DIAGNOSIS — R7303 Prediabetes: Secondary | ICD-10-CM

## 2019-03-11 DIAGNOSIS — Z0000 Encounter for general adult medical examination without abnormal findings: Secondary | ICD-10-CM

## 2019-03-12 LAB — CBC WITH DIFFERENTIAL/PLATELET
Basophils Absolute: 0 10*3/uL (ref 0.0–0.2)
Basos: 1 %
EOS (ABSOLUTE): 0 10*3/uL (ref 0.0–0.4)
Eos: 1 %
Hematocrit: 41 % (ref 34.0–46.6)
Hemoglobin: 13.9 g/dL (ref 11.1–15.9)
Immature Grans (Abs): 0 10*3/uL (ref 0.0–0.1)
Immature Granulocytes: 0 %
Lymphocytes Absolute: 0.9 10*3/uL (ref 0.7–3.1)
Lymphs: 23 %
MCH: 31.4 pg (ref 26.6–33.0)
MCHC: 33.9 g/dL (ref 31.5–35.7)
MCV: 93 fL (ref 79–97)
Monocytes Absolute: 0.3 10*3/uL (ref 0.1–0.9)
Monocytes: 6 %
Neutrophils Absolute: 2.7 10*3/uL (ref 1.4–7.0)
Neutrophils: 69 %
Platelets: 83 10*3/uL — CL (ref 150–450)
RBC: 4.42 x10E6/uL (ref 3.77–5.28)
RDW: 14.7 % (ref 11.7–15.4)
WBC: 3.9 10*3/uL (ref 3.4–10.8)

## 2019-03-12 LAB — COMPREHENSIVE METABOLIC PANEL
ALT: 43 IU/L — ABNORMAL HIGH (ref 0–32)
AST: 54 IU/L — ABNORMAL HIGH (ref 0–40)
Albumin/Globulin Ratio: 1.7 (ref 1.2–2.2)
Albumin: 4.6 g/dL (ref 3.8–4.9)
Alkaline Phosphatase: 68 IU/L (ref 39–117)
BUN/Creatinine Ratio: 9 (ref 9–23)
BUN: 7 mg/dL (ref 6–24)
Bilirubin Total: 0.9 mg/dL (ref 0.0–1.2)
CO2: 20 mmol/L (ref 20–29)
Calcium: 9.4 mg/dL (ref 8.7–10.2)
Chloride: 105 mmol/L (ref 96–106)
Creatinine, Ser: 0.8 mg/dL (ref 0.57–1.00)
GFR calc Af Amer: 97 mL/min/{1.73_m2} (ref >59)
GFR calc non Af Amer: 84 mL/min/{1.73_m2} (ref >59)
Globulin, Total: 2.7 g/dL (ref 1.5–4.5)
Glucose: 86 mg/dL (ref 65–99)
Potassium: 3.8 mmol/L (ref 3.5–5.2)
Sodium: 143 mmol/L (ref 134–144)
Total Protein: 7.3 g/dL (ref 6.0–8.5)

## 2019-03-12 LAB — LIPID PANEL
Chol/HDL Ratio: 2.2 ratio (ref 0.0–4.4)
Cholesterol, Total: 168 mg/dL (ref 100–199)
HDL: 76 mg/dL (ref >39)
LDL Chol Calc (NIH): 77 mg/dL (ref 0–99)
Triglycerides: 81 mg/dL (ref 0–149)
VLDL Cholesterol Cal: 15 mg/dL (ref 5–40)

## 2019-03-12 LAB — HEMOGLOBIN A1C
Est. average glucose Bld gHb Est-mCnc: 97 mg/dL
Hgb A1c MFr Bld: 5 % (ref 4.8–5.6)

## 2019-03-12 LAB — B12 AND FOLATE PANEL
Folate: 5.5 ng/mL (ref >3.0)
Vitamin B-12: 271 pg/mL (ref 232–1245)

## 2019-03-17 ENCOUNTER — Ambulatory Visit: Payer: Self-pay | Admitting: Gerontology

## 2019-03-17 NOTE — Progress Notes (Unsigned)
Established Patient Office Visit  Subjective:  Patient ID: Monique Edwards, female    DOB: [birth year]  Age: 53 y.o. MRN: [id number]  CC: No chief complaint on file.   HPI Monique Edwards presents for ***  Past Medical History:  Diagnosis Date  . COPD (chronic obstructive pulmonary disease) (HCC)   . Fatty liver   . GERD (gastroesophageal reflux disease)   . Hypertension     Past Surgical History:  Procedure Laterality Date  . CHOLECYSTECTOMY    . ESOPHAGOGASTRODUODENOSCOPY (EGD) WITH PROPOFOL N/A 06/06/2018   Procedure: ESOPHAGOGASTRODUODENOSCOPY (EGD) WITH PROPOFOL;  Surgeon: Wyline Mood, MD;  Location: [facility] ENDOSCOPY;  Service: Gastroenterology;  Laterality: N/A;    Family History  Problem Relation Age of Onset  . Hypertension Mother   . Stroke Mother   . Diabetes Father   . Arthritis Sister   . Thyroid disease Sister   . Hypertension Brother   . Hypertension Brother     Social History   Socioeconomic History  . Marital status: Widowed    Spouse name: Not on file  . Number of children: Not on file  . Years of education: Not on file  . Highest education level: Not on file  Occupational History  . Not on file  Social Needs  . Financial resource strain: Not on file  . Food insecurity    Worry: Not on file    Inability: Not on file  . Transportation needs    Medical: Not on file    Non-medical: Not on file  Tobacco Use  . Smoking status: Former Smoker    Packs/day: 1.00    Types: Cigarettes    Start date: 11/04/1980    Quit date: 06/07/2018    Years since quitting: 0.7  . Smokeless tobacco: Never Used  . Tobacco comment: occasionally  Substance and Sexual Activity  . Alcohol use: No    Comment: former occassional  . Drug use: Yes    Types: Cocaine  . Sexual activity: Not on file  Lifestyle  . Physical activity    Days per week: Not on file    Minutes per session: Not on file  . Stress: Not on file  Relationships  . Social Musician on phone:  Not on file    Gets together: Not on file    Attends religious service: Not on file    Active member of club or organization: Not on file    Attends meetings of clubs or organizations: Not on file    Relationship status: Not on file  . Intimate partner violence    Fear of current or ex partner: Not on file    Emotionally abused: Not on file    Physically abused: Not on file    Forced sexual activity: Not on file  Other Topics Concern  . Not on file  Social History Narrative  . Not on file    Outpatient Medications Prior to Visit  Medication Sig Dispense Refill  . albuterol (VENTOLIN HFA) 108 (90 Base) MCG/ACT inhaler Inhale 2 puffs into the lungs every 6 (six) hours as needed for wheezing or shortness of breath. 1 Inhaler 1  . cetirizine (ZYRTEC) 10 MG tablet Take 10 mg by mouth daily.    . ferrous sulfate 325 (65 FE) MG EC tablet Take 325 mg by mouth daily with breakfast.    . fluticasone (FLONASE) 50 MCG/ACT nasal spray Place 2 sprays into both nostrils daily. 16 g 2  .  Fluticasone-Salmeterol (ADVAIR DISKUS) 250-50 MCG/DOSE AEPB Inhale 1 puff into the lungs 2 (two) times daily. 1 puff twice daiy. Rinse mouth after use. 180 each 3  . lactulose (CHRONULAC) 10 GM/15ML solution Take 15 mLs (10 g total) by mouth 2 (two) times daily. 900 mL 5  . losartan (COZAAR) 25 MG tablet Take 1 tablet (25 mg total) by mouth daily. 30 tablet 1  . omeprazole (PRILOSEC) 20 MG capsule Take 1 capsule (20 mg total) by mouth daily. 30 capsule 0  . umeclidinium bromide (INCRUSE ELLIPTA) 62.5 MCG/INH AEPB Inhale 1 puff into the lungs daily. 1 each 1   No facility-administered medications prior to visit.     Allergies  Allergen Reactions  . Amlodipine Swelling    Peripheral edema  . Lisinopril Cough    ROS Review of Systems    Objective:    Physical Exam  LMP 02/28/2014 (Approximate)  Wt Readings from Last 3 Encounters:  12/10/18 240 lb (108.9 kg)  11/13/18 256 lb 9.6 oz (116.4 kg)  10/02/18  260 lb (117.9 kg)     Health Maintenance Due  Topic Date Due  . HIV Screening  03/26/1979  . TETANUS/TDAP  03/26/1983  . COLONOSCOPY  03/25/2014    There are no preventive care reminders to display for this patient.  Lab Results  Component Value Date   TSH 2.130 03/19/2018   Lab Results  Component Value Date   WBC 3.9 03/11/2019   HGB 13.9 03/11/2019   HCT 41.0 03/11/2019   MCV 93 03/11/2019   PLT 83 (LL) 03/11/2019   Lab Results  Component Value Date   NA 143 03/11/2019   K 3.8 03/11/2019   CO2 20 03/11/2019   GLUCOSE 86 03/11/2019   BUN 7 03/11/2019   CREATININE 0.80 03/11/2019   BILITOT 0.9 03/11/2019   ALKPHOS 68 03/11/2019   AST 54 (H) 03/11/2019   ALT 43 (H) 03/11/2019   PROT 7.3 03/11/2019   ALBUMIN 4.6 03/11/2019   CALCIUM 9.4 03/11/2019   ANIONGAP 10 10/02/2018   Lab Results  Component Value Date   CHOL 168 03/11/2019   Lab Results  Component Value Date   HDL 76 03/11/2019   Lab Results  Component Value Date   LDLCALC 77 03/11/2019   Lab Results  Component Value Date   TRIG 81 03/11/2019   Lab Results  Component Value Date   CHOLHDL 2.2 03/11/2019   Lab Results  Component Value Date   HGBA1C 5.0 03/11/2019      Assessment & Plan:   Problem List Items Addressed This Visit    None      No orders of the defined types were placed in this encounter.   Follow-up: No follow-ups on file.    Felecia Stanfill Jerold Coombe, NP

## 2019-03-18 ENCOUNTER — Ambulatory Visit: Payer: Self-pay | Admitting: Gerontology

## 2019-03-18 ENCOUNTER — Other Ambulatory Visit: Payer: Self-pay

## 2019-03-18 ENCOUNTER — Encounter: Payer: Self-pay | Admitting: Gerontology

## 2019-03-18 VITALS — BP 125/81 | HR 72 | Temp 96.4°F | Ht 63.0 in | Wt 199.5 lb

## 2019-03-18 DIAGNOSIS — D696 Thrombocytopenia, unspecified: Secondary | ICD-10-CM

## 2019-03-18 DIAGNOSIS — K760 Fatty (change of) liver, not elsewhere classified: Secondary | ICD-10-CM

## 2019-03-18 DIAGNOSIS — I10 Essential (primary) hypertension: Secondary | ICD-10-CM

## 2019-03-18 DIAGNOSIS — G8929 Other chronic pain: Secondary | ICD-10-CM

## 2019-03-18 DIAGNOSIS — K219 Gastro-esophageal reflux disease without esophagitis: Secondary | ICD-10-CM

## 2019-03-18 DIAGNOSIS — Z8709 Personal history of other diseases of the respiratory system: Secondary | ICD-10-CM

## 2019-03-18 DIAGNOSIS — R300 Dysuria: Secondary | ICD-10-CM | POA: Insufficient documentation

## 2019-03-18 MED ORDER — OMEPRAZOLE 20 MG PO CPDR: 20.0000 mg | DELAYED_RELEASE_CAPSULE | Freq: Every day | ORAL | 4 refills | Status: DC

## 2019-03-18 MED ORDER — INCRUSE ELLIPTA 62.5 MCG/INH IN AEPB: 1.0000 | INHALATION_SPRAY | Freq: Every day | RESPIRATORY_TRACT | 3 refills | Status: DC

## 2019-03-18 MED ORDER — LOSARTAN POTASSIUM 25 MG PO TABS: 25.0000 mg | ORAL_TABLET | Freq: Every day | ORAL | 4 refills | Status: DC

## 2019-03-18 MED ORDER — ALBUTEROL SULFATE HFA 108 (90 BASE) MCG/ACT IN AERS: 2.0000 | INHALATION_SPRAY | Freq: Four times a day (QID) | RESPIRATORY_TRACT | 2 refills | Status: DC | PRN

## 2019-03-18 MED ORDER — BLOOD PRESSURE MONITOR KIT: 1.0000 | PACK | Freq: Every day | 0 refills | Status: DC

## 2019-03-18 MED ORDER — DICLOFENAC SODIUM 1 % EX GEL: 4.0000 g | Freq: Four times a day (QID) | CUTANEOUS | 0 refills | Status: DC

## 2019-03-18 NOTE — Progress Notes (Signed)
Established Patient Office Visit  Subjective:  Patient ID: Monique Edwards, female    DOB: Sep 20, 1965  Age: 53 y.o. MRN: [id number]  CC:  Chief Complaint  Patient presents with  . Hypertension    HPI Monique Edwards presents for follow up of hypertension, thrombocytopenia, lab review and medication refill. She states that she's compliant with her medications, but not checking his blood pressure because her machine is non functional. She lost 57 pounds in 4 months, which was planned. She has a history of Fatty liver and her liver enzymes done on 03/11/2019, AST was 54 and ALT was 43, she denies jaundice and right upper quadrant pain. She states that she takes Lactulose once in a while and drinks 40oz beer once a week. She's being having Thrombocytopenia for more than three years. Her Platelets done on 03/11/2019 improved from 76 to 83, she endorses mild bruises to bilateral arms , denies hematuria and hematochezia.  She also complain of experiencing intermittent non radiating dull pain to right knees that has being going on for more than 2 years. She states that pain increases in intensity from 2-5 and taking Ibuprofen relieves symptoms. Currently, she reports that she experiencing dysuria, urinary frequency and urgency, but denies flank pain, pelvic pain, fever and chills. She states that she's doing well and offers no further complaint. Past Medical History:  Diagnosis Date  . COPD (chronic obstructive pulmonary disease) ([place])   . Fatty liver   . GERD (gastroesophageal reflux disease)   . Hypertension     Past Surgical History:  Procedure Laterality Date  . CHOLECYSTECTOMY    . ESOPHAGOGASTRODUODENOSCOPY (EGD) WITH PROPOFOL N/A 06/06/2018   Procedure: ESOPHAGOGASTRODUODENOSCOPY (EGD) WITH PROPOFOL;  Surgeon: Jonathon Bellows, MD;  Location: [facility] ENDOSCOPY;  Service: Gastroenterology;  Laterality: N/A;    Family History  Problem Relation Age of Onset  . Hypertension Mother   . Stroke Mother   .  Diabetes Father   . Arthritis Sister   . Thyroid disease Sister   . Hypertension Brother   . Hypertension Brother     Social History   Socioeconomic History  . Marital status: Widowed    Spouse name: Not on file  . Number of children: Not on file  . Years of education: Not on file  . Highest education level: Not on file  Occupational History  . Not on file  Social Needs  . Financial resource strain: Not on file  . Food insecurity    Worry: Not on file    Inability: Not on file  . Transportation needs    Medical: Not on file    Non-medical: Not on file  Tobacco Use  . Smoking status: Current Some Day Smoker    Packs/day: 1.00    Types: Cigarettes    Start date: 11/04/1980  . Smokeless tobacco: Never Used  . Tobacco comment: occasionally  Substance and Sexual Activity  . Alcohol use: No    Comment: former occassional  . Drug use: Not Currently    Types: Cocaine  . Sexual activity: Not on file  Lifestyle  . Physical activity    Days per week: Not on file    Minutes per session: Not on file  . Stress: Not on file  Relationships  . Social Herbalist on phone: Not on file    Gets together: Not on file    Attends religious service: Not on file    Active member of club or organization: Not on  file    Attends meetings of clubs or organizations: Not on file    Relationship status: Not on file  . Intimate partner violence    Fear of current or ex partner: Not on file    Emotionally abused: Not on file    Physically abused: Not on file    Forced sexual activity: Not on file  Other Topics Concern  . Not on file  Social History Narrative  . Not on file    Outpatient Medications Prior to Visit  Medication Sig Dispense Refill  . cetirizine (ZYRTEC) 10 MG tablet Take 10 mg by mouth daily.    . ferrous sulfate 325 (65 FE) MG EC tablet Take 325 mg by mouth daily with breakfast.    . Fluticasone-Salmeterol (ADVAIR DISKUS) 250-50 MCG/DOSE AEPB Inhale 1 puff into  the lungs 2 (two) times daily. 1 puff twice daiy. Rinse mouth after use. 180 each 3  . lactulose (CHRONULAC) 10 GM/15ML solution Take 15 mLs (10 g total) by mouth 2 (two) times daily. 900 mL 5  . albuterol (VENTOLIN HFA) 108 (90 Base) MCG/ACT inhaler Inhale 2 puffs into the lungs every 6 (six) hours as needed for wheezing or shortness of breath. 1 Inhaler 1  . losartan (COZAAR) 25 MG tablet Take 1 tablet (25 mg total) by mouth daily. 30 tablet 1  . omeprazole (PRILOSEC) 20 MG capsule Take 1 capsule (20 mg total) by mouth daily. 30 capsule 0  . umeclidinium bromide (INCRUSE ELLIPTA) 62.5 MCG/INH AEPB Inhale 1 puff into the lungs daily. 1 each 1  . fluticasone (FLONASE) 50 MCG/ACT nasal spray Place 2 sprays into both nostrils daily. (Patient not taking: Reported on 03/18/2019) 16 g 2   No facility-administered medications prior to visit.     Allergies  Allergen Reactions  . Amlodipine Swelling    Peripheral edema  . Lisinopril Cough    ROS Review of Systems  Constitutional: Negative.   Respiratory: Negative.   Cardiovascular: Negative.   Genitourinary: Positive for dysuria, frequency and urgency.  Musculoskeletal: Positive for arthralgias.  Skin: Negative.   Neurological: Negative.   Hematological: Bruises/bleeds easily.  Psychiatric/Behavioral: Negative.       Objective:    Physical Exam  Constitutional: She is oriented to person, place, and time. She appears well-developed.  HENT:  Head: Normocephalic and atraumatic.  Cardiovascular: Normal rate and regular rhythm.  Pulmonary/Chest: Effort normal and breath sounds normal.  Abdominal: Soft. Bowel sounds are normal.  Neurological: She is alert and oriented to person, place, and time.  Skin: Skin is warm and dry.  Scattered bruises to bilateral arm.  Psychiatric: She has a normal mood and affect. Her behavior is normal. Judgment and thought content normal.    BP 125/81 (BP Location: Left Arm, Patient Position: Sitting)    Pulse 72   Temp (!) 96.4 F (35.8 C)   Ht '5\' 3"'$  (1.6 m)   Wt 199 lb 8 oz (90.5 kg)   LMP 02/28/2014 (Approximate)   SpO2 97%   BMI 35.34 kg/m  Wt Readings from Last 3 Encounters:  03/18/19 199 lb 8 oz (90.5 kg)  12/10/18 240 lb (108.9 kg)  11/13/18 256 lb 9.6 oz (116.4 kg)   She lost 57 pounds in 4 months, was encouraged to continue on weight loss regimen.  Health Maintenance Due  Topic Date Due  . HIV Screening  03/26/1979  . TETANUS/TDAP  03/26/1983  . COLONOSCOPY  03/25/2014    There are no preventive care reminders  to display for this patient.  Lab Results  Component Value Date   TSH 2.130 03/19/2018   Lab Results  Component Value Date   WBC 3.9 03/11/2019   HGB 13.9 03/11/2019   HCT 41.0 03/11/2019   MCV 93 03/11/2019   PLT 83 (LL) 03/11/2019   Lab Results  Component Value Date   NA 143 03/11/2019   K 3.8 03/11/2019   CO2 20 03/11/2019   GLUCOSE 86 03/11/2019   BUN 7 03/11/2019   CREATININE 0.80 03/11/2019   BILITOT 0.9 03/11/2019   ALKPHOS 68 03/11/2019   AST 54 (H) 03/11/2019   ALT 43 (H) 03/11/2019   PROT 7.3 03/11/2019   ALBUMIN 4.6 03/11/2019   CALCIUM 9.4 03/11/2019   ANIONGAP 10 10/02/2018   Lab Results  Component Value Date   CHOL 168 03/11/2019   Lab Results  Component Value Date   HDL 76 03/11/2019   Lab Results  Component Value Date   LDLCALC 77 03/11/2019   Lab Results  Component Value Date   TRIG 81 03/11/2019   Lab Results  Component Value Date   CHOLHDL 2.2 03/11/2019   Lab Results  Component Value Date   HGBA1C 5.0 03/11/2019      Assessment & Plan:     1. GERD without esophagitis - Acid reflux is controlled, she will continue on current treatment regimen. -Avoid spicy, fatty and fried food -Avoid sodas and sour juices -Avoid heavy meals -Avoid eating 4 hours before bedtime -Elevate head of bed at night - omeprazole (PRILOSEC) 20 MG capsule; Take 1 capsule (20 mg total) by mouth daily.  Dispense: 30  capsule; Refill: 4  2. Essential hypertension - Her blood pressure is controlled, she will continue on current treatment regimen. -Low salt DASH diet -Take medications regularly on time -Exercise regularly as tolerated -Check blood pressure at least once a week at home,record and bring log to office visit. -Goal is less than 140/90 and normal blood pressure is 120/80 - losartan (COZAAR) 25 MG tablet; Take 1 tablet (25 mg total) by mouth daily.  Dispense: 30 tablet; Refill: 4 - Blood Pressure Monitor KIT; 1 kit by Does not apply route daily.  Dispense: 1 kit; Refill: 0  3. Chronic pain of right knee - She was advised not to take Ibuprofen due to Thrombocytopenia, but use gel, she verbalized understanding. - diclofenac Sodium (VOLTAREN) 1 % GEL; Apply 4 g topically 4 (four) times daily.  Dispense: 50 g; Refill: 0  4. Dysuria - Urine sample will be checked. - Urinalysis; Future - UA/M w/rflx Culture, Routine; Future - UA/M w/rflx Culture, Routine - Urinalysis  5. History of COPD - Breathing is stable and she will continue on current treatment regimen. - umeclidinium bromide (INCRUSE ELLIPTA) 62.5 MCG/INH AEPB; Inhale 1 puff into the lungs daily.  Dispense: 1 each; Refill: 3 - albuterol (VENTOLIN HFA) 108 (90 Base) MCG/ACT inhaler; Inhale 2 puffs into the lungs every 6 (six) hours as needed for wheezing or shortness of breath.  Dispense: 18 g; Refill: 2  6. Fatty liver - She was encouraged to take Lactulose as ordered and to abstain from alcohol consumption.   7. Thrombocytopenia ([place]) - She was advised not to take Ibuprofen, but use gel for right knee pain. She was also advised to go to the ED for hematuria, hematochezia or active bleeding.   Follow-up: Return in about 4 months (around 07/16/2019), or if symptoms worsen or fail to improve.    Quianna Avery E  Hattie Perch, NP

## 2019-03-18 NOTE — Patient Instructions (Signed)

## 2019-03-20 ENCOUNTER — Other Ambulatory Visit: Payer: Self-pay

## 2019-03-20 MED ORDER — NITROFURANTOIN MONOHYD MACRO 100 MG PO CAPS: 100.0000 mg | ORAL_CAPSULE | Freq: Two times a day (BID) | ORAL | 0 refills | Status: AC

## 2019-03-21 DIAGNOSIS — D696 Thrombocytopenia, unspecified: Secondary | ICD-10-CM | POA: Insufficient documentation

## 2019-03-21 LAB — UA/M W/RFLX CULTURE, ROUTINE
Bilirubin, UA: NEGATIVE
Glucose, UA: NEGATIVE
Ketones, UA: NEGATIVE
Nitrite, UA: POSITIVE — AB
Protein,UA: NEGATIVE
RBC, UA: NEGATIVE
Specific Gravity, UA: 1.016 (ref 1.005–1.030)
Urobilinogen, Ur: 0.2 mg/dL (ref 0.2–1.0)
pH, UA: 5 (ref 5.0–7.5)

## 2019-03-21 LAB — MICROSCOPIC EXAMINATION: Casts: NONE SEEN /lpf

## 2019-03-21 LAB — URINE CULTURE, REFLEX

## 2019-03-23 ENCOUNTER — Other Ambulatory Visit: Payer: Self-pay

## 2019-04-08 ENCOUNTER — Other Ambulatory Visit: Payer: Self-pay

## 2019-04-08 ENCOUNTER — Ambulatory Visit: Payer: Self-pay | Admitting: Gerontology

## 2019-04-08 ENCOUNTER — Encounter: Payer: Self-pay | Admitting: Gerontology

## 2019-04-08 DIAGNOSIS — R110 Nausea: Secondary | ICD-10-CM | POA: Insufficient documentation

## 2019-04-08 NOTE — Progress Notes (Signed)
Established Patient Office Visit  Subjective:  Patient ID: Monique Edwards, female    DOB: 1966-04-30  Age: 53 y.o. MRN: [id number]  CC:  Chief Complaint  Patient presents with  . Hypertension    HPI Monique Edwards presents for complaint of intermittent nausea in the morning that has been going on for 4 weeks.  She reports that nausea occurs after she drinks a bottle of refrigerated cold water on an empty stomach when she wakes up in the morning.  She states that nausea occurs 3-4 times during the week, and it subsides within an hour.  She denies vomiting, abdominal pain, epigastric pain, and bowel irregularity.  She denies any relieving factor.  She lost 47 pounds in less than 4 months, and she states that she eats fruits and vegetables and her weight loss is planned.  She denies any chest pain ,palpitation, lightheadedness, fever and chills.  She states that she is doing well and offers no further complaints.  Past Medical History:  Diagnosis Date  . COPD (chronic obstructive pulmonary disease) ([place])   . Fatty liver   . GERD (gastroesophageal reflux disease)   . Hypertension     Past Surgical History:  Procedure Laterality Date  . CHOLECYSTECTOMY    . ESOPHAGOGASTRODUODENOSCOPY (EGD) WITH PROPOFOL N/A 06/06/2018   Procedure: ESOPHAGOGASTRODUODENOSCOPY (EGD) WITH PROPOFOL;  Surgeon: Jonathon Bellows, MD;  Location: [facility] ENDOSCOPY;  Service: Gastroenterology;  Laterality: N/A;    Family History  Problem Relation Age of Onset  . Hypertension Mother   . Stroke Mother   . Diabetes Father   . Arthritis Sister   . Thyroid disease Sister   . Hypertension Brother   . Hypertension Brother     Social History   Socioeconomic History  . Marital status: Widowed    Spouse name: Not on file  . Number of children: Not on file  . Years of education: Not on file  . Highest education level: Not on file  Occupational History  . Not on file  Social Needs  . Financial resource strain: Not on file   . Food insecurity    Worry: Not on file    Inability: Not on file  . Transportation needs    Medical: Not on file    Non-medical: Not on file  Tobacco Use  . Smoking status: Current Some Day Smoker    Packs/day: 1.00    Types: Cigarettes    Start date: 11/04/1980  . Smokeless tobacco: Never Used  . Tobacco comment: occasionally  Substance and Sexual Activity  . Alcohol use: No    Comment: former occassional  . Drug use: Not Currently    Types: Cocaine  . Sexual activity: Not on file  Lifestyle  . Physical activity    Days per week: Not on file    Minutes per session: Not on file  . Stress: Not on file  Relationships  . Social Herbalist on phone: Not on file    Gets together: Not on file    Attends religious service: Not on file    Active member of club or organization: Not on file    Attends meetings of clubs or organizations: Not on file    Relationship status: Not on file  . Intimate partner violence    Fear of current or ex partner: Not on file    Emotionally abused: Not on file    Physically abused: Not on file    Forced sexual activity: Not  on file  Other Topics Concern  . Not on file  Social History Narrative  . Not on file    Outpatient Medications Prior to Visit  Medication Sig Dispense Refill  . albuterol (VENTOLIN HFA) 108 (90 Base) MCG/ACT inhaler Inhale 2 puffs into the lungs every 6 (six) hours as needed for wheezing or shortness of breath. 18 g 2  . Blood Pressure Monitor KIT 1 kit by Does not apply route daily. 1 kit 0  . cetirizine (ZYRTEC) 10 MG tablet Take 10 mg by mouth daily.    . diclofenac Sodium (VOLTAREN) 1 % GEL Apply 4 g topically 4 (four) times daily. 50 g 0  . ferrous sulfate 325 (65 FE) MG EC tablet Take 325 mg by mouth daily with breakfast.    . Fluticasone-Salmeterol (ADVAIR DISKUS) 250-50 MCG/DOSE AEPB Inhale 1 puff into the lungs 2 (two) times daily. 1 puff twice daiy. Rinse mouth after use. 180 each 3  . lactulose  (CHRONULAC) 10 GM/15ML solution Take 15 mLs (10 g total) by mouth 2 (two) times daily. 900 mL 5  . losartan (COZAAR) 25 MG tablet Take 1 tablet (25 mg total) by mouth daily. 30 tablet 4  . omeprazole (PRILOSEC) 20 MG capsule Take 1 capsule (20 mg total) by mouth daily. 30 capsule 4  . umeclidinium bromide (INCRUSE ELLIPTA) 62.5 MCG/INH AEPB Inhale 1 puff into the lungs daily. 1 each 3   No facility-administered medications prior to visit.     Allergies  Allergen Reactions  . Amlodipine Swelling    Peripheral edema  . Lisinopril Cough    ROS Review of Systems  Constitutional: Negative.  Negative for appetite change, fatigue and unexpected weight change.  Respiratory: Negative.   Cardiovascular: Negative.   Gastrointestinal: Positive for nausea. Negative for abdominal distention, abdominal pain, constipation, diarrhea and vomiting.  Neurological: Negative.   Psychiatric/Behavioral: Negative.       Objective:    Physical Exam  Constitutional: She is oriented to person, place, and time. She appears well-developed.  HENT:  Head: Normocephalic and atraumatic.  Cardiovascular: Normal rate and regular rhythm.  Pulmonary/Chest: Effort normal and breath sounds normal.  Abdominal: Soft. Bowel sounds are normal. She exhibits no distension and no mass. There is no abdominal tenderness. There is no rebound and no guarding.  Neurological: She is alert and oriented to person, place, and time.  Psychiatric: She has a normal mood and affect. Her behavior is normal. Judgment and thought content normal.    BP 139/86 (BP Location: Left Arm, Patient Position: Sitting)   Pulse 63   Ht 5' 3" (1.6 m)   Wt 193 lb (87.5 kg)   LMP 02/28/2014 (Approximate)   SpO2 96%   BMI 34.19 kg/m  Wt Readings from Last 3 Encounters:  04/08/19 193 lb (87.5 kg)  03/18/19 199 lb 8 oz (90.5 kg)  12/10/18 240 lb (108.9 kg)   She lost 47 pounds in less than 4 months, and was encouraged to continue on her weight  loss regimen.  Health Maintenance Due  Topic Date Due  . HIV Screening  03/26/1979  . TETANUS/TDAP  03/26/1983  . COLONOSCOPY  03/25/2014    There are no preventive care reminders to display for this patient.  Lab Results  Component Value Date   TSH 2.130 03/19/2018   Lab Results  Component Value Date   WBC 3.9 03/11/2019   HGB 13.9 03/11/2019   HCT 41.0 03/11/2019   MCV 93 03/11/2019   PLT  83 (LL) 03/11/2019   Lab Results  Component Value Date   NA 143 03/11/2019   K 3.8 03/11/2019   CO2 20 03/11/2019   GLUCOSE 86 03/11/2019   BUN 7 03/11/2019   CREATININE 0.80 03/11/2019   BILITOT 0.9 03/11/2019   ALKPHOS 68 03/11/2019   AST 54 (H) 03/11/2019   ALT 43 (H) 03/11/2019   PROT 7.3 03/11/2019   ALBUMIN 4.6 03/11/2019   CALCIUM 9.4 03/11/2019   ANIONGAP 10 10/02/2018   Lab Results  Component Value Date   CHOL 168 03/11/2019   Lab Results  Component Value Date   HDL 76 03/11/2019   Lab Results  Component Value Date   LDLCALC 77 03/11/2019   Lab Results  Component Value Date   TRIG 81 03/11/2019   Lab Results  Component Value Date   CHOLHDL 2.2 03/11/2019   Lab Results  Component Value Date   HGBA1C 5.0 03/11/2019      Assessment & Plan:    1. Nausea -She was advised to avoid drinking refrigerated cold water on an empty stomach when she wakes up in the morning. She will also take Omeprazole 30 minutes before breakfast and taking her other medications. - She was advised to drink room temperature or warm water when she wakes up in the morning. -She was advised to notify clinic for worsening symptoms.   Follow-up: Return in about 20 days (around 04/28/2019), or if symptoms worsen or fail to improve.    Chioma Jerold Coombe, NP

## 2019-04-08 NOTE — Patient Instructions (Signed)
Nausea, Adult Nausea is feeling sick to your stomach or feeling that you are about to throw up (vomit). Feeling sick to your stomach is usually not serious, but it may be an early sign of a more serious medical problem. As you feel sicker to your stomach, you may throw up. If you throw up, or if you are not able to drink enough fluids, there is a risk that you may lose too much water in your body (get dehydrated). If you lose too much water in your body, you may:  Feel tired.  Feel thirsty.  Have a dry mouth.  Have cracked lips.  Go pee (urinate) less often. Older adults and people who have other diseases or a weak body defense system (immune system) have a higher risk of losing too much water in the body. The main goals of treating this condition are:  To relieve your nausea.  To ensure your nausea occurs less often.  To prevent throwing up and losing too much fluid. Follow these instructions at home: Watch your symptoms for any changes. Tell your doctor about them. Follow these instructions as told by your doctor. Eating and drinking      Take an ORS (oral rehydration solution). This is a drink that is sold at pharmacies and stores.  Drink clear fluids in small amounts as you are able. These include: ? Water. ? Ice chips. ? Fruit juice that has water added (diluted fruit juice). ? Low-calorie sports drinks.  Eat bland, easy-to-digest foods in small amounts as you are able, such as: ? Bananas. ? Applesauce. ? Rice. ? Low-fat (lean) meats. ? Toast. ? Crackers.  Avoid drinking fluids that have a lot of sugar or caffeine in them. This includes energy drinks, sports drinks, and soda.  Avoid alcohol.  Avoid spicy or fatty foods. General instructions  Take over-the-counter and prescription medicines only as told by your doctor.  Rest at home while you get better.  Drink enough fluid to keep your pee (urine) pale yellow.  Take slow and deep breaths when you feel  sick to your stomach.  Avoid food or things that have strong smells.  Wash your hands often with soap and water. If you cannot use soap and water, use hand sanitizer.  Make sure that all people in your home wash their hands well and often.  Keep all follow-up visits as told by your doctor. This is important. Contact a doctor if:  You feel sicker to your stomach.  You feel sick to your stomach for more than 2 days.  You throw up.  You are not able to drink fluids without throwing up.  You have new symptoms.  You have a fever.  You have a headache.  You have muscle cramps.  You have a rash.  You have pain while peeing.  You feel light-headed or dizzy. Get help right away if:  You have pain in your chest, neck, arm, or jaw.  You feel very weak or you pass out (faint).  You have throw up that is bright red or looks like coffee grounds.  You have bloody or black poop (stools) or poop that looks like tar.  You have a very bad headache, a stiff neck, or both.  You have very bad pain, cramping, or bloating in your belly (abdomen).  You have trouble breathing or you are breathing very quickly.  Your heart is beating very quickly.  Your skin feels cold and clammy.  You feel confused.    You have signs of losing too much water in your body, such as: ? Dark pee, very little pee, or no pee. ? Cracked lips. ? Dry mouth. ? Sunken eyes. ? Sleepiness. ? Weakness. These symptoms may be an emergency. Do not wait to see if the symptoms will go away. Get medical help right away. Call your local emergency services (911 in the [place] Do not drive yourself to the hospital. Summary  Nausea is feeling sick to your stomach or feeling that you are about to throw up (vomit).  If you throw up, or if you are not able to drink enough fluids, there is a risk that you may lose too much water in your body (get dehydrated).  Eat and drink what your doctor tells you. Take  over-the-counter and prescription medicines only as told by your doctor.  Contact a doctor right away if your symptoms get worse or you have new symptoms.  Keep all follow-up visits as told by your doctor. This is important. This information is not intended to replace advice given to you by your health care provider. Make sure you discuss any questions you have with your health care provider. Document Released: 04/12/2011 Document Revised: 10/01/2017 Document Reviewed: 10/01/2017 Elsevier Patient Education  2020 [employer]  

## 2019-04-14 ENCOUNTER — Telehealth: Payer: Self-pay | Admitting: Pharmacist

## 2019-04-14 NOTE — Telephone Encounter (Signed)
04/14/2019 2:57:04 PM - [place] renewal to pt & providers  04/14/2019 Mailing [place] renewal to patient to sign & return, also sending scripts to [facility] for Ventolin HFA & Incruse Ellipta for Benjamine Mola to sign & return, also sending interoffice script for Advair to Dr. Laverle Hobby @ [facility] to sign and return by interoffice.Delos Haring

## 2019-04-20 ENCOUNTER — Telehealth: Payer: Self-pay | Admitting: Pharmacist

## 2019-04-20 NOTE — Telephone Encounter (Signed)
04/20/2019 10:41:31 AM - [place] renewal pending  04/20/2019 I have received the signed scripts back from [facility] for Ventolin HFA & Incruse Ellipta for [name] renewal--I am holding for patien to return to form mailed to her 04/14/2019, also Dr. Pincus Large to return script for Advair.Monique Edwards

## 2019-04-23 IMAGING — CR CHEST - 2 VIEW
1 series · 2 of 2 positions shown · non-contrast
Comparison: 03/04/2018

CLINICAL DATA: Shortness of breath, chest pain

EXAM:
CHEST - 2 VIEW

[Series 1: dg chest 2 view · 0.14mm/px · 2 of 2 slices shown]
[im 1/2]
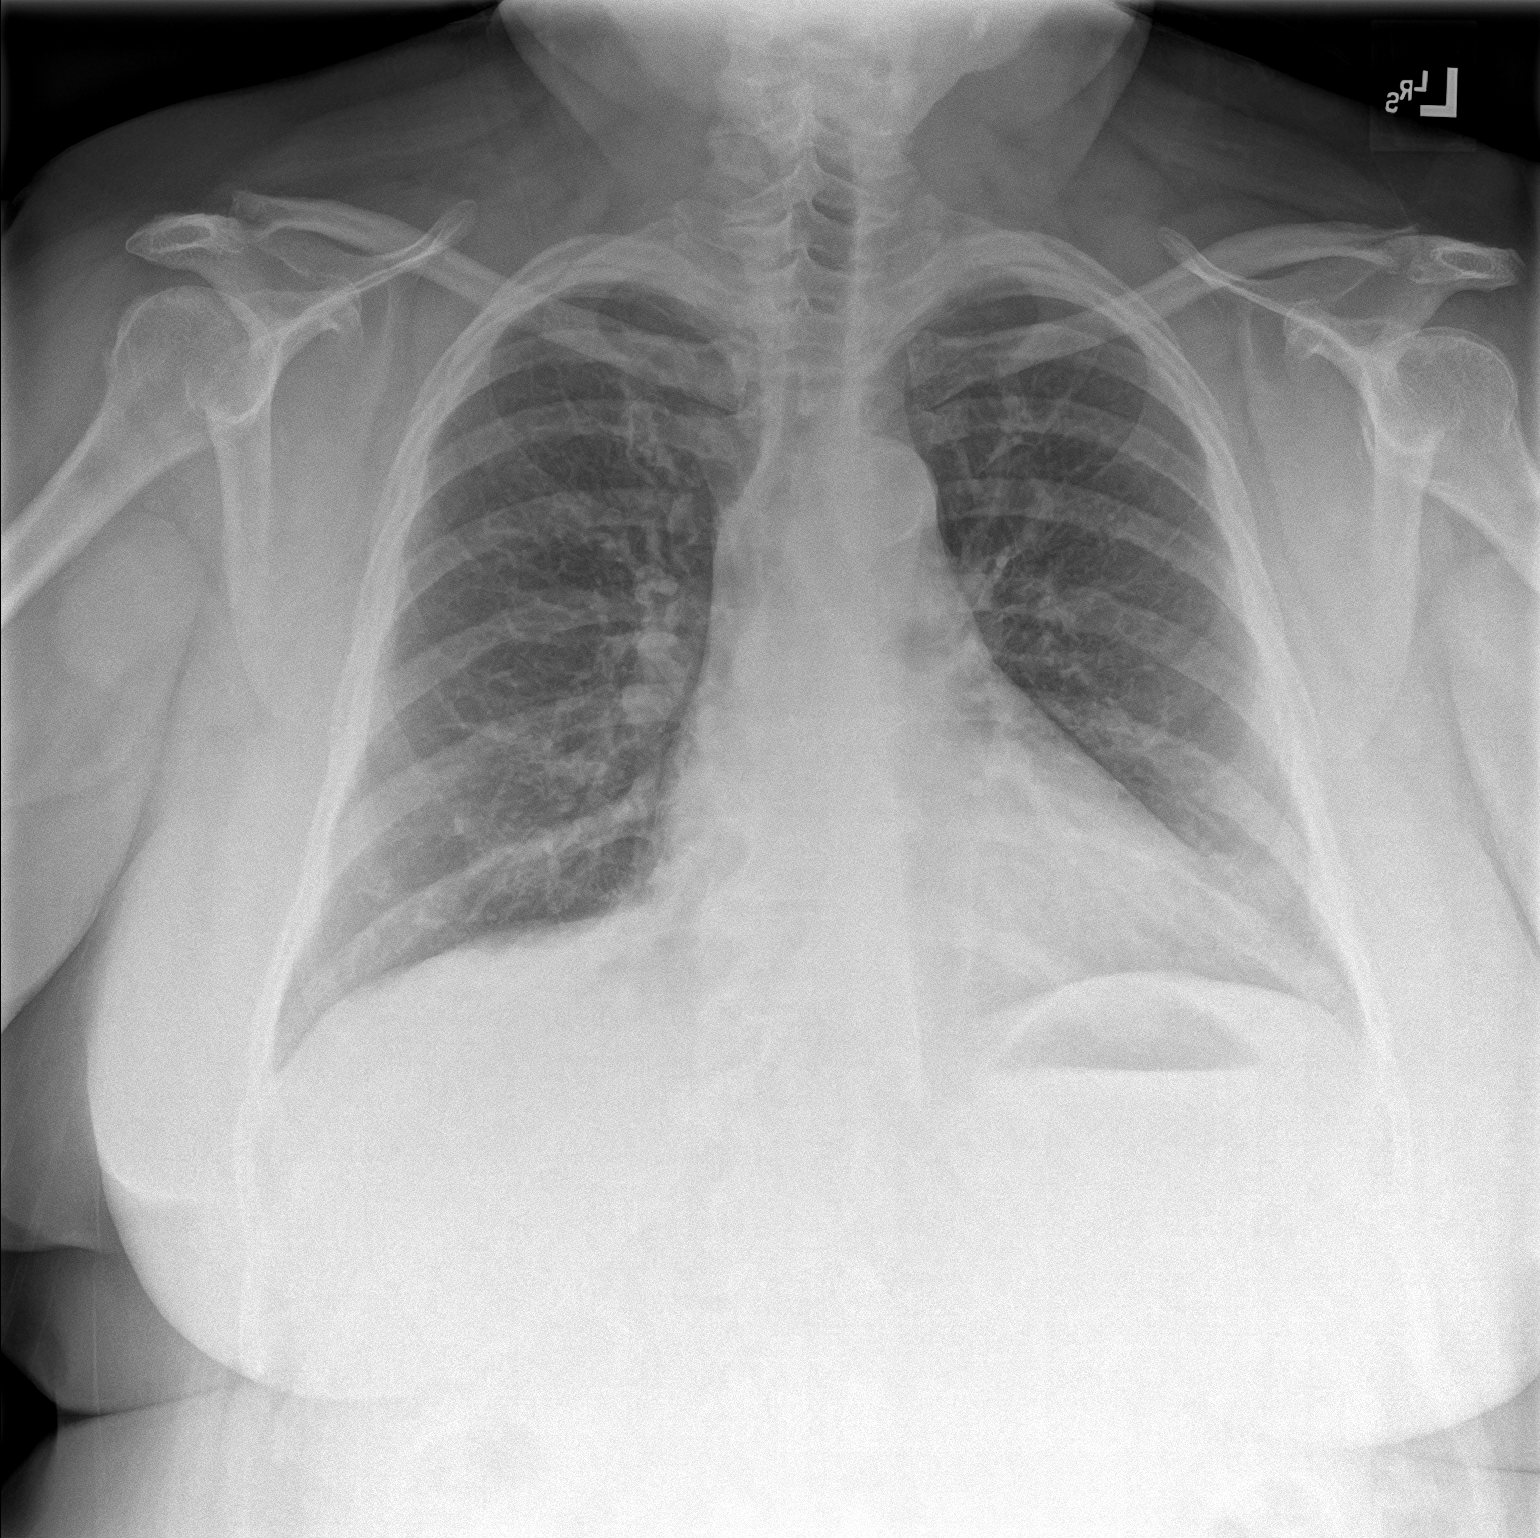
[im 2/2]
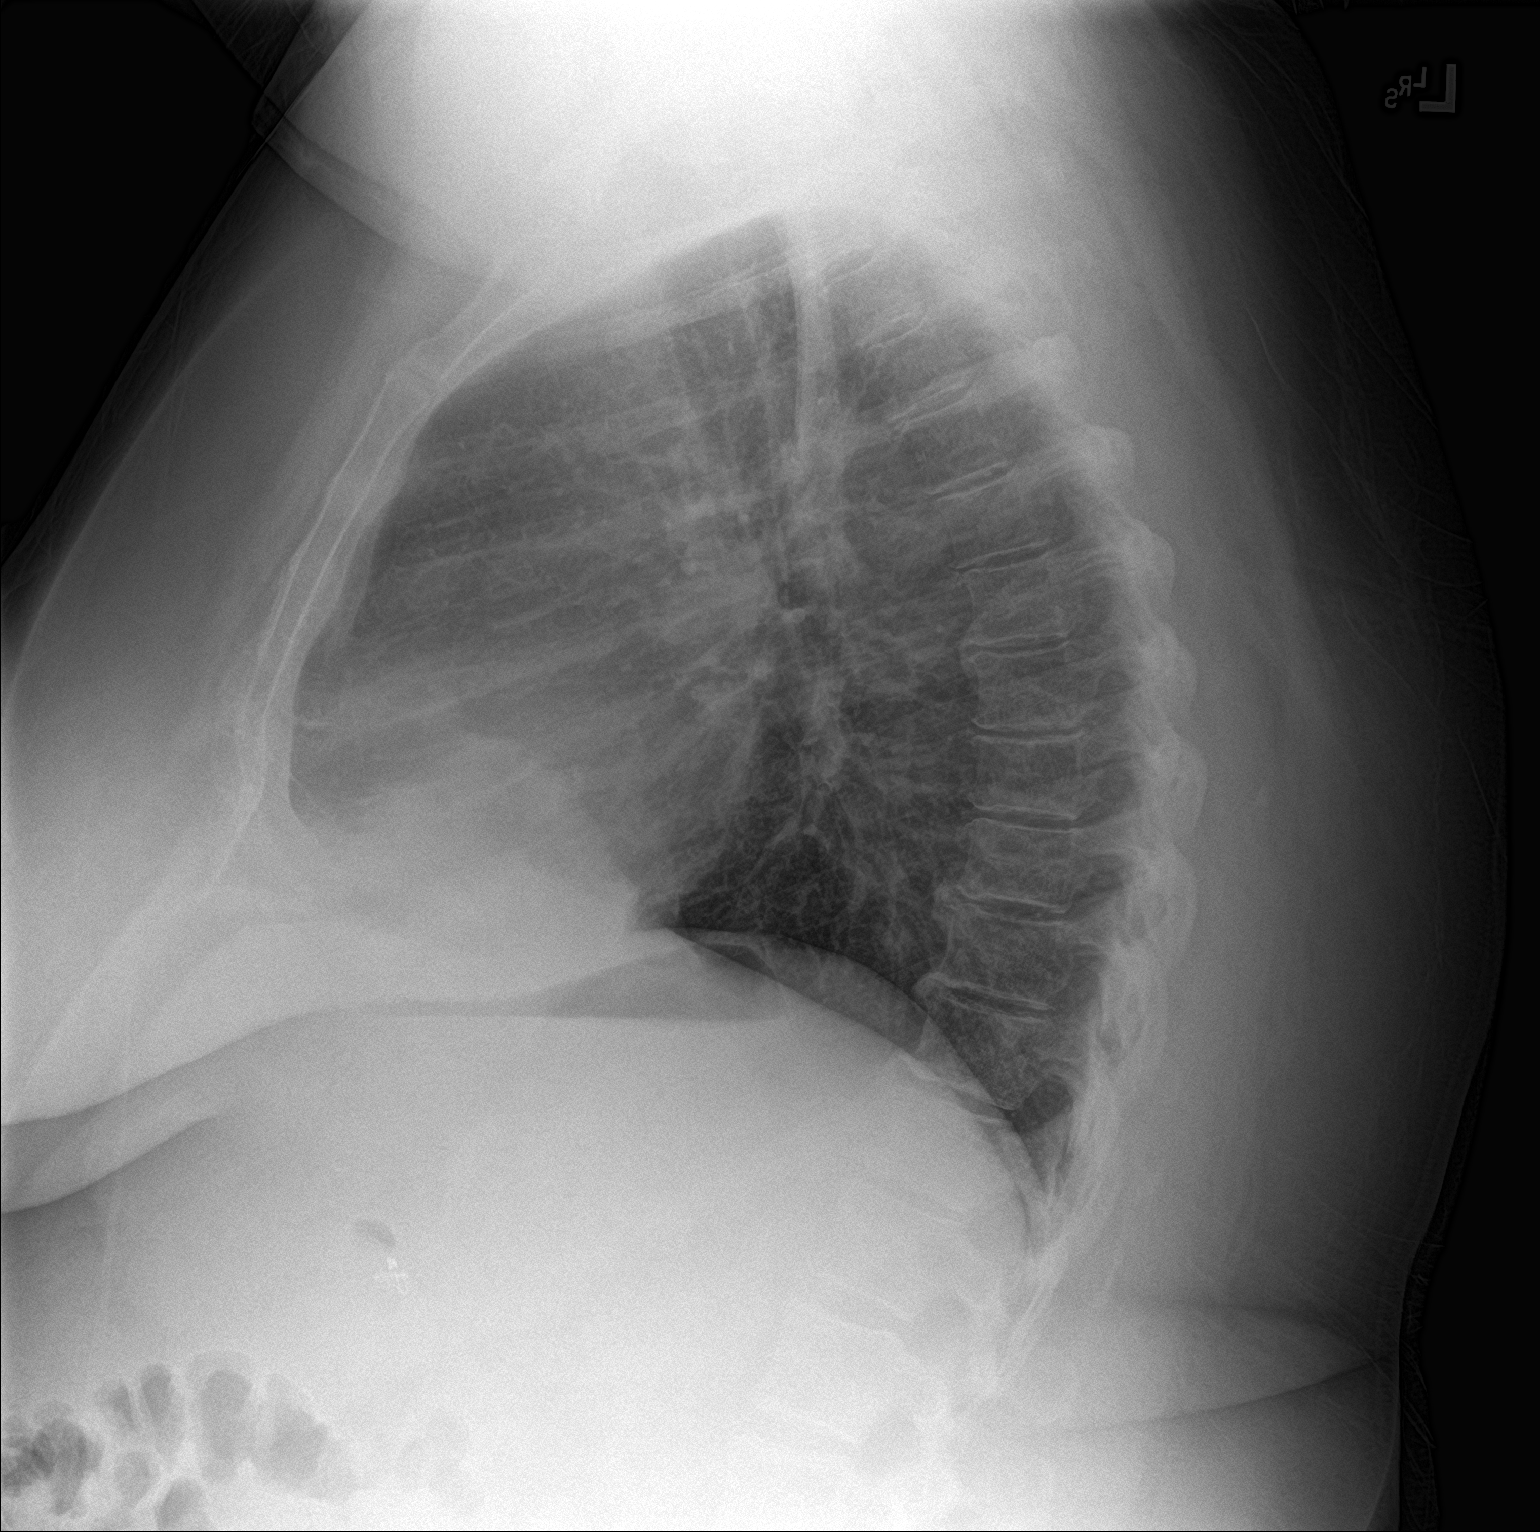

[2 of 2 positions shown; findings below may reference images not displayed]

FINDINGS: Lungs are clear.  No pleural effusion or pneumothorax.

The heart is normal in size.

Mild degenerative changes of the visualized thoracolumbar spine.

Cholecystectomy clips.
IMPRESSION: Normal chest radiographs.

## 2019-04-28 ENCOUNTER — Other Ambulatory Visit: Payer: Self-pay

## 2019-04-28 ENCOUNTER — Encounter: Payer: Self-pay | Admitting: Gerontology

## 2019-04-28 ENCOUNTER — Ambulatory Visit: Payer: Self-pay | Admitting: Gerontology

## 2019-04-28 VITALS — BP 108/74 | HR 59 | Ht 63.0 in | Wt 195.0 lb

## 2019-04-28 DIAGNOSIS — R110 Nausea: Secondary | ICD-10-CM

## 2019-04-28 DIAGNOSIS — R748 Abnormal levels of other serum enzymes: Secondary | ICD-10-CM | POA: Insufficient documentation

## 2019-04-28 NOTE — Patient Instructions (Signed)
Nausea, Adult Nausea is feeling sick to your stomach or feeling that you are about to throw up (vomit). Feeling sick to your stomach is usually not serious, but it may be an early sign of a more serious medical problem. As you feel sicker to your stomach, you may throw up. If you throw up, or if you are not able to drink enough fluids, there is a risk that you may lose too much water in your body (get dehydrated). If you lose too much water in your body, you may:  Feel tired.  Feel thirsty.  Have a dry mouth.  Have cracked lips.  Go pee (urinate) less often. Older adults and people who have other diseases or a weak body defense system (immune system) have a higher risk of losing too much water in the body. The main goals of treating this condition are:  To relieve your nausea.  To ensure your nausea occurs less often.  To prevent throwing up and losing too much fluid. Follow these instructions at home: Watch your symptoms for any changes. Tell your doctor about them. Follow these instructions as told by your doctor. Eating and drinking      Take an ORS (oral rehydration solution). This is a drink that is sold at pharmacies and stores.  Drink clear fluids in small amounts as you are able. These include: ? Water. ? Ice chips. ? Fruit juice that has water added (diluted fruit juice). ? Low-calorie sports drinks.  Eat bland, easy-to-digest foods in small amounts as you are able, such as: ? Bananas. ? Applesauce. ? Rice. ? Low-fat (lean) meats. ? Toast. ? Crackers.  Avoid drinking fluids that have a lot of sugar or caffeine in them. This includes energy drinks, sports drinks, and soda.  Avoid alcohol.  Avoid spicy or fatty foods. General instructions  Take over-the-counter and prescription medicines only as told by your doctor.  Rest at home while you get better.  Drink enough fluid to keep your pee (urine) pale yellow.  Take slow and deep breaths when you feel  sick to your stomach.  Avoid food or things that have strong smells.  Wash your hands often with soap and water. If you cannot use soap and water, use hand sanitizer.  Make sure that all people in your home wash their hands well and often.  Keep all follow-up visits as told by your doctor. This is important. Contact a doctor if:  You feel sicker to your stomach.  You feel sick to your stomach for more than 2 days.  You throw up.  You are not able to drink fluids without throwing up.  You have new symptoms.  You have a fever.  You have a headache.  You have muscle cramps.  You have a rash.  You have pain while peeing.  You feel light-headed or dizzy. Get help right away if:  You have pain in your chest, neck, arm, or jaw.  You feel very weak or you pass out (faint).  You have throw up that is bright red or looks like coffee grounds.  You have bloody or black poop (stools) or poop that looks like tar.  You have a very bad headache, a stiff neck, or both.  You have very bad pain, cramping, or bloating in your belly (abdomen).  You have trouble breathing or you are breathing very quickly.  Your heart is beating very quickly.  Your skin feels cold and clammy.  You feel confused.    You have signs of losing too much water in your body, such as: ? Dark pee, very little pee, or no pee. ? Cracked lips. ? Dry mouth. ? Sunken eyes. ? Sleepiness. ? Weakness. These symptoms may be an emergency. Do not wait to see if the symptoms will go away. Get medical help right away. Call your local emergency services (911 in the [place] Do not drive yourself to the hospital. Summary  Nausea is feeling sick to your stomach or feeling that you are about to throw up (vomit).  If you throw up, or if you are not able to drink enough fluids, there is a risk that you may lose too much water in your body (get dehydrated).  Eat and drink what your doctor tells you. Take  over-the-counter and prescription medicines only as told by your doctor.  Contact a doctor right away if your symptoms get worse or you have new symptoms.  Keep all follow-up visits as told by your doctor. This is important. This information is not intended to replace advice given to you by your health care provider. Make sure you discuss any questions you have with your health care provider. Document Released: 04/12/2011 Document Revised: 10/01/2017 Document Reviewed: 10/01/2017 Elsevier Patient Education  2020 [employer]  

## 2019-04-28 NOTE — Progress Notes (Signed)
Established Patient Office Visit  Subjective:  Patient ID: Monique Edwards, female    DOB: 30-Mar-1966  Age: 53 y.o. MRN: [id number]  CC:  Chief Complaint  Patient presents with  . Nausea    HPI Monique Edwards presents for follow up of intermittent nausea  in the morning that has been going on for 4 weeks.  She states that she continues to experience intermittent nausea in the morning after drinking room temperature water, and nause occurs twice weekly. She reports that nausea subsides within an hour. She denies vomiting,epigastric pain, or abdominal pain. She states that she takes all her medications in an empty stomach. She denies any relieving factor. Her Liver enzymes done on 03/11/2019, AST was 54 and ALT 43. She admits to drinking two 240oz can of beer twice weekly and takes her Lactulose as needed. She denies right upper quadrant pain, and jaundice. She denies chest pain, palpitation, light headedness, fever and chills. She states that she's doing well and offers no further complaints.  Past Medical History:  Diagnosis Date  . COPD (chronic obstructive pulmonary disease) ([place])   . Fatty liver   . GERD (gastroesophageal reflux disease)   . Hypertension     Past Surgical History:  Procedure Laterality Date  . CHOLECYSTECTOMY    . ESOPHAGOGASTRODUODENOSCOPY (EGD) WITH PROPOFOL N/A 06/06/2018   Procedure: ESOPHAGOGASTRODUODENOSCOPY (EGD) WITH PROPOFOL;  Surgeon: Jonathon Bellows, MD;  Location: [facility] ENDOSCOPY;  Service: Gastroenterology;  Laterality: N/A;    Family History  Problem Relation Age of Onset  . Hypertension Mother   . Stroke Mother   . Diabetes Father   . Arthritis Sister   . Thyroid disease Sister   . Hypertension Brother   . Hypertension Brother     Social History   Socioeconomic History  . Marital status: Widowed    Spouse name: Not on file  . Number of children: Not on file  . Years of education: Not on file  . Highest education level: Not on file  Occupational  History  . Not on file  Tobacco Use  . Smoking status: Current Some Day Smoker    Packs/day: 1.00    Types: Cigarettes    Start date: 11/04/1980  . Smokeless tobacco: Never Used  . Tobacco comment: occasionally  Substance and Sexual Activity  . Alcohol use: No    Comment: former occassional  . Drug use: Not Currently    Types: Cocaine  . Sexual activity: Not on file  Other Topics Concern  . Not on file  Social History Narrative  . Not on file   Social Determinants of Health   Financial Resource Strain:   . Difficulty of Paying Living Expenses: Not on file  Food Insecurity:   . Worried About Charity fundraiser in the Last Year: Not on file  . Ran Out of Food in the Last Year: Not on file  Transportation Needs:   . Lack of Transportation (Medical): Not on file  . Lack of Transportation (Non-Medical): Not on file  Physical Activity:   . Days of Exercise per Week: Not on file  . Minutes of Exercise per Session: Not on file  Stress:   . Feeling of Stress : Not on file  Social Connections:   . Frequency of Communication with Friends and Family: Not on file  . Frequency of Social Gatherings with Friends and Family: Not on file  . Attends Religious Services: Not on file  . Active Member of Clubs or Organizations:  Not on file  . Attends Archivist Meetings: Not on file  . Marital Status: Not on file  Intimate Partner Violence:   . Fear of Current or Ex-Partner: Not on file  . Emotionally Abused: Not on file  . Physically Abused: Not on file  . Sexually Abused: Not on file    Outpatient Medications Prior to Visit  Medication Sig Dispense Refill  . albuterol (VENTOLIN HFA) 108 (90 Base) MCG/ACT inhaler Inhale 2 puffs into the lungs every 6 (six) hours as needed for wheezing or shortness of breath. 18 g 2  . Blood Pressure Monitor KIT 1 kit by Does not apply route daily. 1 kit 0  . cetirizine (ZYRTEC) 10 MG tablet Take 10 mg by mouth daily.    . diclofenac Sodium  (VOLTAREN) 1 % GEL Apply 4 g topically 4 (four) times daily. 50 g 0  . ferrous sulfate 325 (65 FE) MG EC tablet Take 325 mg by mouth daily with breakfast.    . Fluticasone-Salmeterol (ADVAIR DISKUS) 250-50 MCG/DOSE AEPB Inhale 1 puff into the lungs 2 (two) times daily. 1 puff twice daiy. Rinse mouth after use. 180 each 3  . lactulose (CHRONULAC) 10 GM/15ML solution Take 15 mLs (10 g total) by mouth 2 (two) times daily. 900 mL 5  . losartan (COZAAR) 25 MG tablet Take 1 tablet (25 mg total) by mouth daily. 30 tablet 4  . omeprazole (PRILOSEC) 20 MG capsule Take 1 capsule (20 mg total) by mouth daily. 30 capsule 4  . umeclidinium bromide (INCRUSE ELLIPTA) 62.5 MCG/INH AEPB Inhale 1 puff into the lungs daily. 1 each 3   No facility-administered medications prior to visit.    Allergies  Allergen Reactions  . Amlodipine Swelling    Peripheral edema  . Lisinopril Cough    ROS Review of Systems  Constitutional: Negative.   Respiratory: Negative.   Cardiovascular: Negative.   Gastrointestinal: Positive for nausea. Negative for vomiting.  Genitourinary: Negative.   Skin: Negative.   Neurological: Negative.   Psychiatric/Behavioral: Negative.       Objective:    Physical Exam  Constitutional: She is oriented to person, place, and time. She appears well-developed.  HENT:  Head: Normocephalic and atraumatic.  Eyes: Pupils are equal, round, and reactive to light. EOM are normal.  Cardiovascular: Normal rate and regular rhythm.  Pulmonary/Chest: Effort normal and breath sounds normal.  Abdominal: Soft. Bowel sounds are normal. She exhibits no distension and no mass. There is no abdominal tenderness. There is no rebound and no guarding.  Neurological: She is alert and oriented to person, place, and time.  Psychiatric: She has a normal mood and affect. Her behavior is normal. Judgment and thought content normal.    BP 108/74 (BP Location: Right Arm, Patient Position: Sitting)   Pulse (!)  59   Ht 5' 3" (1.6 m)   Wt 195 lb (88.5 kg)   LMP 02/28/2014 (Approximate)   SpO2 97%   BMI 34.54 kg/m  Wt Readings from Last 3 Encounters:  04/28/19 195 lb (88.5 kg)  04/08/19 193 lb (87.5 kg)  03/18/19 199 lb 8 oz (90.5 kg)     Health Maintenance Due  Topic Date Due  . HIV Screening  03/26/1979  . TETANUS/TDAP  03/26/1983  . COLONOSCOPY  03/25/2014    There are no preventive care reminders to display for this patient.  Lab Results  Component Value Date   TSH 2.130 03/19/2018   Lab Results  Component Value Date  WBC 3.9 03/11/2019   HGB 13.9 03/11/2019   HCT 41.0 03/11/2019   MCV 93 03/11/2019   PLT 83 (LL) 03/11/2019   Lab Results  Component Value Date   NA 143 03/11/2019   K 3.8 03/11/2019   CO2 20 03/11/2019   GLUCOSE 86 03/11/2019   BUN 7 03/11/2019   CREATININE 0.80 03/11/2019   BILITOT 0.9 03/11/2019   ALKPHOS 68 03/11/2019   AST 54 (H) 03/11/2019   ALT 43 (H) 03/11/2019   PROT 7.3 03/11/2019   ALBUMIN 4.6 03/11/2019   CALCIUM 9.4 03/11/2019   ANIONGAP 10 10/02/2018   Lab Results  Component Value Date   CHOL 168 03/11/2019   Lab Results  Component Value Date   HDL 76 03/11/2019   Lab Results  Component Value Date   LDLCALC 77 03/11/2019   Lab Results  Component Value Date   TRIG 81 03/11/2019   Lab Results  Component Value Date   CHOLHDL 2.2 03/11/2019   Lab Results  Component Value Date   HGBA1C 5.0 03/11/2019      Assessment & Plan:   1. Nausea - She was advised not to drink water in an empty stomach in the morning, and to take her Omeprazole with a sip of water. Also she was advised to eat breakfast before taking her medications. She was encouraged to notify clinic and go to the ED with worsening symptoms.  2. Elevated liver enzymes -She was advised on alcohol abstinence, and to take her lactulose as prescribed. -Her liver enzymes will be rechecked in the future.     Follow-up: Return in about 5 weeks (around  06/03/2019), or if symptoms worsen or fail to improve.    [place] Jerold Coombe, NP

## 2019-05-13 ENCOUNTER — Encounter: Payer: Self-pay | Admitting: Primary Care

## 2019-05-13 ENCOUNTER — Ambulatory Visit (INDEPENDENT_AMBULATORY_CARE_PROVIDER_SITE_OTHER): Payer: Self-pay | Admitting: Primary Care

## 2019-05-13 DIAGNOSIS — J441 Chronic obstructive pulmonary disease with (acute) exacerbation: Secondary | ICD-10-CM

## 2019-05-13 MED ORDER — PREDNISONE 10 MG PO TABS: ORAL_TABLET | ORAL | 0 refills | Status: DC

## 2019-05-13 MED ORDER — GUAIFENESIN ER 600 MG PO TB12: 600.0000 mg | ORAL_TABLET | Freq: Two times a day (BID) | ORAL | 0 refills | Status: DC

## 2019-05-13 MED ORDER — DOXYCYCLINE HYCLATE 100 MG PO TABS: 100.0000 mg | ORAL_TABLET | Freq: Two times a day (BID) | ORAL | 0 refills | Status: DC

## 2019-05-13 NOTE — Patient Instructions (Addendum)
Pleasure speaking with you today Ms Monique Edwards Treating you for acute bronchitis/COPD exacerbation  Rx: - Doxycycline 1 tab twice daily x 7 days - Prednisone 20mg  daily x 5 days - Mucinex 600mg  twice daily x 1-2 weeks for chest congestion   Recommendations: - Continue Incruse once daily - Continue Advair one puff twice daily  - Monitor for fever, worsening shortness of breath, purulent mucus production, headache, change in smell or taste  Follow-up: - 3 months with Dr. or  - Call office/return sooner if symptoms do not improve in 1-2 weeks or worsen in any day

## 2019-05-13 NOTE — Progress Notes (Signed)
Reviewed and agree with assessment/plan.   Sharron Simpson, MD [facility] 05/02/2016, 12:24 PM Pager:  [phone]  

## 2019-05-13 NOTE — Progress Notes (Signed)
Virtual Visit via Telephone Note  I connected with Monique Edwards on 05/13/19 at  9:30 AM EST by telephone and verified that I am speaking with the correct person using two identifiers.  Location: Patient: Home Provider: [employer]   I discussed the limitations, risks, security and privacy concerns of performing an evaluation and management service by telephone and the availability of in person appointments. I also discussed with the patient that there may be a patient responsible charge related to this service. The patient expressed understanding and agreed to proceed.   History of Present Illness: 53 year old, current some day smoker. PMH significant for COPD with emphysema, hypertension, GERD, alcohol abuse, elevated liver enzymes, major depressive disorder, obesity, thrombocytopenia. Patient of Dr. Nicholos Johns, last seen on 11/13/18. Maintained on Incruse and started on Advair.   05/13/2019 Patient contacted today for 6 month follow-up. Her breathing is at baseline, no significant changes. She experiences shortness of breath with exertion. Developed productive cough with yellow mucus three days ago with associated mild wheezing. She is compliant with Advair and Incruse. She has required her albuterol rescue inhaler twice in the last two months. She is still smoking about 5 cigarettes a month.    Observations/Objective:  - Temp 98.4  Assessment and Plan:  Acute COPD exacerbation - Doxycycline 1 tab twice daily x 7 days - Prednisone 20mg  daily x 5 days - Mucinex 600mg  twice daily x 1-2 weeks for chest congestion  - Continue Incruse once daily - Continue Advair one puff twice daily   Follow Up Instructions:  - 3 months with Dr. or  - Call office/return sooner if symptoms do not improve in 1-2 weeks or worsen in any day   I discussed the assessment and treatment plan with the patient. The patient was provided an opportunity to ask questions and all were answered.  The patient agreed with the plan and demonstrated an understanding of the instructions.   The patient was advised to call back or seek an in-person evaluation if the symptoms worsen or if the condition fails to improve as anticipated.  I provided 22 minutes of non-face-to-face time during this encounter.   Belia Heman, NP

## 2019-05-26 ENCOUNTER — Other Ambulatory Visit: Payer: Self-pay

## 2019-05-26 ENCOUNTER — Ambulatory Visit: Payer: Self-pay

## 2019-05-26 DIAGNOSIS — Z79899 Other long term (current) drug therapy: Secondary | ICD-10-CM

## 2019-05-26 NOTE — Progress Notes (Signed)
Medication Management Clinic Visit Note  Patient: Monique Edwards MRN: [id number] Date of Birth: 12/10/1965 PCP: Baxter Hire, MD   Monique Edwards 54 y.o. female presents for a telephone medication therapy management visit with the pharmacist today. Patient identified using two patient identifiers.  LMP 02/28/2014 (Approximate)   Patient Information   Past Medical History:  Diagnosis Date  . COPD (chronic obstructive pulmonary disease) ([place])   . Fatty liver   . GERD (gastroesophageal reflux disease)   . Hypertension       Past Surgical History:  Procedure Laterality Date  . CHOLECYSTECTOMY    . ESOPHAGOGASTRODUODENOSCOPY (EGD) WITH PROPOFOL N/A 06/06/2018   Procedure: ESOPHAGOGASTRODUODENOSCOPY (EGD) WITH PROPOFOL;  Surgeon: Jonathon Bellows, MD;  Location: [facility] ENDOSCOPY;  Service: Gastroenterology;  Laterality: N/A;     Family History  Problem Relation Age of Onset  . Hypertension Mother   . Stroke Mother   . Diabetes Father   . Arthritis Sister   . Thyroid disease Sister   . Hypertension Brother   . Hypertension Brother     New Diagnoses (since last visit): n/a  Family Support: Comments:did not ask    Social History   Substance and Sexual Activity  Alcohol Use No   Comment: former occassional   One 40 oz beer per week   Social History   Tobacco Use  Smoking Status Current Some Day Smoker  . Packs/day: 1.00  . Years: 16.00  . Pack years: 16.00  . Types: Cigarettes  . Start date: 11/04/1980  Smokeless Tobacco Never Used  Tobacco Comment   occasionally 3 cigs daily--ms   1 cigarette per day   Health Maintenance  Topic Date Due  . HIV Screening  03/26/1979  . TETANUS/TDAP  03/26/1983  . COLONOSCOPY  03/25/2014  . MAMMOGRAM  01/30/2020  . PAP SMEAR-Modifier  01/29/2021  . INFLUENZA VACCINE  Completed  . Hepatitis C Screening  Completed   Outpatient Encounter Medications as of 05/26/2019  Medication Sig  . albuterol (VENTOLIN HFA) 108 (90 Base)  MCG/ACT inhaler Inhale 2 puffs into the lungs every 6 (six) hours as needed for wheezing or shortness of breath.  . Blood Pressure Monitor KIT 1 kit by Does not apply route daily.  . cetirizine (ZYRTEC) 10 MG tablet Take 10 mg by mouth daily.  . Fluticasone-Salmeterol (ADVAIR DISKUS) 250-50 MCG/DOSE AEPB Inhale 1 puff into the lungs 2 (two) times daily. 1 puff twice daiy. Rinse mouth after use.  . lactulose (CHRONULAC) 10 GM/15ML solution Take 15 mLs (10 g total) by mouth 2 (two) times daily.  Marland Kitchen losartan (COZAAR) 25 MG tablet Take 1 tablet (25 mg total) by mouth daily.  . naproxen sodium (ALEVE) 220 MG tablet Take 440 mg by mouth daily as needed (Knee/hip pain).  Marland Kitchen omeprazole (PRILOSEC) 20 MG capsule Take 1 capsule (20 mg total) by mouth daily.  Marland Kitchen umeclidinium bromide (INCRUSE ELLIPTA) 62.5 MCG/INH AEPB Inhale 1 puff into the lungs daily.  . diclofenac Sodium (VOLTAREN) 1 % GEL Apply 4 g topically 4 (four) times daily. (Patient not taking: Reported on 05/26/2019)  . doxycycline (VIBRA-TABS) 100 MG tablet Take 1 tablet (100 mg total) by mouth 2 (two) times daily.  . ferrous sulfate 325 (65 FE) MG EC tablet Take 325 mg by mouth daily with breakfast.  . guaiFENesin (MUCINEX) 600 MG 12 hr tablet Take 1 tablet (600 mg total) by mouth 2 (two) times daily.  . predniSONE (DELTASONE) 10 MG tablet Take 2 tabs x 5 days  No facility-administered encounter medications on file as of 05/26/2019.    Health Maintenance/Date Completed  Last ED visit: Admitted to [facility] 10/03/2018-10/05/2018 (MDD) Last Visit to PCP: 04/28/2019 Next Visit to PCP: 06/04/2019 Specialist Visit: 05/13/2019 (televisit with pulmonology for COPD exacerbation) Dental Exam: Did not ask Eye Exam: Did not ask Pelvic/PAP Exam: Denies recent exam Mammogram: Denies recent exam DEXA: n/a Colonoscopy: 06/28/2015. She is recommended for a q5 year screening Flu Vaccine: Yes Pneumonia Vaccine: Never COVID-19 Vaccine: Never Shingrix Vaccine:  Never  Assessment and Plan:  1. COPD -Advair one puff twice daily, Incruse one puff daily, albuterol PRN -Endorses washing mouth out after using Advair -Requiring rescue inhaler 2-3x/month -Recently completed doxycycline and prednisone course for COPD exacerbation -Still smoking about 1 cigarette per day. Encouraged patient to continue to cut back with ultimate goal of cessation to prevent further progression of lung disease  2. Cirrhosis -History of hepatic encephalopathy on lactulose but non-adherent because she does not like having to use the restroom frequently. Counseled patient on the importance of this medication. -Drinking one 40 oz beer per week. Advised patient to abstain from alcohol to prevent progression of liver disease -S/p treatment for HCV with SVR -Lab abnormalities include elevated LFTs and thrombocytopenia -Upper endoscopy 05/2018 - no esophageal varices  3. Essential hypertension -Losartan 25 mg daily -Checks blood pressure two times weekly at home. Did not have access to log at time of phone call -Denies signs/symptoms of both hypotension and hypertension  4. GERD without esophagitis - Omeprazole 20 mg QAM before breakfast  5. Health care maintenance -Recommended vaccines for this patient include PPSV23 one dose before age 61 (COPD), 2 dose series Shingrix (age >=50 y/o), and COVID-19 vaccine -She is due for pap smear and mammogram   [place] Resident 26 May 2019

## 2019-06-01 ENCOUNTER — Other Ambulatory Visit: Payer: Self-pay

## 2019-06-04 ENCOUNTER — Other Ambulatory Visit: Payer: Self-pay

## 2019-06-04 ENCOUNTER — Encounter: Payer: Self-pay | Admitting: Gerontology

## 2019-06-04 ENCOUNTER — Ambulatory Visit: Payer: Self-pay | Admitting: Gerontology

## 2019-06-04 VITALS — BP 132/81 | HR 60 | Ht 63.0 in | Wt 198.0 lb

## 2019-06-04 DIAGNOSIS — Z8709 Personal history of other diseases of the respiratory system: Secondary | ICD-10-CM

## 2019-06-04 DIAGNOSIS — R110 Nausea: Secondary | ICD-10-CM

## 2019-06-04 MED ORDER — GUAIFENESIN 100 MG/5ML PO SYRP: 100.0000 mg | ORAL_SOLUTION | Freq: Three times a day (TID) | ORAL | 0 refills | Status: DC | PRN

## 2019-06-04 NOTE — Patient Instructions (Signed)

## 2019-06-04 NOTE — Progress Notes (Signed)
Established Patient Office Visit  Subjective:  Patient ID: Monique Edwards, female    DOB: 28-Apr-1966  Age: 54 y.o. MRN: [id number]  CC: No chief complaint on file.   HPI Monique Edwards presents for follow up of nausea. States that nause has improved by 80% and she's working on improving her diet. She states that her acid reflux is under control and she's compliant with her medications. She was seen virtually by Kandy Garrison NP for her COPD on 05/13/2019. She states that her breathing is stable, continue to experience mild intermittent shortness of breath with exertion, and productive cough with small amount of light yellowish phlegm. She states that she has not taking Muccinex that was prescribed because of cost. She states that she finished the course of antibiotics and prednisone. She reports that she smokes 2 cigarettes once in a while. He denies chest pain, palpitation, light headedness, fever, and chills. Overall, she states that she's doing well and offers no further complaint.  Past Medical History:  Diagnosis Date  . COPD (chronic obstructive pulmonary disease) ([place])   . Fatty liver   . GERD (gastroesophageal reflux disease)   . Hypertension     Past Surgical History:  Procedure Laterality Date  . CHOLECYSTECTOMY    . ESOPHAGOGASTRODUODENOSCOPY (EGD) WITH PROPOFOL N/A 06/06/2018   Procedure: ESOPHAGOGASTRODUODENOSCOPY (EGD) WITH PROPOFOL;  Surgeon: Jonathon Bellows, MD;  Location: [facility] ENDOSCOPY;  Service: Gastroenterology;  Laterality: N/A;    Family History  Problem Relation Age of Onset  . Hypertension Mother   . Stroke Mother   . Diabetes Father   . Arthritis Sister   . Thyroid disease Sister   . Hypertension Brother   . Hypertension Brother     Social History   Socioeconomic History  . Marital status: Widowed    Spouse name: Not on file  . Number of children: Not on file  . Years of education: Not on file  . Highest education level: Not on file  Occupational History  .  Not on file  Tobacco Use  . Smoking status: Current Some Day Smoker    Packs/day: 1.00    Years: 16.00    Pack years: 16.00    Types: Cigarettes    Start date: 11/04/1980  . Smokeless tobacco: Never Used  . Tobacco comment: occasionally 3 cigs daily--ms  Substance and Sexual Activity  . Alcohol use: No    Comment: former occassional  . Drug use: Not Currently    Types: Cocaine  . Sexual activity: Not on file  Other Topics Concern  . Not on file  Social History Narrative  . Not on file   Social Determinants of Health   Financial Resource Strain:   . Difficulty of Paying Living Expenses: Not on file  Food Insecurity:   . Worried About Charity fundraiser in the Last Year: Not on file  . Ran Out of Food in the Last Year: Not on file  Transportation Needs:   . Lack of Transportation (Medical): Not on file  . Lack of Transportation (Non-Medical): Not on file  Physical Activity:   . Days of Exercise per Week: Not on file  . Minutes of Exercise per Session: Not on file  Stress:   . Feeling of Stress : Not on file  Social Connections:   . Frequency of Communication with Friends and Family: Not on file  . Frequency of Social Gatherings with Friends and Family: Not on file  . Attends Religious Services: Not on  file  . Active Member of Clubs or Organizations: Not on file  . Attends Archivist Meetings: Not on file  . Marital Status: Not on file  Intimate Partner Violence:   . Fear of Current or Ex-Partner: Not on file  . Emotionally Abused: Not on file  . Physically Abused: Not on file  . Sexually Abused: Not on file    Outpatient Medications Prior to Visit  Medication Sig Dispense Refill  . albuterol (VENTOLIN HFA) 108 (90 Base) MCG/ACT inhaler Inhale 2 puffs into the lungs every 6 (six) hours as needed for wheezing or shortness of breath. 18 g 2  . Blood Pressure Monitor KIT 1 kit by Does not apply route daily. 1 kit 0  . cetirizine (ZYRTEC) 10 MG tablet Take 10  mg by mouth daily.    . Fluticasone-Salmeterol (ADVAIR DISKUS) 250-50 MCG/DOSE AEPB Inhale 1 puff into the lungs 2 (two) times daily. 1 puff twice daiy. Rinse mouth after use. 180 each 3  . lactulose (CHRONULAC) 10 GM/15ML solution Take 15 mLs (10 g total) by mouth 2 (two) times daily. 900 mL 5  . losartan (COZAAR) 25 MG tablet Take 1 tablet (25 mg total) by mouth daily. 30 tablet 4  . omeprazole (PRILOSEC) 20 MG capsule Take 1 capsule (20 mg total) by mouth daily. 30 capsule 4  . umeclidinium bromide (INCRUSE ELLIPTA) 62.5 MCG/INH AEPB Inhale 1 puff into the lungs daily. 1 each 3  . ferrous sulfate 325 (65 FE) MG EC tablet Take 325 mg by mouth daily with breakfast.    . naproxen sodium (ALEVE) 220 MG tablet Take 440 mg by mouth daily as needed (Knee/hip pain).    Marland Kitchen diclofenac Sodium (VOLTAREN) 1 % GEL Apply 4 g topically 4 (four) times daily. (Patient not taking: Reported on 05/26/2019) 50 g 0  . doxycycline (VIBRA-TABS) 100 MG tablet Take 1 tablet (100 mg total) by mouth 2 (two) times daily. (Patient not taking: Reported on 06/04/2019) 14 tablet 0  . guaiFENesin (MUCINEX) 600 MG 12 hr tablet Take 1 tablet (600 mg total) by mouth 2 (two) times daily. (Patient not taking: Reported on 06/04/2019) 30 tablet 0  . predniSONE (DELTASONE) 10 MG tablet Take 2 tabs x 5 days (Patient not taking: Reported on 06/04/2019) 10 tablet 0   No facility-administered medications prior to visit.    Allergies  Allergen Reactions  . Amlodipine Swelling    Peripheral edema  . Lisinopril Cough    ROS Review of Systems  Constitutional: Negative.   HENT: Negative.   Respiratory: Positive for cough (intermittent mild productive cough) and shortness of breath.   Cardiovascular: Negative.   Skin: Negative.   Neurological: Negative.   Psychiatric/Behavioral: Negative.       Objective:    Physical Exam  Constitutional: She is oriented to person, place, and time. She appears well-developed.  HENT:  Head:  Normocephalic and atraumatic.  Deferred per Covid protocol  Eyes: Pupils are equal, round, and reactive to light. EOM are normal.  Cardiovascular: Normal rate and regular rhythm.  Pulmonary/Chest: Effort normal. She has wheezes (mild expiratory wheezes to bil lower lobes).  Neurological: She is alert and oriented to person, place, and time.  Skin: Skin is warm and dry.  Psychiatric: She has a normal mood and affect. Her behavior is normal. Judgment and thought content normal.    BP 132/81 (BP Location: Right Arm, Patient Position: Sitting)   Pulse 60   Ht '5\' 3"'$  (1.6 m)  Wt 198 lb (89.8 kg)   LMP 02/28/2014 (Approximate)   BMI 35.07 kg/m  Wt Readings from Last 3 Encounters:  06/04/19 198 lb (89.8 kg)  04/28/19 195 lb (88.5 kg)  04/08/19 193 lb (87.5 kg)   She was advised to continue on her weight loss regimen.  Health Maintenance Due  Topic Date Due  . HIV Screening  03/26/1979  . TETANUS/TDAP  03/26/1983  . COLONOSCOPY  03/25/2014    There are no preventive care reminders to display for this patient.  Lab Results  Component Value Date   TSH 2.130 03/19/2018   Lab Results  Component Value Date   WBC 3.9 03/11/2019   HGB 13.9 03/11/2019   HCT 41.0 03/11/2019   MCV 93 03/11/2019   PLT 83 (LL) 03/11/2019   Lab Results  Component Value Date   NA 143 03/11/2019   K 3.8 03/11/2019   CO2 20 03/11/2019   GLUCOSE 86 03/11/2019   BUN 7 03/11/2019   CREATININE 0.80 03/11/2019   BILITOT 0.9 03/11/2019   ALKPHOS 68 03/11/2019   AST 54 (H) 03/11/2019   ALT 43 (H) 03/11/2019   PROT 7.3 03/11/2019   ALBUMIN 4.6 03/11/2019   CALCIUM 9.4 03/11/2019   ANIONGAP 10 10/02/2018   Lab Results  Component Value Date   CHOL 168 03/11/2019   Lab Results  Component Value Date   HDL 76 03/11/2019   Lab Results  Component Value Date   LDLCALC 77 03/11/2019   Lab Results  Component Value Date   TRIG 81 03/11/2019   Lab Results  Component Value Date   CHOLHDL 2.2  03/11/2019   Lab Results  Component Value Date   HGBA1C 5.0 03/11/2019      Assessment & Plan:   1. Nausea - Nausea has improved 80%, she was advised to notify clinic for worsening symptoms and encouraged to continue modifying his diet.  2. History of COPD - She had mild wheezes and cough, she will start Robitussin, and to use Advair and Incruse Ellipta as prescribed. She was advised to go t the ED worsening symptoms and on smoking cessation. - guaifenesin (ROBITUSSIN) 100 MG/5ML syrup; Take 5 mLs (100 mg total) by mouth 3 (three) times daily as needed for cough.  Dispense: 240 mL; Refill: 0     Follow-up: Return in about 3 months (around 09/02/2019), or if symptoms worsen or fail to improve.    Ariz Terrones Jerold Coombe, NP

## 2019-07-04 ENCOUNTER — Emergency Department: Payer: Self-pay

## 2019-07-04 ENCOUNTER — Other Ambulatory Visit: Payer: Self-pay

## 2019-07-04 ENCOUNTER — Encounter: Payer: Self-pay | Admitting: Emergency Medicine

## 2019-07-04 ENCOUNTER — Emergency Department
Admission: EM | Admit: 2019-07-04 | Discharge: 2019-07-04 | Disposition: A | Discharge status: Home / Self Care | Payer: Self-pay | Attending: Emergency Medicine | Admitting: Emergency Medicine

## 2019-07-04 DIAGNOSIS — Z20822 Contact with and (suspected) exposure to covid-19: Secondary | ICD-10-CM | POA: Insufficient documentation

## 2019-07-04 DIAGNOSIS — I10 Essential (primary) hypertension: Secondary | ICD-10-CM | POA: Insufficient documentation

## 2019-07-04 DIAGNOSIS — F17210 Nicotine dependence, cigarettes, uncomplicated: Secondary | ICD-10-CM | POA: Insufficient documentation

## 2019-07-04 DIAGNOSIS — J441 Chronic obstructive pulmonary disease with (acute) exacerbation: Secondary | ICD-10-CM | POA: Insufficient documentation

## 2019-07-04 LAB — TROPONIN I (HIGH SENSITIVITY): Troponin I (High Sensitivity): 5 ng/L (ref <18)

## 2019-07-04 LAB — CBC WITH DIFFERENTIAL/PLATELET
Abs Immature Granulocytes: 0.04 10*3/uL (ref 0.00–0.07)
Basophils Absolute: 0 10*3/uL (ref 0.0–0.1)
Basophils Relative: 1 %
Eosinophils Absolute: 0.1 10*3/uL (ref 0.0–0.5)
Eosinophils Relative: 1 %
HCT: 45.4 % (ref 36.0–46.0)
Hemoglobin: 15.1 g/dL — ABNORMAL HIGH (ref 12.0–15.0)
Immature Granulocytes: 1 %
Lymphocytes Relative: 34 %
Lymphs Abs: 1.8 10*3/uL (ref 0.7–4.0)
MCH: 30.2 pg (ref 26.0–34.0)
MCHC: 33.3 g/dL (ref 30.0–36.0)
MCV: 90.8 fL (ref 80.0–100.0)
Monocytes Absolute: 0.4 10*3/uL (ref 0.1–1.0)
Monocytes Relative: 8 %
Neutro Abs: 3 10*3/uL (ref 1.7–7.7)
Neutrophils Relative %: 55 %
Platelets: 102 10*3/uL — ABNORMAL LOW (ref 150–400)
RBC: 5 MIL/uL (ref 3.87–5.11)
RDW: 13.2 % (ref 11.5–15.5)
WBC: 5.4 10*3/uL (ref 4.0–10.5)
nRBC: 0 % (ref 0.0–0.2)

## 2019-07-04 LAB — BASIC METABOLIC PANEL
Anion gap: 13 (ref 5–15)
BUN: 13 mg/dL (ref 6–20)
CO2: 20 mmol/L — ABNORMAL LOW (ref 22–32)
Calcium: 9.2 mg/dL (ref 8.9–10.3)
Chloride: 104 mmol/L (ref 98–111)
Creatinine, Ser: 1.01 mg/dL — ABNORMAL HIGH (ref 0.44–1.00)
GFR calc Af Amer: >60 mL/min (ref >60)
GFR calc non Af Amer: >60 mL/min (ref >60)
Glucose, Bld: 87 mg/dL (ref 70–99)
Potassium: 4.1 mmol/L (ref 3.5–5.1)
Sodium: 137 mmol/L (ref 135–145)

## 2019-07-04 LAB — RESPIRATORY PANEL BY RT PCR (FLU A&B, COVID)
Influenza A by PCR: NEGATIVE
Influenza B by PCR: NEGATIVE
SARS Coronavirus 2 by RT PCR: NEGATIVE

## 2019-07-04 LAB — BRAIN NATRIURETIC PEPTIDE: B Natriuretic Peptide: 14 pg/mL (ref 0.0–100.0)

## 2019-07-04 MED ORDER — PREDNISONE 10 MG PO TABS: 10.0000 mg | ORAL_TABLET | Freq: Every day | ORAL | 0 refills | Status: DC

## 2019-07-04 NOTE — ED Provider Notes (Signed)
-----------------------------------------   9:11 AM on 07/04/2019 -----------------------------------------  Patient's work-up is largely nonrevealing.  Patient appears well, states she is feeling well.  100% room air saturation during my repeat evaluation.  Chest x-ray is clear, lab work is reassuring including a negative troponin.  Covid test is negative.  We will discharge the patient with a taper of steroids for her likely COPD exacerbation.   Minna Antis, MD 07/04/19 [phone]

## 2019-07-04 NOTE — ED Notes (Signed)
Recollect of light green and lavender sent to lab.  Pt has removed all cardiac leads, pulse ox, and BP cuff. convinced pt to keep pulse ox on since she's here for SOB.

## 2019-07-04 NOTE — ED Provider Notes (Addendum)
[facility] Emergency Department Provider Note       Time seen: ----------------------------------------- 6:38 AM on 07/04/2019 -----------------------------------------   I have reviewed the triage vital signs and the nursing notes.  HISTORY Chief Complaint Shortness of Breath    HPI Monique Edwards is a 54 y.o. female with a history of COPD, GERD, hypertension, major depressive disorder who presents to the ED for shortness of breath.  Patient arrives by EMS for same.  According to EMS she had clear lung sounds, was given 1 DuoNeb in route.  She arrives 100% on room air.  She is not complaining of any pain or recent illness.  Reported recent stressors.  Past Medical History:  Diagnosis Date  . COPD (chronic obstructive pulmonary disease) ([place])   . Fatty liver   . GERD (gastroesophageal reflux disease)   . Hypertension     Patient Active Problem List   Diagnosis Date Noted  . Elevated liver enzymes 04/28/2019  . Nausea 04/08/2019  . Thrombocytopenia ([place]) 03/21/2019  . Dysuria 03/18/2019  . History of COPD 03/18/2019  . Alcohol abuse 10/04/2018  . Benzodiazepine overdose 10/04/2018  . MDD (major depressive disorder), recurrent severe, without psychosis ([place]) 10/03/2018  . MDD (major depressive disorder), severe ([place]) 10/03/2018  . COPD exacerbation ([place]) 07/31/2018  . Acute exacerbation of chronic bronchitis ([place]) 03/13/2018  . Fatty liver 03/13/2018  . Tobacco use disorder 03/13/2018  . Post-nasal drip 02/27/2018  . Morbid obesity ([place]) 10/22/2016  . GERD without esophagitis 10/22/2016  . Chronic pain of right knee 10/16/2016  . Paraspinal muscle spasm 10/16/2016  . Hypertension 10/16/2016  . Fall (on) (from) other stairs and steps, subsequent encounter 07/19/2016    Past Surgical History:  Procedure Laterality Date  . CHOLECYSTECTOMY    . ESOPHAGOGASTRODUODENOSCOPY (EGD) WITH PROPOFOL N/A 06/06/2018   Procedure: ESOPHAGOGASTRODUODENOSCOPY  (EGD) WITH PROPOFOL;  Surgeon: Jonathon Bellows, MD;  Location: [facility] ENDOSCOPY;  Service: Gastroenterology;  Laterality: N/A;    Allergies Amlodipine and Lisinopril  Social History Social History   Tobacco Use  . Smoking status: Current Some Day Smoker    Packs/day: 1.00    Years: 16.00    Pack years: 16.00    Types: Cigarettes    Start date: 11/04/1980  . Smokeless tobacco: Never Used  . Tobacco comment: occasionally 3 cigs daily--ms  Substance Use Topics  . Alcohol use: No    Comment: former occassional  . Drug use: Not Currently    Types: Cocaine    Review of Systems Constitutional: Negative for fever. Cardiovascular: Negative for chest pain. Respiratory: Positive for shortness of breath Gastrointestinal: Negative for abdominal pain, vomiting and diarrhea. Musculoskeletal: Negative for back pain. Skin: Negative for rash. Neurological: Negative for headaches, focal weakness or numbness.  All systems negative/normal/unremarkable except as stated in the HPI  ____________________________________________   PHYSICAL EXAM:  VITAL SIGNS: ED Triage Vitals [07/04/19 0638]  Enc Vitals Group     BP      Pulse      Resp      Temp      Temp src      SpO2      Weight 190 lb (86.2 kg)     Height 5\' 2"  (1.575 m)     Head Circumference      Peak Flow      Pain Score 0     Pain Loc      Pain Edu?      Excl. in [place]?  Constitutional: Alert and oriented. Well appearing and in no distress. Eyes: Conjunctivae are normal. Normal extraocular movements. Cardiovascular: Normal rate, regular rhythm. No murmurs, rubs, or gallops. Respiratory: Normal respiratory effort without tachypnea nor retractions. Breath sounds are clear and equal bilaterally. No wheezes/rales/rhonchi. Gastrointestinal: Soft and nontender. Normal bowel sounds Musculoskeletal: Nontender with normal range of motion in extremities. No lower extremity tenderness nor edema. Neurologic:  Normal speech and language. No  gross focal neurologic deficits are appreciated.  Skin:  Skin is warm, dry and intact. No rash noted. Psychiatric: Depressed mood and affect ____________________________________________  ED COURSE:  As part of my medical decision making, I reviewed the following data within the electronic MEDICAL RECORD NUMBER History obtained from family if available, nursing notes, old chart and ekg, as well as notes from prior ED visits. Patient presented for dyspnea, we will assess with labs and imaging as indicated at this time.  EKG: Interpreted by me, sinus rhythm rate of 64 bpm, low voltage, normal axis, normal QT   Procedures  Eupha Lobb was evaluated in Emergency Department on 07/04/2019 for the symptoms described in the history of present illness. She was evaluated in the context of the global COVID-19 pandemic, which necessitated consideration that the patient might be at risk for infection with the SARS-CoV-2 virus that causes COVID-19. Institutional protocols and algorithms that pertain to the evaluation of patients at risk for COVID-19 are in a state of rapid change based on information released by regulatory bodies including the CDC and federal and state organizations. These policies and algorithms were followed during the patient's care in the ED.  ____________________________________________   LABS (pertinent positives/negatives)  Labs Reviewed  CBC WITH DIFFERENTIAL/PLATELET  BASIC METABOLIC PANEL  BRAIN NATRIURETIC PEPTIDE  TROPONIN I (HIGH SENSITIVITY)    RADIOLOGY  Chest x-ray is pending at this time  ____________________________________________   DIFFERENTIAL DIAGNOSIS   Anxiety, CHF, COPD, pneumonia, COVID-19  FINAL ASSESSMENT AND PLAN  Dyspnea   Plan: The patient had presented for shortness of breath.  Labs and imaging are pending at this time, anticipate discharge for what appears to be anxiety on arrival.   [name], MD    Note: This note was generated  in part or whole with voice recognition software. Voice recognition is usually quite accurate but there are transcription errors that can and very often do occur. I apologize for any typographical errors that were not detected and corrected.     Emily Filbert, MD 07/04/19 1610    Emily Filbert, MD 07/04/19 [phone]

## 2019-07-04 NOTE — ED Triage Notes (Signed)
Pt arrives with [facility] from home with c/o SOB last night. Per EMS, pt had clear lung sounds and was given one duoneb in route. EMS also reports 100% RA, 110/61, and PR in 60's. Pt is c/o of no pain at this time and is in NAD.

## 2019-07-16 ENCOUNTER — Ambulatory Visit: Payer: Self-pay | Admitting: Gerontology

## 2019-08-05 ENCOUNTER — Ambulatory Visit: Payer: Self-pay | Admitting: Gerontology

## 2019-08-18 ENCOUNTER — Ambulatory Visit: Payer: Self-pay | Admitting: Gerontology

## 2019-08-19 ENCOUNTER — Ambulatory Visit: Payer: Self-pay | Admitting: Gerontology

## 2019-08-19 ENCOUNTER — Other Ambulatory Visit: Payer: Self-pay

## 2019-08-19 ENCOUNTER — Encounter: Payer: Self-pay | Admitting: Gerontology

## 2019-08-19 VITALS — BP 118/82 | HR 84 | Temp 97.9°F | Ht 63.0 in | Wt 186.5 lb

## 2019-08-19 DIAGNOSIS — K219 Gastro-esophageal reflux disease without esophagitis: Secondary | ICD-10-CM

## 2019-08-19 DIAGNOSIS — R300 Dysuria: Secondary | ICD-10-CM

## 2019-08-19 DIAGNOSIS — Z8709 Personal history of other diseases of the respiratory system: Secondary | ICD-10-CM

## 2019-08-19 DIAGNOSIS — I10 Essential (primary) hypertension: Secondary | ICD-10-CM

## 2019-08-19 DIAGNOSIS — K029 Dental caries, unspecified: Secondary | ICD-10-CM

## 2019-08-19 DIAGNOSIS — K140 Glossitis: Secondary | ICD-10-CM | POA: Insufficient documentation

## 2019-08-19 MED ORDER — LOSARTAN POTASSIUM 25 MG PO TABS: 25.0000 mg | ORAL_TABLET | Freq: Every day | ORAL | 4 refills | Status: DC

## 2019-08-19 MED ORDER — FLUTICASONE-SALMETEROL 250-50 MCG/DOSE IN AEPB: 1.0000 | INHALATION_SPRAY | Freq: Two times a day (BID) | RESPIRATORY_TRACT | 3 refills | Status: DC

## 2019-08-19 MED ORDER — LIDOCAINE VISCOUS HCL 2 % MT SOLN: 15.0000 mL | OROMUCOSAL | 0 refills | Status: DC | PRN

## 2019-08-19 MED ORDER — ALBUTEROL SULFATE HFA 108 (90 BASE) MCG/ACT IN AERS: 2.0000 | INHALATION_SPRAY | Freq: Four times a day (QID) | RESPIRATORY_TRACT | 2 refills | Status: DC | PRN

## 2019-08-19 MED ORDER — NYSTATIN 100000 UNIT/ML MT SUSP: 5.0000 mL | Freq: Four times a day (QID) | OROMUCOSAL | 0 refills | Status: DC

## 2019-08-19 MED ORDER — OMEPRAZOLE 20 MG PO CPDR: 20.0000 mg | DELAYED_RELEASE_CAPSULE | Freq: Every day | ORAL | 4 refills | Status: DC

## 2019-08-19 MED ORDER — INCRUSE ELLIPTA 62.5 MCG/INH IN AEPB: 1.0000 | INHALATION_SPRAY | Freq: Every day | RESPIRATORY_TRACT | 3 refills | Status: DC

## 2019-08-19 NOTE — Progress Notes (Signed)
Established Patient Office Visit  Subjective:  Patient ID: Monique Edwards, female    DOB: 1965/12/19  Age: 54 y.o. MRN: [id number]  CC:  Chief Complaint  Patient presents with  . Follow-up    pt notes lumps on tongue and pain w/ urination    HPI Monique Edwards presents for c/o lumps on tongue, dysuria and medication refill. She was seen at the ED for COPD exacerbation on 07/04/2019,and was treated with steroid taper.  She states that her breathing is stable, denies wheezing and chest tightness.  She states that she has been having red bumps at the base of her tongue, and whitish color to her tongue, and complains of dysphagia. She states that it started 3 weeks ago.  She also c/o dysuria, urinary frequency ,urgency and pelvic pain, but denies flank pain, hematuria, fever nor chills.  She also complains of tooth decay to right lower molar.  Overall, she states that she is doing well and offers no further complaint.  Past Medical History:  Diagnosis Date  . COPD (chronic obstructive pulmonary disease) ([place])   . Fatty liver   . GERD (gastroesophageal reflux disease)   . Hypertension     Past Surgical History:  Procedure Laterality Date  . CHOLECYSTECTOMY    . ESOPHAGOGASTRODUODENOSCOPY (EGD) WITH PROPOFOL N/A 06/06/2018   Procedure: ESOPHAGOGASTRODUODENOSCOPY (EGD) WITH PROPOFOL;  Surgeon: Jonathon Bellows, MD;  Location: [facility] ENDOSCOPY;  Service: Gastroenterology;  Laterality: N/A;    Family History  Problem Relation Age of Onset  . Hypertension Mother   . Stroke Mother   . Diabetes Father   . Arthritis Sister   . Thyroid disease Sister   . Hypertension Brother   . Hypertension Brother     Social History   Socioeconomic History  . Marital status: Widowed    Spouse name: Not on file  . Number of children: Not on file  . Years of education: Not on file  . Highest education level: Not on file  Occupational History  . Not on file  Tobacco Use  . Smoking status: Current Some Day  Smoker    Packs/day: 1.00    Years: 16.00    Pack years: 16.00    Types: Cigarettes    Start date: 11/04/1980  . Smokeless tobacco: Never Used  . Tobacco comment: occasionally 3 cigs daily--ms  Substance and Sexual Activity  . Alcohol use: No    Comment: former occassional  . Drug use: Not Currently    Types: Cocaine  . Sexual activity: Not on file  Other Topics Concern  . Not on file  Social History Narrative  . Not on file   Social Determinants of Health   Financial Resource Strain:   . Difficulty of Paying Living Expenses:   Food Insecurity:   . Worried About Charity fundraiser in the Last Year:   . Arboriculturist in the Last Year:   Transportation Needs:   . Film/video editor (Medical):   Marland Kitchen Lack of Transportation (Non-Medical):   Physical Activity:   . Days of Exercise per Week:   . Minutes of Exercise per Session:   Stress:   . Feeling of Stress :   Social Connections:   . Frequency of Communication with Friends and Family:   . Frequency of Social Gatherings with Friends and Family:   . Attends Religious Services:   . Active Member of Clubs or Organizations:   . Attends Archivist Meetings:   .  Marital Status:   Intimate Partner Violence:   . Fear of Current or Ex-Partner:   . Emotionally Abused:   Marland Kitchen Physically Abused:   . Sexually Abused:     Outpatient Medications Prior to Visit  Medication Sig Dispense Refill  . Blood Pressure Monitor KIT 1 kit by Does not apply route daily. 1 kit 0  . cetirizine (ZYRTEC) 10 MG tablet Take 10 mg by mouth daily.    . ferrous sulfate 325 (65 FE) MG EC tablet Take 325 mg by mouth daily with breakfast.    . naproxen sodium (ALEVE) 220 MG tablet Take 440 mg by mouth daily as needed (Knee/hip pain).    Marland Kitchen albuterol (VENTOLIN HFA) 108 (90 Base) MCG/ACT inhaler Inhale 2 puffs into the lungs every 6 (six) hours as needed for wheezing or shortness of breath. 18 g 2  . Fluticasone-Salmeterol (ADVAIR DISKUS) 250-50  MCG/DOSE AEPB Inhale 1 puff into the lungs 2 (two) times daily. 1 puff twice daiy. Rinse mouth after use. 180 each 3  . losartan (COZAAR) 25 MG tablet Take 1 tablet (25 mg total) by mouth daily. 30 tablet 4  . omeprazole (PRILOSEC) 20 MG capsule Take 1 capsule (20 mg total) by mouth daily. 30 capsule 4  . umeclidinium bromide (INCRUSE ELLIPTA) 62.5 MCG/INH AEPB Inhale 1 puff into the lungs daily. 1 each 3  . guaifenesin (ROBITUSSIN) 100 MG/5ML syrup Take 5 mLs (100 mg total) by mouth 3 (three) times daily as needed for cough. (Patient not taking: Reported on 08/19/2019) 240 mL 0  . predniSONE (DELTASONE) 10 MG tablet Take 1 tablet (10 mg total) by mouth daily. Day 1-3: take 4 tablets PO daily Day 4-6: take 3 tablets PO daily Day 7-9: take 2 tablets PO daily Day 10-12: take 1 tablet PO daily (Patient not taking: Reported on 08/19/2019) 30 tablet 0   No facility-administered medications prior to visit.    Allergies  Allergen Reactions  . Amlodipine Swelling    Peripheral edema  . Lisinopril Cough    ROS Review of Systems  Constitutional: Negative.   Respiratory: Negative.   Cardiovascular: Negative.   Genitourinary: Positive for dysuria, frequency and urgency. Negative for flank pain.  Neurological: Negative.   Psychiatric/Behavioral: Negative.       Objective:    Physical Exam  Constitutional: She is oriented to person, place, and time. She appears well-developed.  HENT:  Head: Normocephalic.  Mouth/Throat: No tonsillar abscesses.    Cardiovascular: Normal rate and regular rhythm.  Pulmonary/Chest: Effort normal and breath sounds normal.  Neurological: She is alert and oriented to person, place, and time.  Psychiatric: She has a normal mood and affect. Her behavior is normal. Judgment and thought content normal.    BP 118/82 (BP Location: Left Arm, Patient Position: Sitting, Cuff Size: Small)   Pulse 84   Temp 97.9 F (36.6 C)   Ht 5' 3" (1.6 m)   Wt 186 lb 8 oz (84.6  kg)   LMP 02/28/2014 (Approximate)   SpO2 97%   BMI 33.04 kg/m  Wt Readings from Last 3 Encounters:  08/19/19 186 lb 8 oz (84.6 kg)  07/04/19 190 lb (86.2 kg)  06/04/19 198 lb (89.8 kg)     Health Maintenance Due  Topic Date Due  . HIV Screening  Never done  . TETANUS/TDAP  Never done  . COLONOSCOPY  Never done    There are no preventive care reminders to display for this patient.  Lab Results  Component  Value Date   TSH 2.130 03/19/2018   Lab Results  Component Value Date   WBC 5.4 07/04/2019   HGB 15.1 (H) 07/04/2019   HCT 45.4 07/04/2019   MCV 90.8 07/04/2019   PLT 102 (L) 07/04/2019   Lab Results  Component Value Date   NA 137 07/04/2019   K 4.1 07/04/2019   CO2 20 (L) 07/04/2019   GLUCOSE 87 07/04/2019   BUN 13 07/04/2019   CREATININE 1.01 (H) 07/04/2019   BILITOT 0.9 03/11/2019   ALKPHOS 68 03/11/2019   AST 54 (H) 03/11/2019   ALT 43 (H) 03/11/2019   PROT 7.3 03/11/2019   ALBUMIN 4.6 03/11/2019   CALCIUM 9.2 07/04/2019   ANIONGAP 13 07/04/2019   Lab Results  Component Value Date   CHOL 168 03/11/2019   Lab Results  Component Value Date   HDL 76 03/11/2019   Lab Results  Component Value Date   LDLCALC 77 03/11/2019   Lab Results  Component Value Date   TRIG 81 03/11/2019   Lab Results  Component Value Date   CHOLHDL 2.2 03/11/2019   Lab Results  Component Value Date   HGBA1C 5.0 03/11/2019      Assessment & Plan:    1. History of COPD -Her COPD is under control and she will continue on current treatment regimen. - albuterol (VENTOLIN HFA) 108 (90 Base) MCG/ACT inhaler; Inhale 2 puffs into the lungs every 6 (six) hours as needed for wheezing or shortness of breath.  Dispense: 18 g; Refill: 2 - Fluticasone-Salmeterol (ADVAIR DISKUS) 250-50 MCG/DOSE AEPB; Inhale 1 puff into the lungs 2 (two) times daily. 1 puff twice daiy. Rinse mouth after use.  Dispense: 180 each; Refill: 3 - umeclidinium bromide (INCRUSE ELLIPTA) 62.5 MCG/INH  AEPB; Inhale 1 puff into the lungs daily.  Dispense: 1 each; Refill: 3  2. Essential hypertension -Her blood pressure is under control and she will continue on current treatment regimen. -Low salt DASH diet -Take medications regularly on time -Exercise regularly as tolerated -Check blood pressure at least once a week at home or a nearby pharmacy and record -Goal is less than 140/90 and normal blood pressure is 120/80    3. GERD without esophagitis -Her acid reflux is under control and she will continue on current treatment regimen. -Low fat Diet, like low fat dairy products eg skimmed milk -Avoid any fried food -Regular exercise/walk -Goal for Total Cholesterol is less than 200 -Goal for bad cholesterol LDL is less than 70 -Goal for Good cholesterol HDL is more than 45 -Goal for Triglyceride is less than 150 - omeprazole (PRILOSEC) 20 MG capsule; Take 1 capsule (20 mg total) by mouth daily.  Dispense: 30 capsule; Refill: 4  4. Tongue ulcer - lidocaine (XYLOCAINE) 2 % solution; Use as directed 15 mLs in the mouth or throat as needed for mouth pain.  Dispense: 100 mL; Refill: 0 - nystatin (MYCOSTATIN) 100000 UNIT/ML suspension; Take 5 mLs (500,000 Units total) by mouth 4 (four) times daily.  Dispense: 60 mL; Refill: 0  5. Dental decay - Dental referral  6. Dysuria - Urine sample to r/o UTI - Urinalysis; Future - UA/M w/rflx Culture, Routine; Future   Follow-up: Return in about 29 days (around 09/17/2019).    Rada Zegers Jerold Coombe, NP

## 2019-08-20 ENCOUNTER — Other Ambulatory Visit: Payer: Self-pay

## 2019-08-20 DIAGNOSIS — R300 Dysuria: Secondary | ICD-10-CM

## 2019-08-24 LAB — UA/M W/RFLX CULTURE, ROUTINE
Bilirubin, UA: NEGATIVE
Glucose, UA: NEGATIVE
Ketones, UA: NEGATIVE
Nitrite, UA: POSITIVE — AB
Protein,UA: NEGATIVE
RBC, UA: NEGATIVE
Specific Gravity, UA: 1.009 (ref 1.005–1.030)
Urobilinogen, Ur: 0.2 mg/dL (ref 0.2–1.0)
pH, UA: 5 (ref 5.0–7.5)

## 2019-08-24 LAB — MICROSCOPIC EXAMINATION
Casts: NONE SEEN /lpf
Epithelial Cells (non renal): NONE SEEN /hpf (ref 0–10)
RBC, Urine: NONE SEEN /hpf (ref 0–2)

## 2019-08-24 LAB — URINE CULTURE, REFLEX

## 2019-08-26 ENCOUNTER — Other Ambulatory Visit: Payer: Self-pay | Admitting: Gerontology

## 2019-08-26 DIAGNOSIS — N390 Urinary tract infection, site not specified: Secondary | ICD-10-CM

## 2019-08-26 MED ORDER — AMOXICILLIN-POT CLAVULANATE 875-125 MG PO TABS: 1.0000 | ORAL_TABLET | Freq: Two times a day (BID) | ORAL | 0 refills | Status: DC

## 2019-08-26 MED ORDER — PHENAZOPYRIDINE HCL 100 MG PO TABS: 100.0000 mg | ORAL_TABLET | Freq: Three times a day (TID) | ORAL | 0 refills | Status: DC | PRN

## 2019-09-01 ENCOUNTER — Telehealth: Payer: Self-pay | Admitting: Gerontology

## 2019-09-01 NOTE — Telephone Encounter (Signed)
Called pt at 3:25pm, but pt could not be reached in order to remind her to pick up medication from pharmacy - Monique Edwards

## 2019-09-02 ENCOUNTER — Ambulatory Visit: Payer: Self-pay | Admitting: Gerontology

## 2019-09-02 ENCOUNTER — Telehealth: Payer: Self-pay | Admitting: Gerontology

## 2019-09-03 ENCOUNTER — Telehealth: Payer: Self-pay | Admitting: Pharmacist

## 2019-09-03 NOTE — Telephone Encounter (Signed)
09/03/2019 2:44:13 PM - Refills online with [facility]Vent[facility],Advair&Incruse -- Rhetta Mura - Thursday, September 03, 2019 2:42 PM -- Refills online with [facility] for Ventolin, Incruse Ellipta 62.5, & Advair 250/50 to ship 09/16/2019, order# [id number].

## 2019-09-07 ENCOUNTER — Other Ambulatory Visit: Payer: Self-pay

## 2019-09-07 ENCOUNTER — Ambulatory Visit: Payer: Self-pay | Admitting: Pharmacy Technician

## 2019-09-07 DIAGNOSIS — Z79899 Other long term (current) drug therapy: Secondary | ICD-10-CM

## 2019-09-07 NOTE — Progress Notes (Signed)
Completed [facility] application for re-certification  Patient will sign when bringing 2020 Federal Tax Return and Unemployment Verification Letter.  Patient verbally acknowledged that she understood that failure to provide this requested financial documentation by 10/06/19 would result in a halt in medication assistance by [facility].    [name] Care Manager [facility]

## 2019-09-15 ENCOUNTER — Other Ambulatory Visit: Payer: Self-pay

## 2019-09-15 ENCOUNTER — Ambulatory Visit: Payer: Self-pay | Admitting: Gerontology

## 2019-09-17 ENCOUNTER — Ambulatory Visit: Payer: Self-pay | Admitting: Gerontology

## 2019-09-24 ENCOUNTER — Other Ambulatory Visit: Payer: Self-pay

## 2019-09-24 ENCOUNTER — Ambulatory Visit: Payer: Self-pay | Admitting: Gerontology

## 2019-09-24 VITALS — BP 124/81 | HR 65 | Temp 97.3°F | Resp 16 | Wt 191.8 lb

## 2019-09-24 DIAGNOSIS — K140 Glossitis: Secondary | ICD-10-CM

## 2019-09-24 NOTE — Progress Notes (Signed)
Established Patient Office Visit  Subjective:  Patient ID: Monique Edwards, female    DOB: 1966-02-09  Age: 54 y.o. MRN: [id number]  CC: No chief complaint on file.   HPI Monique Edwards presents for follow up of ulcer to base of tongue, UTI and medication refill. Currently, she states that the ulcer to her tongue has improved 75%,though she continues to experience intermittent discomfort with drinking hot beverage. She denies fever, dysphagia, odynophagia and lymphadenopathy. She states that her breathing is stable and continues to use her inhalers. She completed her antibiotic course for UTI and she denies dysuria, urinary frequency and urgency. She states that she's compliant with her medications, and continues to make lifestyle modifications. Overall, she states that she's doing well and offers no further complaint.       Past Medical History:  Diagnosis Date  . COPD (chronic obstructive pulmonary disease) ([place])   . Fatty liver   . GERD (gastroesophageal reflux disease)   . Hypertension     Past Surgical History:  Procedure Laterality Date  . CHOLECYSTECTOMY    . ESOPHAGOGASTRODUODENOSCOPY (EGD) WITH PROPOFOL N/A 06/06/2018   Procedure: ESOPHAGOGASTRODUODENOSCOPY (EGD) WITH PROPOFOL;  Surgeon: Jonathon Bellows, MD;  Location: [facility] ENDOSCOPY;  Service: Gastroenterology;  Laterality: N/A;    Family History  Problem Relation Age of Onset  . Hypertension Mother   . Stroke Mother   . Diabetes Father   . Arthritis Sister   . Thyroid disease Sister   . Hypertension Brother   . Hypertension Brother     Social History   Socioeconomic History  . Marital status: Widowed    Spouse name: Not on file  . Number of children: Not on file  . Years of education: Not on file  . Highest education level: Not on file  Occupational History  . Not on file  Tobacco Use  . Smoking status: Current Some Day Smoker    Packs/day: 1.00    Years: 16.00    Pack years: 16.00    Types: Cigarettes     Start date: 11/04/1980  . Smokeless tobacco: Never Used  . Tobacco comment: occasionally 3 cigs daily--ms  Substance and Sexual Activity  . Alcohol use: No    Comment: former occassional  . Drug use: Not Currently    Types: Cocaine  . Sexual activity: Not on file  Other Topics Concern  . Not on file  Social History Narrative  . Not on file   Social Determinants of Health   Financial Resource Strain:   . Difficulty of Paying Living Expenses:   Food Insecurity:   . Worried About Charity fundraiser in the Last Year:   . Arboriculturist in the Last Year:   Transportation Needs:   . Film/video editor (Medical):   Marland Kitchen Lack of Transportation (Non-Medical):   Physical Activity:   . Days of Exercise per Week:   . Minutes of Exercise per Session:   Stress:   . Feeling of Stress :   Social Connections:   . Frequency of Communication with Friends and Family:   . Frequency of Social Gatherings with Friends and Family:   . Attends Religious Services:   . Active Member of Clubs or Organizations:   . Attends Archivist Meetings:   Marland Kitchen Marital Status:   Intimate Partner Violence:   . Fear of Current or Ex-Partner:   . Emotionally Abused:   Marland Kitchen Physically Abused:   . Sexually Abused:  Outpatient Medications Prior to Visit  Medication Sig Dispense Refill  . albuterol (VENTOLIN HFA) 108 (90 Base) MCG/ACT inhaler Inhale 2 puffs into the lungs every 6 (six) hours as needed for wheezing or shortness of breath. 18 g 2  . Blood Pressure Monitor KIT 1 kit by Does not apply route daily. 1 kit 0  . cetirizine (ZYRTEC) 10 MG tablet Take 10 mg by mouth daily.    . ferrous sulfate 325 (65 FE) MG EC tablet Take 325 mg by mouth daily with breakfast.    . Fluticasone-Salmeterol (ADVAIR DISKUS) 250-50 MCG/DOSE AEPB Inhale 1 puff into the lungs 2 (two) times daily. 1 puff twice daiy. Rinse mouth after use. 180 each 3  . losartan (COZAAR) 25 MG tablet Take 1 tablet (25 mg total) by mouth  daily. 30 tablet 4  . naproxen sodium (ALEVE) 220 MG tablet Take 440 mg by mouth daily as needed (Knee/hip pain).    Marland Kitchen omeprazole (PRILOSEC) 20 MG capsule Take 1 capsule (20 mg total) by mouth daily. 30 capsule 4  . umeclidinium bromide (INCRUSE ELLIPTA) 62.5 MCG/INH AEPB Inhale 1 puff into the lungs daily. 1 each 3  . amoxicillin-clavulanate (AUGMENTIN) 875-125 MG tablet Take 1 tablet by mouth 2 (two) times daily. 20 tablet 0  . lidocaine (XYLOCAINE) 2 % solution Use as directed 15 mLs in the mouth or throat as needed for mouth pain. 100 mL 0  . nystatin (MYCOSTATIN) 100000 UNIT/ML suspension Take 5 mLs (500,000 Units total) by mouth 4 (four) times daily. 60 mL 0  . phenazopyridine (PYRIDIUM) 100 MG tablet Take 1 tablet (100 mg total) by mouth 3 (three) times daily as needed for pain. 10 tablet 0   No facility-administered medications prior to visit.    Allergies  Allergen Reactions  . Amlodipine Swelling    Peripheral edema  . Lisinopril Cough    ROS Review of Systems  Constitutional: Negative.   Respiratory: Negative.   Cardiovascular: Negative.   Genitourinary: Negative.   Neurological: Negative.   Psychiatric/Behavioral: Negative.       Objective:    Physical Exam  Constitutional: She is oriented to person, place, and time. She appears well-developed.  HENT:  Head: Normocephalic and atraumatic.  Mouth/Throat:    Cardiovascular: Normal rate and regular rhythm.  Pulmonary/Chest: Effort normal and breath sounds normal.  Neurological: She is alert and oriented to person, place, and time.  Psychiatric: She has a normal mood and affect. Her behavior is normal. Thought content normal.    BP 124/81 (BP Location: Right Arm, Patient Position: Sitting, Cuff Size: Large)   Pulse 65   Temp (!) 97.3 F (36.3 C)   Resp 16   Wt 191 lb 12.8 oz (87 kg)   LMP 02/28/2014 (Approximate)   SpO2 97%   BMI 33.98 kg/m  Wt Readings from Last 3 Encounters:  09/24/19 191 lb 12.8 oz  (87 kg)  08/19/19 186 lb 8 oz (84.6 kg)  07/04/19 190 lb (86.2 kg)   She was encouraged to continue on her weight loss regimen.  Health Maintenance Due  Topic Date Due  . COVID-19 Vaccine (1) Never done  . HIV Screening  Never done  . TETANUS/TDAP  Never done  . COLONOSCOPY  Never done    There are no preventive care reminders to display for this patient.  Lab Results  Component Value Date   TSH 2.130 03/19/2018   Lab Results  Component Value Date   WBC 5.4 07/04/2019  HGB 15.1 (H) 07/04/2019   HCT 45.4 07/04/2019   MCV 90.8 07/04/2019   PLT 102 (L) 07/04/2019   Lab Results  Component Value Date   NA 137 07/04/2019   K 4.1 07/04/2019   CO2 20 (L) 07/04/2019   GLUCOSE 87 07/04/2019   BUN 13 07/04/2019   CREATININE 1.01 (H) 07/04/2019   BILITOT 0.9 03/11/2019   ALKPHOS 68 03/11/2019   AST 54 (H) 03/11/2019   ALT 43 (H) 03/11/2019   PROT 7.3 03/11/2019   ALBUMIN 4.6 03/11/2019   CALCIUM 9.2 07/04/2019   ANIONGAP 13 07/04/2019   Lab Results  Component Value Date   CHOL 168 03/11/2019   Lab Results  Component Value Date   HDL 76 03/11/2019   Lab Results  Component Value Date   LDLCALC 77 03/11/2019   Lab Results  Component Value Date   TRIG 81 03/11/2019   Lab Results  Component Value Date   CHOLHDL 2.2 03/11/2019   Lab Results  Component Value Date   HGBA1C 5.0 03/11/2019      Assessment & Plan:    1. Tongue ulcer - She was advised to perform proper oral hygiene, use non alcohol mouth wash and increase vegetable and fruits intake. She is to contact the clinic for worsening symptoms.   Follow-up: Return in about 2 months (around 11/24/2019), or if symptoms worsen or fail to improve.    Maydell Knoebel Jerold Coombe, NP

## 2019-09-29 ENCOUNTER — Other Ambulatory Visit: Payer: Self-pay | Admitting: Urology

## 2019-10-01 ENCOUNTER — Telehealth: Payer: Self-pay | Admitting: Pharmacy Technician

## 2019-10-01 NOTE — Telephone Encounter (Signed)
Received updated proof of income.  Patient eligible to receive medication assistance at [facility] until time for re-certification in 9359, and as long as eligibility requirements continue to be met.  [place] [facility]

## 2019-10-06 ENCOUNTER — Telehealth: Payer: Self-pay

## 2019-10-06 NOTE — Telephone Encounter (Signed)
Left voicemail on home number for pt to call clinic. Cell number does not identify patient.  Patient needs medicaid denial letter to process [employer] application.

## 2019-10-12 ENCOUNTER — Ambulatory Visit: Payer: Self-pay

## 2019-10-13 ENCOUNTER — Telehealth: Payer: Self-pay

## 2019-10-13 NOTE — Telephone Encounter (Signed)
LVM for patient to call clinic.  For [employer] application, we need 3 months of bank statements for account listed on her tax return.

## 2019-10-30 ENCOUNTER — Ambulatory Visit: Payer: Self-pay | Admitting: Pulmonary Disease

## 2019-11-19 ENCOUNTER — Ambulatory Visit: Payer: Self-pay

## 2019-11-24 ENCOUNTER — Ambulatory Visit: Payer: Self-pay | Admitting: Gerontology

## 2019-11-30 ENCOUNTER — Telehealth: Payer: Self-pay | Admitting: Pharmacist

## 2019-11-30 NOTE — Telephone Encounter (Signed)
11/30/2019 3:50:33 PM - [facility] refills Advair,Ventolin&Incruse Ellipta  -- Rhetta Mura - Monday, November 30, 2019 3:49 PM --Placed refill online with [facility] for Advair 250/50, Ventolin & Incruse Ellipta 62.5-to ship 12/11/2019, order# [id number].

## 2019-12-01 ENCOUNTER — Ambulatory Visit: Payer: Self-pay | Admitting: Gerontology

## 2019-12-03 ENCOUNTER — Ambulatory Visit: Payer: Self-pay | Admitting: Gerontology

## 2019-12-10 ENCOUNTER — Ambulatory Visit: Payer: Self-pay | Admitting: Gerontology

## 2019-12-29 ENCOUNTER — Telehealth: Payer: Self-pay | Admitting: Primary Care

## 2019-12-29 NOTE — Telephone Encounter (Signed)
This is a former Dr. Nicholos Johns patient.  I have not seen her previously.  I am reluctant to give her prednisone as I do not know rhexis process she is going through.  She definitely needs Covid testing and likely a chest x-ray.  Recommend urgent care or ED for evaluation.

## 2019-12-29 NOTE — Telephone Encounter (Signed)
Left message for patient

## 2019-12-29 NOTE — Telephone Encounter (Signed)
Patient is returning phone call. Patient phone number is [phone].

## 2019-12-29 NOTE — Telephone Encounter (Signed)
Called and spoke to patient.  Patient reports of increased sob with exertion and at rest, diarrhea, non prod cough, headache, sweats, runny nose and mild wheezing. Sx have been present for 2 days.  Using albuterol HFA Q4H with some relief in sx.  Patient has had one covid vaccine on 08/22/2019.  She did not get second shot.  Her nephew that lives with her is currently positive for covid.  I have recommended that patient get tested. Patient has been provided with contact number for testing site.  She is requesting a Rx for prednisone.    Dr. Jayme Cloud, please advise.

## 2019-12-29 NOTE — Telephone Encounter (Signed)
Patient is aware of below message and voiced her understanding.  Nothing further is needed at this time.  

## 2019-12-30 ENCOUNTER — Other Ambulatory Visit: Payer: Self-pay

## 2020-01-08 ENCOUNTER — Telehealth: Payer: Self-pay | Admitting: Primary Care

## 2020-01-08 MED ORDER — PREDNISONE 10 MG PO TABS: ORAL_TABLET | ORAL | 0 refills | Status: DC

## 2020-01-08 NOTE — Telephone Encounter (Signed)
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

## 2020-01-08 NOTE — Telephone Encounter (Signed)
She has had increased shortness of breath and dry cough for 1 week. Reports she had a negative covid test on 8/28 from [facility]. She is compliant with Incruse and Advair. She gets her medications from medication management. They closed today at 4pm and wont be open until Tuesday d/t holiday. We will send in prednisone taper to [facility] in [address].

## 2020-01-08 NOTE — Telephone Encounter (Signed)
Will forward to APP of day since Dr Reece Agar on PAL and Kasa not on until 7 pm

## 2020-01-12 ENCOUNTER — Ambulatory Visit: Payer: Self-pay | Admitting: Gerontology

## 2020-01-12 ENCOUNTER — Other Ambulatory Visit: Payer: Self-pay

## 2020-01-12 ENCOUNTER — Other Ambulatory Visit: Payer: Self-pay | Admitting: Gerontology

## 2020-01-12 ENCOUNTER — Encounter: Payer: Self-pay | Admitting: Gerontology

## 2020-01-12 VITALS — BP 127/83 | HR 66 | Temp 98.3°F | Resp 18 | Ht 63.0 in | Wt 182.2 lb

## 2020-01-12 DIAGNOSIS — Z0000 Encounter for general adult medical examination without abnormal findings: Secondary | ICD-10-CM

## 2020-01-12 DIAGNOSIS — K219 Gastro-esophageal reflux disease without esophagitis: Secondary | ICD-10-CM

## 2020-01-12 DIAGNOSIS — I10 Essential (primary) hypertension: Secondary | ICD-10-CM

## 2020-01-12 DIAGNOSIS — M25551 Pain in right hip: Secondary | ICD-10-CM

## 2020-01-12 DIAGNOSIS — M25562 Pain in left knee: Secondary | ICD-10-CM

## 2020-01-12 DIAGNOSIS — Z8709 Personal history of other diseases of the respiratory system: Secondary | ICD-10-CM

## 2020-01-12 MED ORDER — OMEPRAZOLE 20 MG PO CPDR: 20.0000 mg | DELAYED_RELEASE_CAPSULE | Freq: Every day | ORAL | 4 refills | Status: DC

## 2020-01-12 MED ORDER — LOSARTAN POTASSIUM 25 MG PO TABS: 25.0000 mg | ORAL_TABLET | Freq: Every day | ORAL | 4 refills | Status: DC

## 2020-01-12 MED ORDER — ALBUTEROL SULFATE HFA 108 (90 BASE) MCG/ACT IN AERS: 2.0000 | INHALATION_SPRAY | Freq: Four times a day (QID) | RESPIRATORY_TRACT | 2 refills | Status: DC | PRN

## 2020-01-12 MED ORDER — INCRUSE ELLIPTA 62.5 MCG/INH IN AEPB: 1.0000 | INHALATION_SPRAY | Freq: Every day | RESPIRATORY_TRACT | 3 refills | Status: DC

## 2020-01-12 NOTE — Progress Notes (Signed)
Established Patient Office Visit  Subjective:  Patient ID: Monique Edwards, female    DOB: 1965/05/12  Age: 54 y.o. MRN: [id number]  CC:  Chief Complaint  Patient presents with   Knee Pain   Follow-up    HPI Monique Edwards presents for follow up of medication refill and c/o right hip pain s/p fall a month ago. She states that she missed her steps while climbing down the steps at night. She states that she continues to exeprience intermittent pain when she lays on her right side, but it doesn't affect her walking. She states that she's being experiencing constant aching non radiating pain to her left knee, that has been going on for 4 months. She denies injury, states that applying analgesic cream and taking Aleve minimally relieves symptoms, but walking aggravates symptoms. She has a history of COPD, states that her breathing is normal and continues to use her inhaler. She received Prednisone taper for increased shortness of breath and dry cough on 01/08/2020 that was ordered by Kandy Garrison NP. Currently' she continues on the Prednisone taper, she denies shortness of breath and non productive cough. Overall she states that she's doing well and offers no further complaint.  Past Medical History:  Diagnosis Date   COPD (chronic obstructive pulmonary disease) ([place])    Fatty liver    GERD (gastroesophageal reflux disease)    Hypertension     Past Surgical History:  Procedure Laterality Date   CHOLECYSTECTOMY     ESOPHAGOGASTRODUODENOSCOPY (EGD) WITH PROPOFOL N/A 06/06/2018   Procedure: ESOPHAGOGASTRODUODENOSCOPY (EGD) WITH PROPOFOL;  Surgeon: Jonathon Bellows, MD;  Location: [facility] ENDOSCOPY;  Service: Gastroenterology;  Laterality: N/A;    Family History  Problem Relation Age of Onset   Hypertension Mother    Stroke Mother    Diabetes Father    Arthritis Sister    Thyroid disease Sister    Hypertension Brother    Hypertension Brother     Social History   Socioeconomic History    Marital status: Widowed    Spouse name: Not on file   Number of children: Not on file   Years of education: Not on file   Highest education level: Not on file  Occupational History   Occupation: Unemployed  Tobacco Use   Smoking status: Current Some Day Smoker    Packs/day: 1.00    Years: 16.00    Pack years: 16.00    Types: Cigarettes    Start date: 11/04/1980   Smokeless tobacco: Never Used   Tobacco comment: occasionally 3 cigs daily--ms  Vaping Use   Vaping Use: Never used  Substance and Sexual Activity   Alcohol use: Not Currently    Comment: former occassional   Drug use: Not Currently    Types: Cocaine   Sexual activity: Not on file  Other Topics Concern   Not on file  Social History Narrative   Not on file   Social Determinants of Health   Financial Resource Strain:    Difficulty of Paying Living Expenses: Not on file  Food Insecurity:    Worried About [place] in the Last Year: Not on file   [place] in the Last Year: Not on file  Transportation Needs:    Lack of Transportation (Medical): Not on file   Lack of Transportation (Non-Medical): Not on file  Physical Activity:    Days of Exercise per Week: Not on file   Minutes of Exercise per Session: Not on file  Stress:    Feeling of Stress : Not on file  Social Connections:    Frequency of Communication with Friends and Family: Not on file   Frequency of Social Gatherings with Friends and Family: Not on file   Attends Religious Services: Not on file   Active Member of Clubs or Organizations: Not on file   Attends Archivist Meetings: Not on file   Marital Status: Not on file  Intimate Partner Violence:    Fear of Current or Ex-Partner: Not on file   Emotionally Abused: Not on file   Physically Abused: Not on file   Sexually Abused: Not on file    Outpatient Medications Prior to Visit  Medication Sig Dispense Refill   Blood Pressure Monitor  KIT 1 kit by Does not apply route daily. 1 kit 0   Fluticasone-Salmeterol (ADVAIR DISKUS) 250-50 MCG/DOSE AEPB Inhale 1 puff into the lungs 2 (two) times daily. 1 puff twice daiy. Rinse mouth after use. 180 each 3   naproxen sodium (ALEVE) 220 MG tablet Take 440 mg by mouth daily as needed (Knee/hip pain).     predniSONE (DELTASONE) 10 MG tablet Take 4 tabs po daily x 2 days; then 3 tabs for 2 days; then 2 tabs for 2 days; then 1 tab for 2 days 20 tablet 0   albuterol (VENTOLIN HFA) 108 (90 Base) MCG/ACT inhaler Inhale 2 puffs into the lungs every 6 (six) hours as needed for wheezing or shortness of breath. 18 g 2   losartan (COZAAR) 25 MG tablet Take 1 tablet (25 mg total) by mouth daily. 30 tablet 4   omeprazole (PRILOSEC) 20 MG capsule Take 1 capsule (20 mg total) by mouth daily. 30 capsule 4   umeclidinium bromide (INCRUSE ELLIPTA) 62.5 MCG/INH AEPB Inhale 1 puff into the lungs daily. 1 each 3   cetirizine (ZYRTEC) 10 MG tablet Take 10 mg by mouth daily. (Patient not taking: Reported on 01/12/2020)     ferrous sulfate 325 (65 FE) MG EC tablet Take 325 mg by mouth daily with breakfast.     No facility-administered medications prior to visit.    Allergies  Allergen Reactions   Amlodipine Swelling    Peripheral edema   Lisinopril Cough    ROS Review of Systems  Constitutional: Negative.   Respiratory: Negative.   Cardiovascular: Negative.   Musculoskeletal: Positive for arthralgias (right hip pain) and neck pain (left knee pain).  Neurological: Negative.       Objective:    Physical Exam HENT:     Head: Normocephalic.  Cardiovascular:     Rate and Rhythm: Normal rate and regular rhythm.     Pulses: Normal pulses.     Heart sounds: Normal heart sounds.  Pulmonary:     Effort: Pulmonary effort is normal.     Breath sounds: Normal breath sounds.  Musculoskeletal:        General: Tenderness (palpation to lateral aspect of right thigh) present.  Skin:    General:  Skin is warm and dry.  Neurological:     General: No focal deficit present.     Mental Status: She is alert and oriented to person, place, and time. Mental status is at baseline.  Psychiatric:        Mood and Affect: Mood normal.        Behavior: Behavior normal.        Thought Content: Thought content normal.        Judgment: Judgment normal.  BP 127/83 (BP Location: Right Arm, Patient Position: Sitting, Cuff Size: Normal)    Pulse 66    Temp 98.3 F (36.8 C) (Temporal)    Resp 18    Ht _0  (1.6 m)    Wt 182 lb 3.2 oz (82.6 kg)    LMP 02/28/2014 (Approximate)    SpO2 99%    BMI 32.28 kg/m  Wt Readings from Last 3 Encounters:  01/12/20 182 lb 3.2 oz (82.6 kg)  09/24/19 191 lb 12.8 oz (87 kg)  08/19/19 186 lb 8 oz (84.6 kg)   She lost 9 pounds in 14 weeks, and was encouraged to continue on her weight loss regimen.  Health Maintenance Due  Topic Date Due   COVID-19 Vaccine (1) Never done   HIV Screening  Never done   TETANUS/TDAP  Never done   COLONOSCOPY  Never done   INFLUENZA VACCINE  12/06/2019    There are no preventive care reminders to display for this patient.  Lab Results  Component Value Date   TSH 2.130 03/19/2018   Lab Results  Component Value Date   WBC 5.4 07/04/2019   HGB 15.1 (H) 07/04/2019   HCT 45.4 07/04/2019   MCV 90.8 07/04/2019   PLT 102 (L) 07/04/2019   Lab Results  Component Value Date   NA 137 07/04/2019   K 4.1 07/04/2019   CO2 20 (L) 07/04/2019   GLUCOSE 87 07/04/2019   BUN 13 07/04/2019   CREATININE 1.01 (H) 07/04/2019   BILITOT 0.9 03/11/2019   ALKPHOS 68 03/11/2019   AST 54 (H) 03/11/2019   ALT 43 (H) 03/11/2019   PROT 7.3 03/11/2019   ALBUMIN 4.6 03/11/2019   CALCIUM 9.2 07/04/2019   ANIONGAP 13 07/04/2019   Lab Results  Component Value Date   CHOL 168 03/11/2019   Lab Results  Component Value Date   HDL 76 03/11/2019   Lab Results  Component Value Date   LDLCALC 77 03/11/2019   Lab Results  Component  Value Date   TRIG 81 03/11/2019   Lab Results  Component Value Date   CHOLHDL 2.2 03/11/2019   Lab Results  Component Value Date   HGBA1C 5.0 03/11/2019      Assessment & Plan:    1. History of COPD - Her COPD is under control, She will continue on current treatment regimen. - umeclidinium bromide (INCRUSE ELLIPTA) 62.5 MCG/INH AEPB; Inhale 1 puff into the lungs daily.  Dispense: 1 each; Refill: 3 - albuterol (VENTOLIN HFA) 108 (90 Base) MCG/ACT inhaler; Inhale 2 puffs into the lungs every 6 (six) hours as needed for wheezing or shortness of breath.  Dispense: 18 g; Refill: 2  2. GERD without esophagitis - Her acid reflux is under control and she will continue on current treatment regimen. -Avoid spicy, fatty and fried food -Avoid sodas and sour juices -Avoid heavy meals -Avoid eating 4 hours before bedtime -Elevate head of bed at night - omeprazole (PRILOSEC) 20 MG capsule; Take 1 capsule (20 mg total) by mouth daily.  Dispense: 30 capsule; Refill: 4  3. Essential hypertension - Her blood pressure is under control and she will continue on current treatment regimen, DASH diet and exercise as tolerated. - losartan (COZAAR) 25 MG tablet; Take 1 tablet (25 mg total) by mouth daily.  Dispense: 30 tablet; Refill: 4  4. Left knee pain, unspecified chronicity - She will follow up with [facility] Orthopedic Surgeon Dr Vickki Hearing.  5. Right hip pain -She will follow up  with [facility] Orthopedic Surgeon Dr Vickki Hearing.    6. Health care maintenance - She was encouraged to complete [facility] financial application for  - Ambulatory referral to Gastroenterology for Colonoscopy screening.    Follow-up: Return in about 3 months (around 04/12/2020), or if symptoms worsen or fail to improve.    Monique Litsey Jerold Coombe, NP

## 2020-01-12 NOTE — Patient Instructions (Signed)
DASH Eating Plan DASH stands for "Dietary Approaches to Stop Hypertension." The DASH eating plan is a healthy eating plan that has been shown to reduce high blood pressure (hypertension). It may also reduce your risk for type 2 diabetes, heart disease, and stroke. The DASH eating plan may also help with weight loss. What are tips for following this plan?  General guidelines  Avoid eating more than 2,300 mg (milligrams) of salt (sodium) a day. If you have hypertension, you may need to reduce your sodium intake to 1,500 mg a day.  Limit alcohol intake to no more than 1 drink a day for nonpregnant women and 2 drinks a day for men. One drink equals 12 oz of beer, 5 oz of wine, or 1 oz of hard liquor.  Work with your health care provider to maintain a healthy body weight or to lose weight. Ask what an ideal weight is for you.  Get at least 30 minutes of exercise that causes your heart to beat faster (aerobic exercise) most days of the week. Activities may include walking, swimming, or biking.  Work with your health care provider or diet and nutrition specialist (dietitian) to adjust your eating plan to your individual calorie needs. Reading food labels   Check food labels for the amount of sodium per serving. Choose foods with less than 5 percent of the Daily Value of sodium. Generally, foods with less than 300 mg of sodium per serving fit into this eating plan.  To find whole grains, look for the word "whole" as the first word in the ingredient list. Shopping  Buy products labeled as "low-sodium" or "no salt added."  Buy fresh foods. Avoid canned foods and premade or frozen meals. Cooking  Avoid adding salt when cooking. Use salt-free seasonings or herbs instead of table salt or sea salt. Check with your health care provider or pharmacist before using salt substitutes.  Do not fry foods. Cook foods using healthy methods such as baking, boiling, grilling, and broiling instead.  Cook with  heart-healthy oils, such as olive, canola, soybean, or sunflower oil. Meal planning  Eat a balanced diet that includes: ? 5 or more servings of fruits and vegetables each day. At each meal, try to fill half of your plate with fruits and vegetables. ? Up to 6-8 servings of whole grains each day. ? Less than 6 oz of lean meat, poultry, or fish each day. A 3-oz serving of meat is about the same size as a deck of cards. One egg equals 1 oz. ? 2 servings of low-fat dairy each day. ? A serving of nuts, seeds, or beans 5 times each week. ? Heart-healthy fats. Healthy fats called Omega-3 fatty acids are found in foods such as flaxseeds and coldwater fish, like sardines, salmon, and mackerel.  Limit how much you eat of the following: ? Canned or prepackaged foods. ? Food that is high in trans fat, such as fried foods. ? Food that is high in saturated fat, such as fatty meat. ? Sweets, desserts, sugary drinks, and other foods with added sugar. ? Full-fat dairy products.  Do not salt foods before eating.  Try to eat at least 2 vegetarian meals each week.  Eat more home-cooked food and less restaurant, buffet, and fast food.  When eating at a restaurant, ask that your food be prepared with less salt or no salt, if possible. What foods are recommended? The items listed may not be a complete list. Talk with your dietitian about   what dietary choices are best for you. Grains Whole-grain or whole-wheat bread. Whole-grain or whole-wheat pasta. Brown rice. Oatmeal. Quinoa. Bulgur. Whole-grain and low-sodium cereals. Pita bread. Low-fat, low-sodium crackers. Whole-wheat flour tortillas. Vegetables Fresh or frozen vegetables (raw, steamed, roasted, or grilled). Low-sodium or reduced-sodium tomato and vegetable juice. Low-sodium or reduced-sodium tomato sauce and tomato paste. Low-sodium or reduced-sodium canned vegetables. Fruits All fresh, dried, or frozen fruit. Canned fruit in natural juice (without  added sugar). Meat and other protein foods Skinless chicken or turkey. Ground chicken or turkey. Pork with fat trimmed off. Fish and seafood. Egg whites. Dried beans, peas, or lentils. Unsalted nuts, nut butters, and seeds. Unsalted canned beans. Lean cuts of beef with fat trimmed off. Low-sodium, lean deli meat. Dairy Low-fat (1%) or fat-free (skim) milk. Fat-free, low-fat, or reduced-fat cheeses. Nonfat, low-sodium ricotta or cottage cheese. Low-fat or nonfat yogurt. Low-fat, low-sodium cheese. Fats and oils Soft margarine without trans fats. Vegetable oil. Low-fat, reduced-fat, or light mayonnaise and salad dressings (reduced-sodium). Canola, safflower, olive, soybean, and sunflower oils. Avocado. Seasoning and other foods Herbs. Spices. Seasoning mixes without salt. Unsalted popcorn and pretzels. Fat-free sweets. What foods are not recommended? The items listed may not be a complete list. Talk with your dietitian about what dietary choices are best for you. Grains Baked goods made with fat, such as croissants, muffins, or some breads. Dry pasta or rice meal packs. Vegetables Creamed or fried vegetables. Vegetables in a cheese sauce. Regular canned vegetables (not low-sodium or reduced-sodium). Regular canned tomato sauce and paste (not low-sodium or reduced-sodium). Regular tomato and vegetable juice (not low-sodium or reduced-sodium). Pickles. Olives. Fruits Canned fruit in a light or heavy syrup. Fried fruit. Fruit in cream or butter sauce. Meat and other protein foods Fatty cuts of meat. Ribs. Fried meat. Bacon. Sausage. Bologna and other processed lunch meats. Salami. Fatback. Hotdogs. Bratwurst. Salted nuts and seeds. Canned beans with added salt. Canned or smoked fish. Whole eggs or egg yolks. Chicken or turkey with skin. Dairy Whole or 2% milk, cream, and half-and-half. Whole or full-fat cream cheese. Whole-fat or sweetened yogurt. Full-fat cheese. Nondairy creamers. Whipped toppings.  Processed cheese and cheese spreads. Fats and oils Butter. Stick margarine. Lard. Shortening. Ghee. Bacon fat. Tropical oils, such as coconut, palm kernel, or palm oil. Seasoning and other foods Salted popcorn and pretzels. Onion salt, garlic salt, seasoned salt, table salt, and sea salt. Worcestershire sauce. Tartar sauce. Barbecue sauce. Teriyaki sauce. Soy sauce, including reduced-sodium. Steak sauce. Canned and packaged gravies. Fish sauce. Oyster sauce. Cocktail sauce. Horseradish that you find on the shelf. Ketchup. Mustard. Meat flavorings and tenderizers. Bouillon cubes. Hot sauce and Tabasco sauce. Premade or packaged marinades. Premade or packaged taco seasonings. Relishes. Regular salad dressings. Where to find more information:  National Heart, Lung, and Blood Institute: www.nhlbi.nih.gov  American Heart Association: www.heart.org Summary  The DASH eating plan is a healthy eating plan that has been shown to reduce high blood pressure (hypertension). It may also reduce your risk for type 2 diabetes, heart disease, and stroke.  With the DASH eating plan, you should limit salt (sodium) intake to 2,300 mg a day. If you have hypertension, you may need to reduce your sodium intake to 1,500 mg a day.  When on the DASH eating plan, aim to eat more fresh fruits and vegetables, whole grains, lean proteins, low-fat dairy, and heart-healthy fats.  Work with your health care provider or diet and nutrition specialist (dietitian) to adjust your eating plan to your   individual calorie needs. This information is not intended to replace advice given to you by your health care provider. Make sure you discuss any questions you have with your health care provider. Document Revised: 04/05/2017 Document Reviewed: 04/16/2016 Elsevier Patient Education  2020 Elsevier Inc.  

## 2020-01-14 ENCOUNTER — Telehealth: Payer: Self-pay | Admitting: Gerontology

## 2020-01-14 NOTE — Telephone Encounter (Signed)
-----   Message from Rolm Gala, NP sent at 01/12/2020  9:37 PM EDT ----- Pls schedule F/U appointment on 04/12/2020 in clinic, notify patient with time, make telephone note. Thank you

## 2020-02-23 ENCOUNTER — Ambulatory Visit: Payer: Self-pay | Admitting: Specialist

## 2020-03-10 ENCOUNTER — Ambulatory Visit: Payer: Self-pay | Admitting: Pulmonary Disease

## 2020-03-15 ENCOUNTER — Ambulatory Visit: Payer: Self-pay | Admitting: Gerontology

## 2020-03-29 ENCOUNTER — Other Ambulatory Visit: Payer: Self-pay

## 2020-03-29 ENCOUNTER — Ambulatory Visit: Payer: Self-pay | Admitting: Specialist

## 2020-03-29 DIAGNOSIS — G8929 Other chronic pain: Secondary | ICD-10-CM

## 2020-03-29 MED ORDER — MELOXICAM 7.5 MG PO TABS: 7.5000 mg | ORAL_TABLET | Freq: Every day | ORAL | 0 refills | Status: DC

## 2020-03-29 NOTE — Progress Notes (Signed)
°  Subjective:     Patient ID: Monique Edwards, female   DOB: [birth year], 54 y.o.   MRN: [id number]  HPI  54 year with several month of left knee pain, denies history of trauma. She's does state she has mechanical symptoms. She works cleaning houses at times . She has trouble with stairs with descending than ascending.   Review of Systems     Objective:   Physical Exam  Her gait is normal, she's able to heel/toe walk, able to accomplish tandem gait, able to do 50% of a squat and rises in a monophasic fashion. She can fully extend the left knee with minimal patellofemoral crepitus. Her " Q" angle is slightly increased bilaterally. Their is no effusion to left knee, has good patella MOB. In the supine position, her ROM is zero to 130 degrees. In full extension and 30 degree of flexion their is no M/L laxity. The Lachman test is negative with flexed/ROT their is pain reproduced in the Supra Patella pouch.    Assessment:         Plan:     Meloxicam 7.5 mg daily x 14 days Physical Therapy

## 2020-04-12 ENCOUNTER — Ambulatory Visit (INDEPENDENT_AMBULATORY_CARE_PROVIDER_SITE_OTHER): Payer: Self-pay | Admitting: Gastroenterology

## 2020-04-12 ENCOUNTER — Other Ambulatory Visit: Payer: Self-pay

## 2020-04-12 VITALS — BP 122/84 | HR 60 | Temp 98.2°F | Ht 63.0 in | Wt 178.0 lb

## 2020-04-12 DIAGNOSIS — K7290 Hepatic failure, unspecified without coma: Secondary | ICD-10-CM

## 2020-04-12 DIAGNOSIS — K7460 Unspecified cirrhosis of liver: Secondary | ICD-10-CM

## 2020-04-12 DIAGNOSIS — K7682 Hepatic encephalopathy: Secondary | ICD-10-CM

## 2020-04-12 DIAGNOSIS — Z86010 Personal history of colonic polyps: Secondary | ICD-10-CM

## 2020-04-12 MED ORDER — LACTULOSE 10 GM/15ML PO SOLN: 10.0000 g | Freq: Three times a day (TID) | ORAL | 1 refills | Status: DC

## 2020-04-12 MED ORDER — NA SULFATE-K SULFATE-MG SULF 17.5-3.13-1.6 GM/177ML PO SOLN: 1.0000 | Freq: Once | ORAL | 0 refills | Status: AC

## 2020-04-12 MED ORDER — RIFAXIMIN 550 MG PO TABS: 550.0000 mg | ORAL_TABLET | Freq: Two times a day (BID) | ORAL | 1 refills | Status: DC

## 2020-04-12 NOTE — Progress Notes (Signed)
Jonathon Bellows MD, MRCP(U.K) [address]  [place]  [place], [place] [address]  Main: [phone]  Fax: [phone]   Primary Care Physician: Langston Reusing, NP  Primary Gastroenterologist:  Dr. Jonathon Bellows   Follow-up for cirrhosis   HPI: Monique Edwards is a 54 y.o. female    Summary of history :  She was last seen back in January 2020. 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 Cured in 2015 . She was compensated in terms of her cirrhosis back in 2017. EGD in 2017 showed PHG and no varices. Last colonoscopy in 2017 and two small adenomas excised  Interval history   05/21/2018-04/12/2020  06/06/2018: EGD:  No varices- repeat in 2023 05/23/2018: RUQUSG: no [place] 05/2018: no hcv virus in blood No recent labs since April 2021 and did not follow-up On a low-salt diet She says she has been losing weight intentionally She still drinks alcohol occasionally advised her to stop doing so Has difficulty sleeping at night and has daytime somnolence at times.  Some confusion as well.  Not taking lactulose regularly.  Has soft bowel movements daily. Has been taking Aleve regularly advised to stop Denies any other complaints.  Current Outpatient Medications  Medication Sig Dispense Refill  . albuterol (VENTOLIN HFA) 108 (90 Base) MCG/ACT inhaler Inhale 2 puffs into the lungs every 6 (six) hours as needed for wheezing or shortness of breath. 18 g 2  . Blood Pressure Monitor KIT 1 kit by Does not apply route daily. 1 kit 0  . cetirizine (ZYRTEC) 10 MG tablet Take 10 mg by mouth daily. (Patient not taking: Reported on 01/12/2020)    . ferrous sulfate 325 (65 FE) MG EC tablet Take 325 mg by mouth daily with breakfast. (Patient not taking: Reported on 04/12/2020)    . Fluticasone-Salmeterol (ADVAIR DISKUS) 250-50 MCG/DOSE AEPB Inhale 1 puff into the lungs 2 (two) times daily. 1 puff twice daiy. Rinse mouth  after use. 180 each 3  . losartan (COZAAR) 25 MG tablet TAKE ONE TABLET BY MOUTH DAILY 90 tablet 0  . meloxicam (MOBIC) 7.5 MG tablet Take 1 tablet (7.5 mg total) by mouth daily. 14 tablet 0  . naproxen sodium (ALEVE) 220 MG tablet Take 440 mg by mouth daily as needed (Knee/hip pain).    Marland Kitchen omeprazole (PRILOSEC) 20 MG capsule Take 1 capsule (20 mg total) by mouth daily. 30 capsule 4  . predniSONE (DELTASONE) 10 MG tablet Take 4 tabs po daily x 2 days; then 3 tabs for 2 days; then 2 tabs for 2 days; then 1 tab for 2 days 20 tablet 0  . umeclidinium bromide (INCRUSE ELLIPTA) 62.5 MCG/INH AEPB Inhale 1 puff into the lungs daily. 1 each 3   No current facility-administered medications for this visit.    Allergies as of 04/12/2020 - Review Complete 04/12/2020  Allergen Reaction Noted  . Amlodipine Swelling 06/10/2018  . Lisinopril Cough 03/17/2018    ROS:  General: Negative for anorexia, weight loss, fever, chills, fatigue, weakness. ENT: Negative for hoarseness, difficulty swallowing , nasal congestion. CV: Negative for chest pain, angina, palpitations, dyspnea on exertion, peripheral edema.  Respiratory: Negative for dyspnea at rest, dyspnea on exertion, cough, sputum, wheezing.  GI: See history of present illness. GU:  Negative for dysuria, hematuria, urinary incontinence, urinary frequency, nocturnal urination.  Endo: Negative for unusual weight change.    Physical Examination:   BP 122/84   Pulse  60   Temp 98.2 F (36.8 C)   Ht _0  (1.6 m)   Wt 178 lb (80.7 kg)   LMP 02/28/2014 (Approximate)   BMI 31.53 kg/m   General: Well-nourished, well-developed in no acute distress.  Eyes: No icterus. Conjunctivae pink. Mouth: Oropharyngeal mucosa moist and pink , no lesions erythema or exudate. Lungs: Clear to auscultation bilaterally. Non-labored. Heart: Regular rate and rhythm, no murmurs rubs or gallops.  Abdomen: Bowel sounds are normal, nontender, nondistended, no  hepatosplenomegaly or masses, no abdominal bruits or hernia , no rebound or guarding.   Extremities: No lower extremity edema. No clubbing or deformities. Neuro: Alert and oriented x 3.  Grossly intact. Skin: Warm and dry, no jaundice.   Psych: Alert and cooperative, normal mood and affect.   Imaging Studies: No results found.  Assessment and Plan:   Monique Edwards is a 54 y.o. y/o female here to follow-up for cirrhosis of the liver  , hepatitis C cured in 2015 . Not followed up since April 2020.  Clinically she has some history suggestive of hepatic encephalopathy and has been on lactulose but not adequately controlled.  Has been on Aleve.   Plan  1. EGD to screen for Varices in January 2023 2. Labs to calculate MELD score 3. RUQ USG to screen for [place] 4. Check Hep A/B vaccine status and immunize as needed.  Advised to obtain Covid vaccination booster 6 months after second dose 5.  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 Commence on lactulose and titrate to two soft bowel movements per day  7. Personal history of colon polyps scheduled for colonoscopy in 2022 8.  Advised to maintain follow-up regularly  I have discussed alternative options, risks & benefits,  which include, but are not limited to, bleeding, infection, perforation,respiratory complication & drug reaction.  The patient agrees with this plan & written consent will be obtained.    Dr Jonathon Bellows  MD,MRCP [facility]) Follow up in 6 months

## 2020-04-13 LAB — CBC WITH DIFFERENTIAL/PLATELET
Basophils Absolute: 0 10*3/uL (ref 0.0–0.2)
Basos: 1 %
EOS (ABSOLUTE): 0.1 10*3/uL (ref 0.0–0.4)
Eos: 1 %
Hematocrit: 34.8 % (ref 34.0–46.6)
Hemoglobin: 12.1 g/dL (ref 11.1–15.9)
Immature Grans (Abs): 0 10*3/uL (ref 0.0–0.1)
Immature Granulocytes: 0 %
Lymphocytes Absolute: 1 10*3/uL (ref 0.7–3.1)
Lymphs: 29 %
MCH: 31.2 pg (ref 26.6–33.0)
MCHC: 34.8 g/dL (ref 31.5–35.7)
MCV: 90 fL (ref 79–97)
Monocytes Absolute: 0.3 10*3/uL (ref 0.1–0.9)
Monocytes: 9 %
Neutrophils Absolute: 2.1 10*3/uL (ref 1.4–7.0)
Neutrophils: 60 %
Platelets: 75 10*3/uL — CL (ref 150–450)
RBC: 3.88 x10E6/uL (ref 3.77–5.28)
RDW: 12.6 % (ref 11.7–15.4)
WBC: 3.5 10*3/uL (ref 3.4–10.8)

## 2020-04-13 LAB — COMPREHENSIVE METABOLIC PANEL
ALT: 17 IU/L (ref 0–32)
AST: 21 IU/L (ref 0–40)
Albumin/Globulin Ratio: 1.6 (ref 1.2–2.2)
Albumin: 4.1 g/dL (ref 3.8–4.9)
Alkaline Phosphatase: 55 IU/L (ref 44–121)
BUN/Creatinine Ratio: 20 (ref 9–23)
BUN: 15 mg/dL (ref 6–24)
Bilirubin Total: 0.3 mg/dL (ref 0.0–1.2)
CO2: 21 mmol/L (ref 20–29)
Calcium: 9.2 mg/dL (ref 8.7–10.2)
Chloride: 107 mmol/L — ABNORMAL HIGH (ref 96–106)
Creatinine, Ser: 0.76 mg/dL (ref 0.57–1.00)
GFR calc Af Amer: 101 mL/min/{1.73_m2} (ref >59)
GFR calc non Af Amer: 88 mL/min/{1.73_m2} (ref >59)
Globulin, Total: 2.6 g/dL (ref 1.5–4.5)
Glucose: 92 mg/dL (ref 65–99)
Potassium: 3.9 mmol/L (ref 3.5–5.2)
Sodium: 141 mmol/L (ref 134–144)
Total Protein: 6.7 g/dL (ref 6.0–8.5)

## 2020-04-13 LAB — PROTIME-INR
INR: 1 (ref 0.9–1.2)
Prothrombin Time: 10.3 s (ref 9.1–12.0)

## 2020-04-13 LAB — HEPATITIS B SURFACE ANTIBODY,QUALITATIVE: Hep B Surface Ab, Qual: NONREACTIVE

## 2020-04-13 LAB — HEPATITIS A ANTIBODY, TOTAL: hep A Total Ab: NEGATIVE

## 2020-04-14 ENCOUNTER — Other Ambulatory Visit: Payer: Self-pay

## 2020-04-14 ENCOUNTER — Ambulatory Visit: Payer: Self-pay

## 2020-04-14 MED ORDER — PEG 3350-KCL-NABCB-NACL-NASULF 236 G PO SOLR: ORAL | 0 refills | Status: DC

## 2020-04-18 ENCOUNTER — Ambulatory Visit: Payer: Self-pay | Attending: Gastroenterology

## 2020-04-19 ENCOUNTER — Ambulatory Visit: Payer: Self-pay | Admitting: Pulmonary Disease

## 2020-04-20 ENCOUNTER — Other Ambulatory Visit: Payer: Self-pay

## 2020-04-20 ENCOUNTER — Other Ambulatory Visit: Payer: Self-pay | Admitting: Gerontology

## 2020-04-20 ENCOUNTER — Ambulatory Visit: Payer: Medicaid Other | Admitting: Adult Health

## 2020-04-20 DIAGNOSIS — K219 Gastro-esophageal reflux disease without esophagitis: Secondary | ICD-10-CM

## 2020-04-20 DIAGNOSIS — I10 Essential (primary) hypertension: Secondary | ICD-10-CM

## 2020-04-21 ENCOUNTER — Other Ambulatory Visit: Payer: Self-pay | Admitting: Gerontology

## 2020-04-22 ENCOUNTER — Telehealth: Payer: Self-pay | Admitting: Pharmacist

## 2020-04-22 NOTE — Telephone Encounter (Signed)
04/22/2020 8:23:04 AM - [facility]smith pending  -- Rhetta Mura - Friday, April 22, 2020 8:22 AM --I have received the signed provider portion of application for Xifaxan, holding for patient to return her portion, mailed to patient 04/13/2020.

## 2020-04-22 NOTE — Telephone Encounter (Signed)
04/22/2020 12:15:34 PM - ProAir HFA to pt & Dr (for Ventolin)  -- Monique Edwards - Friday, April 22, 2020 12:14 PM --ProAir HFA Inhale 2 puffs into the lungs every 4-6 hours as needed (replaces Ventolin HFA) Mailing patient her portion to sign & return with 1 month of household income/support letter & sending to [facility] for provider to sign.   04/22/2020 12:13:59 PM - [employer] renewal to pt & dr for Advair&Incruse Ellipta  -- Monique Edwards - Friday, April 22, 2020 12:10 PM --Patient enrollment with [employer] ends 04/26/2020-printed renewal application-mailing patient her portion to sign & return with 1 month of household income or support letter, also scripts to [facility] for Advair 250/50 Inhale 1 puff into the lungs 2 times a day, rinse mouth and spit after each use, & Incruse Ellipta 62. Inhale 1 puff into the lungs once a day every day.  See next note for replacement on Ventolin

## 2020-04-25 ENCOUNTER — Telehealth: Payer: Self-pay | Admitting: Pharmacist

## 2020-04-25 ENCOUNTER — Other Ambulatory Visit: Payer: Self-pay

## 2020-04-25 ENCOUNTER — Telehealth: Payer: Self-pay

## 2020-04-25 NOTE — Telephone Encounter (Signed)
04/25/2020 10:49:16 AM - Temp provider due to Eliz-vac.  -- Rhetta Mura - Monday, April 25, 2020 10:45 AM --I have received an email from Keri to temporary change provider from Elizabeth(vacation till 05/17/20) to Desta Abate,NP to sign in [place] absence.   ProAir HFA application & script, also scripts for renewal: Advair & Incruse Ellipta to [facility] for this provider to sign.

## 2020-04-25 NOTE — Telephone Encounter (Signed)
Pt has been notified of results and Dr. Anna's recommendations. 

## 2020-04-25 NOTE — Telephone Encounter (Signed)
-----   Message from Wyline Mood, MD sent at 04/21/2020 11:22 AM EST ----- Inform not immune to hepatitis a and B needs vaccination for both.

## 2020-04-26 ENCOUNTER — Encounter (INDEPENDENT_AMBULATORY_CARE_PROVIDER_SITE_OTHER): Payer: Self-pay | Admitting: Gastroenterology

## 2020-04-26 DIAGNOSIS — Z23 Encounter for immunization: Secondary | ICD-10-CM

## 2020-05-06 ENCOUNTER — Other Ambulatory Visit: Payer: Medicaid Other | Attending: Gastroenterology

## 2020-05-11 ENCOUNTER — Ambulatory Visit: Payer: Medicaid Other | Admitting: Gerontology

## 2020-05-17 ENCOUNTER — Ambulatory Visit: Payer: Self-pay | Admitting: Pulmonary Disease

## 2020-05-18 ENCOUNTER — Telehealth: Payer: Self-pay | Admitting: Pharmacy Technician

## 2020-05-18 NOTE — Telephone Encounter (Signed)
Received updated proof of income.  Patient eligible to receive medication assistance at [facility] until time for re-certification in 1610, and as long as eligibility requirements continue to be met.  [facility]

## 2020-05-19 ENCOUNTER — Telehealth: Payer: Self-pay | Admitting: Pharmacist

## 2020-05-19 NOTE — Telephone Encounter (Signed)
05/19/2020 9:11:02 AM - [facility] renewal for Incruse Ellipta&Advair  -- Rhetta Mura - Thursday, May 19, 2020 9:09 AM --Faxed [employer] renewal application for Advair 250/50 Inhale 1 puff into the lungs 2 times a day, rinse mouth and spit after each use & Incruse Ellipta 62.5 Inhale 1 puff into the lungs once a day.   05/19/2020 9:09:43 AM - ProAir faxed to [employer]  -- Rhetta Mura - Thursday, May 19, 2020 9:08 AM --Donetta Potts application for ProAir HFA Inhale 2 puffs into the lungs every 4-6 hours as needed (replaces Ventolin HFA).   05/19/2020 9:08:49 AM - Burman Blacksmith faxed to [facility]  -- Rhetta Mura - Thursday, May 19, 2020 9:07 AM --Lenda Kelp application for Xifaxan 550mg  Take one tablet 2 times a day--request that medication be shipped to our address.

## 2020-05-20 ENCOUNTER — Telehealth: Payer: Self-pay | Admitting: Primary Care

## 2020-05-20 NOTE — Telephone Encounter (Signed)
Lm x2 for patient.  ATC EC- received busy signal.

## 2020-05-20 NOTE — Telephone Encounter (Signed)
Lm for patient.  

## 2020-05-24 ENCOUNTER — Other Ambulatory Visit: Payer: Self-pay

## 2020-05-24 ENCOUNTER — Ambulatory Visit: Payer: Medicaid Other

## 2020-05-24 MED ORDER — PREDNISONE 10 MG PO TABS: ORAL_TABLET | ORAL | 0 refills | Status: DC

## 2020-05-24 MED ORDER — AZITHROMYCIN 250 MG PO TABS: ORAL_TABLET | ORAL | 0 refills | Status: DC

## 2020-05-24 NOTE — Telephone Encounter (Signed)
I will send in prescription for ZPack and prednisone taper for COPD exacerbation. Take over the counter Mucinex twice a day for next week. I would reschedule booster, should not get when sick or on steriods as this can decrease the effectiveness of response from vaccine.

## 2020-05-24 NOTE — Telephone Encounter (Signed)
Patient is aware of recommendations. She voiced her understanding and had no further questions.  Nothing further needed.  

## 2020-05-24 NOTE — Telephone Encounter (Signed)
Former Dr. Nicholos Johns patient. Last seen [name] on 05/13/2019. Upcoming appt for 06/21/2020.  Spoke to patient, who reports of increased sob with exertion, productive cough with grey sputum, wheezing,runny nose and nasal congestion x4d.  Denied fever, chills or sweats. She is using albuterol HFA BID and Advair 1 puff once daily.  Not current taking any OTC medications to help with sx.  She has two covid vaccines. She is scheduled for booster in February.  She has not flu shot.    [name], please advise. Thanks

## 2020-05-27 ENCOUNTER — Other Ambulatory Visit: Payer: Self-pay

## 2020-05-27 ENCOUNTER — Other Ambulatory Visit
Admission: RE | Admit: 2020-05-27 | Discharge: 2020-05-27 | Disposition: A | Discharge status: Home / Self Care | Payer: Medicaid Other | Source: Ambulatory Visit | Attending: Gastroenterology | Admitting: Gastroenterology

## 2020-05-27 DIAGNOSIS — Z20822 Contact with and (suspected) exposure to covid-19: Secondary | ICD-10-CM | POA: Insufficient documentation

## 2020-05-27 DIAGNOSIS — Z01812 Encounter for preprocedural laboratory examination: Secondary | ICD-10-CM | POA: Diagnosis not present

## 2020-05-28 LAB — SARS CORONAVIRUS 2 (TAT 6-24 HRS): SARS Coronavirus 2: POSITIVE — AB

## 2020-05-30 ENCOUNTER — Telehealth: Payer: Self-pay

## 2020-05-30 ENCOUNTER — Telehealth: Payer: Self-pay | Admitting: Pulmonary Disease

## 2020-05-30 MED ORDER — PREDNISONE 20 MG PO TABS: ORAL_TABLET | ORAL | 0 refills | Status: DC

## 2020-05-30 NOTE — Telephone Encounter (Signed)
I put her back up on 40mg  prednisone x 1 week then 20mg  x 1 week then stop. Not much more outpatient we can do. Take delsym for cough. Stay well hydrated, continue to isolate. Take tylenol for fever and fills. Yes would recommend she get pulse ox and if O2 levels sustaining <88-90% need ED evaluation.

## 2020-05-30 NOTE — Telephone Encounter (Signed)
Called patient to inform her of the covid results. Rescheduled procedure for a later date. Will mail updated instructions to patient. Pt verbalized understanding.

## 2020-05-30 NOTE — Telephone Encounter (Signed)
  Dr. Nicholos Johns patient last seen [facility] 05/13/2019 for COPD.  Please refer to 05/20/2020 phone note.   Spoke to via telephone, who stated that she tested positive for covid on 05/29/2020. Sx have been present for 1.5 week.   She has 1 day left of zpak and 4 days left on prednisone that was prescribed on 05/20/2020.  She reports of mild improvement with current treatment.  Albuterol HFA 2-3X daily and advair 1 puff BID. Sob improves some with inhalers.  C/o wheezing, chest heaviness, increased sob and productive cough with tan sputum. Denies fever, chills, sweats or additional sx.  She does not wear supplemental oxygen and she does not have a pulse ox to monitor oxyegn levels. .  Recommended that she purchase a pulse ox and complete course of prednisone and zpak.  She has had 2 covid vaccines. she does not qualify for MAB due to sx being outside of the 7 day window.  Patient is aware of recommendations and voiced understanding.   Routing to [employer] as an Financial planner.

## 2020-05-30 NOTE — Progress Notes (Signed)
Inform patient COVID positive, reschedule procedure, follow up for COVID with [place], Chioma E, NP

## 2020-05-30 NOTE — Telephone Encounter (Signed)
Patient is aware of recommendations. She voiced her understanding and had no further questions.  Nothing further needed at this time.

## 2020-05-30 NOTE — Telephone Encounter (Signed)
Lm for patient.  

## 2020-05-30 NOTE — Telephone Encounter (Signed)
Trish with ENDO states that Patient is positive for COVID will need procedure to be moved from 05/31/2020

## 2020-06-01 ENCOUNTER — Other Ambulatory Visit: Payer: Self-pay | Admitting: *Deleted

## 2020-06-01 MED ORDER — PREDNISONE 20 MG PO TABS: ORAL_TABLET | ORAL | 0 refills | Status: AC

## 2020-06-06 ENCOUNTER — Telehealth: Payer: Self-pay | Admitting: Pulmonary Disease

## 2020-06-06 NOTE — Telephone Encounter (Signed)
Spoke to patient, who is requesting clarification on prednisone directions.  Patient is aware that she should take 40mg  for 7 days then 20mg  for 7 days.  She voiced her understanding and had no further questions.  Nothing further needed.

## 2020-06-21 ENCOUNTER — Ambulatory Visit: Payer: Self-pay | Admitting: Pulmonary Disease

## 2020-06-23 ENCOUNTER — Encounter: Admission: RE | Payer: Self-pay | Source: Home / Self Care

## 2020-06-23 ENCOUNTER — Ambulatory Visit: Admission: RE | Admit: 2020-06-23 | Payer: Self-pay | Source: Home / Self Care | Admitting: Gastroenterology

## 2020-06-23 SURGERY — COLONOSCOPY WITH PROPOFOL
Anesthesia: General

## 2020-06-29 ENCOUNTER — Ambulatory Visit: Payer: Medicaid Other | Admitting: Gerontology

## 2020-07-21 ENCOUNTER — Telehealth: Payer: Self-pay | Admitting: Pharmacist

## 2020-07-21 ENCOUNTER — Ambulatory Visit: Payer: Medicaid Other | Admitting: Gerontology

## 2020-07-21 NOTE — Telephone Encounter (Signed)
07/21/2020 10:19:23 AM - Advair refill online with [employer]  -- Rhetta Mura - Thursday, July 21, 2020 10:18 AM -- Placed refill online with [employer] for Advair, order# [id number], allow 7-10 business days to receive.

## 2020-08-04 ENCOUNTER — Ambulatory Visit: Payer: Medicaid Other | Admitting: Gerontology

## 2020-08-04 ENCOUNTER — Telehealth: Payer: Self-pay | Admitting: Pharmacist

## 2020-08-04 NOTE — Telephone Encounter (Signed)
08/04/2020 8:10:50 AM - ProAir refill faxed to [employer]  -- Rhetta Mura - Thursday, August 04, 2020 8:10 AM -- Received notice from [employer] to check for refill, checked and faxed back for processing.

## 2020-08-08 ENCOUNTER — Other Ambulatory Visit: Payer: Self-pay

## 2020-08-09 ENCOUNTER — Other Ambulatory Visit: Payer: Self-pay

## 2020-08-09 MED ORDER — OMEPRAZOLE 20 MG PO CPDR
DELAYED_RELEASE_CAPSULE | ORAL | 1 refills | Status: DC
  Filled 2020-09-14: qty 90, 90d supply, fill #0

## 2020-08-12 ENCOUNTER — Other Ambulatory Visit: Payer: Self-pay

## 2020-08-18 ENCOUNTER — Telehealth: Payer: Self-pay | Admitting: Pharmacist

## 2020-08-18 NOTE — Telephone Encounter (Signed)
08/18/2020 12:46:55 PM - Charyl Dancer Ellipta refill online with [employer]  -- Rhetta Mura - Thursday, August 18, 2020 12:46 PM --Placed refill online with [employer] for [employer], order# [id number].

## 2020-08-23 ENCOUNTER — Telehealth: Payer: Self-pay | Admitting: Gastroenterology

## 2020-08-23 NOTE — Telephone Encounter (Signed)
Patient lvm that she is ready to r/s her procedure.

## 2020-08-25 ENCOUNTER — Ambulatory Visit: Payer: Medicaid Other | Admitting: Gerontology

## 2020-08-25 ENCOUNTER — Other Ambulatory Visit: Payer: Self-pay

## 2020-08-25 ENCOUNTER — Encounter: Payer: Self-pay | Admitting: Gerontology

## 2020-08-25 VITALS — BP 147/75 | HR 55 | Temp 97.5°F | Resp 16 | Ht 64.0 in | Wt 182.5 lb

## 2020-08-25 DIAGNOSIS — R5383 Other fatigue: Secondary | ICD-10-CM

## 2020-08-25 DIAGNOSIS — B370 Candidal stomatitis: Secondary | ICD-10-CM

## 2020-08-25 DIAGNOSIS — K219 Gastro-esophageal reflux disease without esophagitis: Secondary | ICD-10-CM

## 2020-08-25 DIAGNOSIS — I10 Essential (primary) hypertension: Secondary | ICD-10-CM

## 2020-08-25 DIAGNOSIS — Z8709 Personal history of other diseases of the respiratory system: Secondary | ICD-10-CM

## 2020-08-25 DIAGNOSIS — Z0000 Encounter for general adult medical examination without abnormal findings: Secondary | ICD-10-CM

## 2020-08-25 DIAGNOSIS — G479 Sleep disorder, unspecified: Secondary | ICD-10-CM

## 2020-08-25 DIAGNOSIS — G8929 Other chronic pain: Secondary | ICD-10-CM

## 2020-08-25 MED ORDER — INCRUSE ELLIPTA 62.5 MCG/INH IN AEPB
INHALATION_SPRAY | RESPIRATORY_TRACT | 3 refills | Status: DC
  Filled 2020-08-25: qty 30, fill #0
  Filled 2020-08-30: qty 30, 30d supply, fill #0
  Filled 2020-09-29: qty 90, 90d supply, fill #0
  Filled 2020-12-13 – 2021-01-05 (×2): qty 30, 30d supply, fill #1

## 2020-08-25 MED ORDER — ALBUTEROL SULFATE HFA 108 (90 BASE) MCG/ACT IN AERS
INHALATION_SPRAY | RESPIRATORY_TRACT | 2 refills | Status: DC
  Filled 2020-08-25: qty 6.7, 25d supply, fill #0
  Filled 2020-09-20: qty 6.7, 25d supply, fill #1

## 2020-08-25 MED ORDER — LOSARTAN POTASSIUM 25 MG PO TABS
ORAL_TABLET | Freq: Every day | ORAL | 1 refills | Status: DC
  Filled 2020-08-25: qty 90, 90d supply, fill #0
  Filled 2020-12-13: qty 90, 90d supply, fill #1

## 2020-08-25 MED ORDER — FLUTICASONE-SALMETEROL 250-50 MCG/DOSE IN AEPB
INHALATION_SPRAY | RESPIRATORY_TRACT | 3 refills | Status: DC
  Filled 2020-08-25: qty 180, fill #0

## 2020-08-25 MED ORDER — MELATONIN 3 MG PO TABS
3.0000 mg | ORAL_TABLET | Freq: Every evening | ORAL | 0 refills | Status: DC | PRN
  Filled 2020-08-25: qty 30, 30d supply, fill #0

## 2020-08-25 MED ORDER — CETIRIZINE HCL 10 MG PO TABS
10.0000 mg | ORAL_TABLET | Freq: Every day | ORAL | 2 refills | Status: DC
  Filled 2020-08-25: qty 30, 30d supply, fill #0
  Filled 2020-10-11: qty 30, 30d supply, fill #1
  Filled 2020-11-22: qty 30, 30d supply, fill #2

## 2020-08-25 MED ORDER — NYSTATIN 100000 UNIT/ML MT SUSP
5.0000 mL | Freq: Four times a day (QID) | OROMUCOSAL | 0 refills | Status: DC
  Filled 2020-08-25: qty 60, 3d supply, fill #0

## 2020-08-25 NOTE — Progress Notes (Signed)
Established Patient Office Visit  Subjective:  Patient ID: Monique Edwards, female    DOB: Sep 01, 1965  Age: 55 y.o. MRN: [id number]  CC: No chief complaint on file.   HPI Diora Bellizzi is a 55 year old female who presents for follow up COPD, HTN, and GERD. She reports compliance with her current medications and continues to make healthy lifestyle choices. She does not check her blood pressure at home but denies any headaches, chest pain, palpitations, or lightheaded/dizziness. She denies any lower extremity swelling. Her breathing is well-controlled with use of her rescue inhaler 2-3/week. She reports occasional SOB and wheezing which is resolved with use of her albuterol inhaler. She does report a "film" on her tongue that has been persistent, despite regular cleaning.  Her GERD is well-controlled on her current regimen as well. She denies any dysphagia, odynophagia, hoarseness or feelings of fullness. She denies hematochezia or abdominal pain/discomfort. She reports bilateral chronic knee pain, which is worse in her left knee. She does occasionally experience swelling to her left knee as well. It has negatively impacted her activity level and ability to work. She reports worsening with climbing the stairs and being on her feet for long periods. She does take OTC aleve occasionally with moderate relief. She rates the pain 5/10. She is also experiencing feelings of anxiety/worry. She reports feeling anxious several days of the week and irritable. She denies any feelings of depression or SI/HI. She does reported some fatigue and difficulties sleeping at night. Overall, she feels well and offers no further complaints.   Past Medical History:  Diagnosis Date  . COPD (chronic obstructive pulmonary disease) ([place])   . Fatty liver   . GERD (gastroesophageal reflux disease)   . Hypertension     Past Surgical History:  Procedure Laterality Date  . CHOLECYSTECTOMY    . ESOPHAGOGASTRODUODENOSCOPY (EGD)  WITH PROPOFOL N/A 06/06/2018   Procedure: ESOPHAGOGASTRODUODENOSCOPY (EGD) WITH PROPOFOL;  Surgeon: Jonathon Bellows, MD;  Location: [facility] ENDOSCOPY;  Service: Gastroenterology;  Laterality: N/A;    Family History  Problem Relation Age of Onset  . Hypertension Mother   . Stroke Mother   . Diabetes Father   . Arthritis Sister   . Thyroid disease Sister   . Hypertension Brother   . Hypertension Brother     Social History   Socioeconomic History  . Marital status: Widowed    Spouse name: Not on file  . Number of children: Not on file  . Years of education: Not on file  . Highest education level: Not on file  Occupational History  . Occupation: Unemployed  Tobacco Use  . Smoking status: Current Some Day Smoker    Packs/day: 1.00    Years: 16.00    Pack years: 16.00    Types: Cigarettes    Start date: 11/04/1980  . Smokeless tobacco: Never Used  . Tobacco comment: occasionally 3 cigs daily--ms  Vaping Use  . Vaping Use: Never used  Substance and Sexual Activity  . Alcohol use: Not Currently    Comment: former occassional  . Drug use: Not Currently    Types: Cocaine  . Sexual activity: Not on file  Other Topics Concern  . Not on file  Social History Narrative  . Not on file   Social Determinants of Health   Financial Resource Strain: Not on file  Food Insecurity: Not on file  Transportation Needs: Not on file  Physical Activity: Not on file  Stress: Not on file  Social Connections: Not  on file  Intimate Partner Violence: Not on file    Outpatient Medications Prior to Visit  Medication Sig Dispense Refill  . albuterol (VENTOLIN HFA) 108 (90 Base) MCG/ACT inhaler INHALE 2 PUFFS INTO THE LUNGS EVERY 6 HOURS AS NEEDED FOR WHEEZING/SHORT OF BREATH 25.5 g 2  . azithromycin (ZITHROMAX) 250 MG tablet Zpack taper as directed 6 tablet 0  . Blood Pressure Monitor KIT 1 kit by Does not apply route daily. 1 kit 0  . cetirizine (ZYRTEC) 10 MG tablet Take 10 mg by mouth daily.  (Patient not taking: Reported on 01/12/2020)    . ferrous sulfate 325 (65 FE) MG EC tablet Take 325 mg by mouth daily with breakfast. (Patient not taking: Reported on 04/12/2020)    . Fluticasone-Salmeterol (ADVAIR) 250-50 MCG/DOSE AEPB INHALE 1 PUFF INTO THE LUNGS 2 (TWO) TIMES DAILY. 1 PUFF TWICE DAIY. RINSE MOUTH AFTER USE. 180 each 3  . lactulose (CHRONULAC) 10 GM/15ML solution TAKE 15MLS BY MOUTH 3 TIMES A DAY 4257 mL 1  . losartan (COZAAR) 25 MG tablet TAKE ONE TABLET BY MOUTH EVERY DAY 90 tablet 1  . meloxicam (MOBIC) 7.5 MG tablet Take 1 tablet (7.5 mg total) by mouth daily. 14 tablet 0  . naproxen sodium (ALEVE) 220 MG tablet Take 440 mg by mouth daily as needed (Knee/hip pain).    Marland Kitchen omeprazole (PRILOSEC) 20 MG capsule TAKE ONE CAPSULE BY MOUTH EVERY DAY 90 capsule 0  . omeprazole (PRILOSEC) 20 MG capsule Take 1 capsule (20 mg total) by mouth daily. 30 capsule 0  . omeprazole (PRILOSEC) 20 MG capsule Take 1 capsule ($RemoveBe'20mg'CcahqzWaX$ ) by mouth every day 90 capsule 1  . polyethylene glycol (GOLYTELY) 236 g solution Drink 8 oz every 20-30 minutes until entire prep is finished 4000 mL 0  . rifaximin (XIFAXAN) 550 MG TABS tablet Take 1 tablet (550 mg total) by mouth 2 (two) times daily. 180 tablet 1  . umeclidinium bromide (INCRUSE ELLIPTA) 62.5 MCG/INH AEPB INHALE 1 PUFF INTO THE LUNGS EVERY DAY 30 each 3   No facility-administered medications prior to visit.    Allergies  Allergen Reactions  . Amlodipine Swelling    Peripheral edema  . Lisinopril Cough    ROS Review of Systems  Constitutional: Positive for fatigue. Negative for appetite change, chills and fever.  HENT: Negative for sore throat, trouble swallowing and voice change.        Lesion on tongue; no pain associated   Eyes: Positive for visual disturbance (changes in eyesight). Negative for photophobia, pain and redness.  Respiratory: Positive for shortness of breath (occasional ) and wheezing (occasional).   Cardiovascular: Negative.    Gastrointestinal: Negative.   Endocrine: Positive for polydipsia. Negative for polyphagia and polyuria.  Genitourinary: Negative.   Musculoskeletal: Positive for arthralgias, joint swelling (L knee ) and myalgias (bilateral chronic knee pain).  Skin: Negative.   Neurological: Negative.   Psychiatric/Behavioral: Negative for dysphoric mood and suicidal ideas. The patient is nervous/anxious.       Objective:    Physical Exam Constitutional:      Appearance: Normal appearance. She is obese.  HENT:     Head: Normocephalic.     Nose: Nose normal.     Mouth/Throat:     Mouth: Mucous membranes are moist.     Tongue: Lesions (yellow lesions scattered on tongue; consistent with thrush) present.     Pharynx: Oropharynx is clear. Uvula midline.  Eyes:     Extraocular Movements: Extraocular movements intact.  Conjunctiva/sclera: Conjunctivae normal.     Pupils: Pupils are equal, round, and reactive to light.  Cardiovascular:     Rate and Rhythm: Normal rate and regular rhythm.     Pulses: Normal pulses.     Heart sounds: Normal heart sounds.  Pulmonary:     Effort: Pulmonary effort is normal. No tachypnea or respiratory distress.     Breath sounds: Normal breath sounds. No wheezing.  Abdominal:     General: Bowel sounds are normal.     Palpations: Abdomen is soft.  Musculoskeletal:        General: Tenderness (L knee) present.     Cervical back: Normal range of motion and neck supple.     Right knee: No swelling. Normal range of motion. No tenderness.     Left knee: No swelling. Normal range of motion. Tenderness present over the medial joint line.     Right lower leg: No edema.     Left lower leg: No edema.       Legs:  Skin:    General: Skin is warm and dry.     Capillary Refill: Capillary refill takes less than 2 seconds.  Neurological:     General: No focal deficit present.     Mental Status: She is alert and oriented to person, place, and time. Mental status is at  baseline.  Psychiatric:        Mood and Affect: Mood is anxious. Mood is not depressed.        Behavior: Behavior normal.        Thought Content: Thought content normal. Thought content does not include homicidal or suicidal ideation.        Judgment: Judgment normal.     GAD-7: 12  PHQ-2: 0  LMP 02/28/2014 (Approximate)  Wt Readings from Last 3 Encounters:  04/12/20 178 lb (80.7 kg)  01/12/20 182 lb 3.2 oz (82.6 kg)  09/24/19 191 lb 12.8 oz (87 kg)     Health Maintenance Due  Topic Date Due  . COVID-19 Vaccine (1) Never done  . HIV Screening  Never done  . TETANUS/TDAP  Never done  . COLONOSCOPY (Pts 45-32yrs Insurance coverage will need to be confirmed)  Never done  . MAMMOGRAM  01/30/2020    There are no preventive care reminders to display for this patient.  Lab Results  Component Value Date   TSH 2.130 03/19/2018   Lab Results  Component Value Date   WBC 3.5 04/12/2020   HGB 12.1 04/12/2020   HCT 34.8 04/12/2020   MCV 90 04/12/2020   PLT 75 (LL) 04/12/2020   Lab Results  Component Value Date   NA 141 04/12/2020   K 3.9 04/12/2020   CO2 21 04/12/2020   GLUCOSE 92 04/12/2020   BUN 15 04/12/2020   CREATININE 0.76 04/12/2020   BILITOT 0.3 04/12/2020   ALKPHOS 55 04/12/2020   AST 21 04/12/2020   ALT 17 04/12/2020   PROT 6.7 04/12/2020   ALBUMIN 4.1 04/12/2020   CALCIUM 9.2 04/12/2020   ANIONGAP 13 07/04/2019   Lab Results  Component Value Date   CHOL 168 03/11/2019   Lab Results  Component Value Date   HDL 76 03/11/2019   Lab Results  Component Value Date   LDLCALC 77 03/11/2019   Lab Results  Component Value Date   TRIG 81 03/11/2019   Lab Results  Component Value Date   CHOLHDL 2.2 03/11/2019   Lab Results  Component Value Date  HGBA1C 5.0 03/11/2019      Assessment & Plan:   1. Sleep disturbance Educated on good sleep hygiene. Will refer to [facility] for further evaluation and management as this could be related to her anxiety.  Discussed melatonin therapy and possible side effects. Notify of any worsening mood, depressed feelings, or SI/HI.  - melatonin 3 MG TABS tablet; Take 1 tablet (3 mg total) by mouth at bedtime as needed.  Dispense: 30 tablet; Refill: 0  2. Fatigue, unspecified type See above plan. Will obtain lab work to determine if underlying etiology present.  - melatonin 3 MG TABS tablet; Take 1 tablet (3 mg total) by mouth at bedtime as needed.  Dispense: 30 tablet; Refill: 0 - CBC w/Diff; Future - TSH; Future - Magnesium; Future - Phosphorus; Future  3. Healthcare maintenance Pt reported she is rescheduling colonoscopy. She will contact the office if GI requires a new referral. Referred for mammogram. Will calculate ASCVD risk score after labs result.  - Lipid Profile; Future - HgB A1c; Future  4. Oral thrush Discussed importance of proper oral hygiene and rinsing mouth out after steroid inhaler use. Educated on how to properly take nystatin with swish and swallow method. Notify if no resolution after 14 days of use.  - nystatin (MYCOSTATIN) 100000 UNIT/ML suspension; Take 5 mLs (500,000 Units total) by mouth 4 (four) times daily.  Dispense: 60 mL; Refill: 0  5. Essential hypertension BP slightly elevated today. Goal <140/90. Pt educated to check BP daily and maintain a log. Bring to the next appt. Will continue current medication regimen and determine if further intervention is necessary at next appointment. Educated on [place] and encouraged exercise as tolerated.  - Comp Met (CMET); Future - Lipid Profile; Future - Ambulatory referral to Ophthalmology - losartan (COZAAR) 25 MG tablet; TAKE ONE TABLET BY MOUTH EVERY DAY  Dispense: 90 tablet; Refill: 1  6. History of COPD Pt scheduled to follow up with pulmonologist. No recent exacerbations and breathing well-controlled per pt reports. Discussed to go to the ED if SOB/wheezing does not resolve with albuterol inhaler. Educated on need to rinse  mouth after admin of steroid inhaler.  - albuterol (VENTOLIN HFA) 108 (90 Base) MCG/ACT inhaler; INHALE 2 PUFFS INTO THE LUNGS EVERY 6 HOURS AS NEEDED FOR WHEEZING/SHORT OF BREATH  Dispense: 25.5 g; Refill: 2 - Fluticasone-Salmeterol (ADVAIR) 250-50 MCG/DOSE AEPB; INHALE 1 PUFF INTO THE LUNGS 2 (TWO) TIMES DAILY. 1 PUFF TWICE DAIY. RINSE MOUTH AFTER USE.  Dispense: 180 each; Refill: 3 - umeclidinium bromide (INCRUSE ELLIPTA) 62.5 MCG/INH AEPB; INHALE 1 PUFF INTO THE LUNGS EVERY DAY  Dispense: 30 each; Refill: 3  7. GERD without esophagitis Well-controlled. Discussed avoiding food triggers and having last meal 3 hours before bed. Elevated head of bed at night, if needed. Will evaluate mag/CMP for chronic PPI use. Continue current medication regimen.  - Comp Met (CMET); Future  8. Bilateral knee pain, chronic Continue OTC aleve PRN pain. Stop if you develop any abdominal discomfort or GI bleeding. Follow up with [facility] orthopedist Dr. Vickki Hearing.   Follow-up: Return in four weeks (09/22/20), or if symptoms worsen or do not improve.    Clayton Bibles, RN, BSN, FNP-S

## 2020-08-25 NOTE — Patient Instructions (Signed)
Oral Thrush, Adult Oral thrush is an infection in your mouth and throat and on your tongue. It causes white patches to form in your mouth and on your tongue. Many cases of thrush are mild. But, sometimes, thrush can be serious. People who have a weak body defense system (immune system) or other diseases can be affected more. What are the causes? This condition is caused by a type of fungus called yeast. The fungus is normally present in small amounts in the mouth and nose. If a person has a long-term illness or a weak body defense system, the fungus can grow and spread quickly. This causes thrush. What increases the risk? You are more likely to develop this condition if:  You have a weak body defense system.  You are an older adult.  You have diabetes, cancer, or HIV.  You have a dry mouth.  You are pregnant or breastfeeding.  You do not take good care of your teeth. This risk is greater for people who have false teeth (dentures).  You use antibiotic or steroid medicines. What are the signs or symptoms? Symptoms of this condition include:  A burning feeling in the mouth and throat.  White patches that stick to the mouth and tongue.  A bad taste in the mouth or trouble tasting foods.  A feeling like you have cotton in your mouth.  Pain when you eat and swallow.  Not wanting to eat as much as usual.  Cracking at the corners of the mouth.   How is this treated? This condition is treated with medicines called antifungals. These medicines prevent a fungus from growing. The medicines are either put right on the area (topical) or swallowed (oral). Your doctor will also treat other problems that you may have, such as diabetes or HIV. Follow these instructions at home: Medicines  Take or use over-the-counter and prescription medicines only as told by your doctor.  Ask your doctor about an over-the-counter medicine called gentian violet. Helping with pain and soreness To lessen  your pain:  Drink cold liquids, like water and iced tea.  Eat frozen ice pops or frozen juices.  Eat foods that are easy to swallow, like gelatin and ice cream.  Drink from a straw if you have too much pain in your mouth.   General instructions  Eat plain yogurt that has live cultures in it. Read the label to make sure that there are live cultures in your yogurt.  If you wear false teeth: ? Take them out before you go to bed. ? Brush them well. ? Soak them in a cleaner.  Rinse your mouth with warm salt-water many times a day. To make the salt-water mixture, dissolve -1 teaspoon (3-6 g) of salt in 1 cup (237 mL) of warm water. Contact a doctor if:  Your problems do not get better within 7 days of treatment.  Your infection is spreading. This may show as white areas on the skin outside of your mouth.  You are nursing your baby and you have redness and pain in the nipples. Summary  Oral thrush is an infection in your mouth and throat. It is caused by a fungus.  You are more likely to get this condition if you have a weak body defense system. Diseases like diabetes, cancer, or HIV also add to your risk.  This condition is treated with medicines called antifungals.  Contact a doctor if you do not get better within 7 days of starting treatment. This information  is not intended to replace advice given to you by your health care provider. Make sure you discuss any questions you have with your health care provider. Document Revised: 02/27/2019 Document Reviewed: 02/27/2019 Elsevier Patient Education  2021 Elsevier Inc. https://www.mata.com/.pdf">  DASH Eating Plan DASH stands for Dietary Approaches to Stop Hypertension. The DASH eating plan is a healthy eating plan that has been shown to:  Reduce high blood pressure (hypertension).  Reduce your risk for type 2 diabetes, heart disease, and stroke.  Help with weight loss. What are tips for  following this plan? Reading food labels  Check food labels for the amount of salt (sodium) per serving. Choose foods with less than 5 percent of the Daily Value of sodium. Generally, foods with less than 300 milligrams (mg) of sodium per serving fit into this eating plan.  To find whole grains, look for the word "whole" as the first word in the ingredient list. Shopping  Buy products labeled as "low-sodium" or "no salt added."  Buy fresh foods. Avoid canned foods and pre-made or frozen meals. Cooking  Avoid adding salt when cooking. Use salt-free seasonings or herbs instead of table salt or sea salt. Check with your health care provider or pharmacist before using salt substitutes.  Do not fry foods. Cook foods using healthy methods such as baking, boiling, grilling, roasting, and broiling instead.  Cook with heart-healthy oils, such as olive, canola, avocado, soybean, or sunflower oil. Meal planning  Eat a balanced diet that includes: ? 4 or more servings of fruits and 4 or more servings of vegetables each day. Try to fill one-half of your plate with fruits and vegetables. ? 6-8 servings of whole grains each day. ? Less than 6 oz (170 g) of lean meat, poultry, or fish each day. A 3-oz (85-g) serving of meat is about the same size as a deck of cards. One egg equals 1 oz (28 g). ? 2-3 servings of low-fat dairy each day. One serving is 1 cup (237 mL). ? 1 serving of nuts, seeds, or beans 5 times each week. ? 2-3 servings of heart-healthy fats. Healthy fats called omega-3 fatty acids are found in foods such as walnuts, flaxseeds, fortified milks, and eggs. These fats are also found in cold-water fish, such as sardines, salmon, and mackerel.  Limit how much you eat of: ? Canned or prepackaged foods. ? Food that is high in trans fat, such as some fried foods. ? Food that is high in saturated fat, such as fatty meat. ? Desserts and other sweets, sugary drinks, and other foods with added  sugar. ? Full-fat dairy products.  Do not salt foods before eating.  Do not eat more than 4 egg yolks a week.  Try to eat at least 2 vegetarian meals a week.  Eat more home-cooked food and less restaurant, buffet, and fast food.   Lifestyle  When eating at a restaurant, ask that your food be prepared with less salt or no salt, if possible.  If you drink alcohol: ? Limit how much you use to:  0-1 drink a day for women who are not pregnant.  0-2 drinks a day for men. ? Be aware of how much alcohol is in your drink. In the [place] one drink equals one 12 oz bottle of beer (355 mL), one 5 oz glass of wine (148 mL), or one 1 oz glass of hard liquor (44 mL). General information  Avoid eating more than 2,300 mg of salt a day.  If you have hypertension, you may need to reduce your sodium intake to 1,500 mg a day.  Work with your health care provider to maintain a healthy body weight or to lose weight. Ask what an ideal weight is for you.  Get at least 30 minutes of exercise that causes your heart to beat faster (aerobic exercise) most days of the week. Activities may include walking, swimming, or biking.  Work with your health care provider or dietitian to adjust your eating plan to your individual calorie needs. What foods should I eat? Fruits All fresh, dried, or frozen fruit. Canned fruit in natural juice (without added sugar). Vegetables Fresh or frozen vegetables (raw, steamed, roasted, or grilled). Low-sodium or reduced-sodium tomato and vegetable juice. Low-sodium or reduced-sodium tomato sauce and tomato paste. Low-sodium or reduced-sodium canned vegetables. Grains Whole-grain or whole-wheat bread. Whole-grain or whole-wheat pasta. Brown rice. Orpah Akira Perusse. Bulgur. Whole-grain and low-sodium cereals. Pita bread. Low-fat, low-sodium crackers. Whole-wheat flour tortillas. Meats and other proteins Skinless chicken or [place]. Ground chicken or [place]. Pork with fat trimmed off.  Fish and seafood. Egg whites. Dried beans, peas, or lentils. Unsalted nuts, nut butters, and seeds. Unsalted canned beans. Lean cuts of beef with fat trimmed off. Low-sodium, lean precooked or cured meat, such as sausages or meat loaves. Dairy Low-fat (1%) or fat-free (skim) milk. Reduced-fat, low-fat, or fat-free cheeses. Nonfat, low-sodium ricotta or cottage cheese. Low-fat or nonfat yogurt. Low-fat, low-sodium cheese. Fats and oils Soft margarine without trans fats. Vegetable oil. Reduced-fat, low-fat, or light mayonnaise and salad dressings (reduced-sodium). Canola, safflower, olive, avocado, soybean, and sunflower oils. Avocado. Seasonings and condiments Herbs. Spices. Seasoning mixes without salt. Other foods Unsalted popcorn and pretzels. Fat-free sweets. The items listed above may not be a complete list of foods and beverages you can eat. Contact a dietitian for more information. What foods should I avoid? Fruits Canned fruit in a light or heavy syrup. Fried fruit. Fruit in cream or butter sauce. Vegetables Creamed or fried vegetables. Vegetables in a cheese sauce. Regular canned vegetables (not low-sodium or reduced-sodium). Regular canned tomato sauce and paste (not low-sodium or reduced-sodium). Regular tomato and vegetable juice (not low-sodium or reduced-sodium). Monique Edwards. Olives. Grains Baked goods made with fat, such as croissants, muffins, or some breads. Dry pasta or rice meal packs. Meats and other proteins Fatty cuts of meat. Ribs. Fried meat. Tomasa Blase. Bologna, salami, and other precooked or cured meats, such as sausages or meat loaves. Fat from the back of a pig (fatback). Bratwurst. Salted nuts and seeds. Canned beans with added salt. Canned or smoked fish. Whole eggs or egg yolks. Chicken or [place] with skin. Dairy Whole or 2% milk, cream, and half-and-half. Whole or full-fat cream cheese. Whole-fat or sweetened yogurt. Full-fat cheese. Nondairy creamers. Whipped toppings.  Processed cheese and cheese spreads. Fats and oils Butter. Stick margarine. Lard. Shortening. Ghee. Bacon fat. Tropical oils, such as coconut, palm kernel, or palm oil. Seasonings and condiments Onion salt, garlic salt, seasoned salt, table salt, and sea salt. Worcestershire sauce. Tartar sauce. Barbecue sauce. Teriyaki sauce. Soy sauce, including reduced-sodium. Steak sauce. Canned and packaged gravies. Fish sauce. Oyster sauce. Cocktail sauce. Store-bought horseradish. Ketchup. Mustard. Meat flavorings and tenderizers. Bouillon cubes. Hot sauces. Pre-made or packaged marinades. Pre-made or packaged taco seasonings. Relishes. Regular salad dressings. Other foods Salted popcorn and pretzels. The items listed above may not be a complete list of foods and beverages you should avoid. Contact a dietitian for more information. Where to find more information  National Heart, Lung, and Blood Institute: PopSteam.is  American Heart Association: www.heart.org  Academy of Nutrition and Dietetics: www.eatright.org  National Kidney Foundation: www.kidney.org Summary  The DASH eating plan is a healthy eating plan that has been shown to reduce high blood pressure (hypertension). It may also reduce your risk for type 2 diabetes, heart disease, and stroke.  When on the DASH eating plan, aim to eat more fresh fruits and vegetables, whole grains, lean proteins, low-fat dairy, and heart-healthy fats.  With the DASH eating plan, you should limit salt (sodium) intake to 2,300 mg a day. If you have hypertension, you may need to reduce your sodium intake to 1,500 mg a day.  Work with your health care provider or dietitian to adjust your eating plan to your individual calorie needs. This information is not intended to replace advice given to you by your health care provider. Make sure you discuss any questions you have with your health care provider. Document Revised: 03/27/2019 Document Reviewed:  03/27/2019 Elsevier Patient Education  2021 [employer].

## 2020-08-26 ENCOUNTER — Other Ambulatory Visit: Payer: Self-pay

## 2020-08-26 DIAGNOSIS — Z860100 Personal history of colon polyps, unspecified: Secondary | ICD-10-CM | POA: Insufficient documentation

## 2020-08-26 NOTE — Progress Notes (Signed)
Procedure has been scheduled and updated instructions will be mailed to home address.

## 2020-08-29 ENCOUNTER — Ambulatory Visit (INDEPENDENT_AMBULATORY_CARE_PROVIDER_SITE_OTHER): Payer: Medicaid Other | Admitting: Primary Care

## 2020-08-29 ENCOUNTER — Other Ambulatory Visit: Payer: Self-pay

## 2020-08-29 ENCOUNTER — Telehealth: Payer: Self-pay | Admitting: Pulmonary Disease

## 2020-08-29 DIAGNOSIS — J441 Chronic obstructive pulmonary disease with (acute) exacerbation: Secondary | ICD-10-CM

## 2020-08-29 DIAGNOSIS — J069 Acute upper respiratory infection, unspecified: Secondary | ICD-10-CM

## 2020-08-29 MED ORDER — PREDNISONE 10 MG PO TABS
ORAL_TABLET | ORAL | 0 refills | Status: DC
  Filled 2020-08-29: qty 30, 12d supply, fill #0

## 2020-08-29 MED ORDER — FLUTICASONE PROPIONATE 50 MCG/ACT NA SUSP
1.0000 | Freq: Every day | NASAL | 2 refills | Status: DC
  Filled 2020-08-29: qty 16, 60d supply, fill #0

## 2020-08-29 NOTE — Telephone Encounter (Signed)
Pt has not been seen since 05/13/19. Pt needs to have an OV scheduled prior to [place] sending meds in.  Called and spoke with pt who states she woke up this morning 4/25 having complaints of cough, stuffy nose, chest tightness, increased SOB, and sore throat.  Pt is not sure if she has any fever as she said that she does feel clammy bur does not have a thermometer that she can use to check her temp. Pt has not taken a covid test. Pt has received her covid vaccines except for the booster. States that she received the Moderna vaccine.  Pt states that her grandchild lives with her and they recently had a cold so she thinks that she may have gotten something from her.  Stated to pt that we needed to schedule her for a televisit for further recommendations due to her symptoms and since her last OV was 05/2019. Pt verbalized understanding and has been scheduled for a televisit today with [name]. Nothing further needed.

## 2020-08-29 NOTE — Progress Notes (Signed)
Virtual Visit via Telephone Note  I connected with Monique Edwards on 08/29/20 at  4:00 PM EDT by telephone and verified that I am speaking with the correct person using two identifiers.  Location: Patient: Home Provider: Office    I discussed the limitations, risks, security and privacy concerns of performing an evaluation and management service by telephone and the availability of in person appointments. I also discussed with the patient that there may be a patient responsible charge related to this service. The patient expressed understanding and agreed to proceed.   History of Present Illness: 55 year old, current some day smoker. PMH significant for COPD with emphysema, hypertension, GERD, alcohol abuse, elevated liver enzymes, major depressive disorder, obesity, thrombocytopenia. Patient of Dr. Nicholos Johns, last seen on 11/13/18. Maintained on Incruse and started on Advair.   Previous [facility] pulmonary encounter:  05/13/2019 Patient contacted today for 6 month follow-up. Her breathing is at baseline, no significant changes. She experiences shortness of breath with exertion. Developed productive cough with yellow mucus three days ago with associated mild wheezing. She is compliant with Advair and Incruse. She has required her albuterol rescue inhaler twice in the last two months. She is still smoking about 5 cigarettes a month.   08/29/2020 - interm hx  Patient contacted today for acute televisit. Reports new onset of cough, stuffy nose, chest tightness , increased sob and sore throat starting this morning. Her grandchildren have also been sick. She is compliant with Advair 1 puff twice daily and Incruse. Inhalers were refilled by PCP. She has been requiring albuterol rescue inhaler twice a day. She has a baseline cough with beige-grey mucus. This is not new. Recently started taking Zyrtec several days ago. She is still smoking 6 cigarettes 1-2 times a week.    Observations/Objective:  No thermometer  or oximeter BP 132/71, HR 70  Assessment and Plan:  Upper respiratory infection/ COPD exacerbation - Likely exacerbated by viral illness or seasonal allergies - RX prendisone taper - Continue antihistamine and add ICS nasal spray  - Notify office if develops purulent mucus or fevr   Follow Up Instructions:  - If symptoms worsen    I discussed the assessment and treatment plan with the patient. The patient was provided an opportunity to ask questions and all were answered. The patient agreed with the plan and demonstrated an understanding of the instructions.   The patient was advised to call back or seek an in-person evaluation if the symptoms worsen or if the condition fails to improve as anticipated.  I provided 22 minutes of non-face-to-face time during this encounter.   Glenford Bayley, NP

## 2020-08-30 ENCOUNTER — Other Ambulatory Visit: Payer: Self-pay

## 2020-09-02 ENCOUNTER — Encounter: Payer: Self-pay | Admitting: Primary Care

## 2020-09-02 ENCOUNTER — Ambulatory Visit (INDEPENDENT_AMBULATORY_CARE_PROVIDER_SITE_OTHER): Payer: Medicaid Other | Admitting: Primary Care

## 2020-09-02 ENCOUNTER — Telehealth: Payer: Self-pay | Admitting: Pulmonary Disease

## 2020-09-02 DIAGNOSIS — J441 Chronic obstructive pulmonary disease with (acute) exacerbation: Secondary | ICD-10-CM

## 2020-09-02 MED ORDER — AMOXICILLIN-POT CLAVULANATE 875-125 MG PO TABS: 1.0000 | ORAL_TABLET | Freq: Two times a day (BID) | ORAL | 0 refills | Status: DC

## 2020-09-02 NOTE — Telephone Encounter (Signed)
Please put her in televisit today 4:30pm with me

## 2020-09-02 NOTE — Telephone Encounter (Signed)
[facility] patient, last seen [name] on 08/29/2020 for COPD. Pending OV 09/27/2020  Called and spoke to patient.  Patient reports of productive cough with yellow sputum, wheezing, increased sob, sore throat, chills, sweats and chest soreness when coughing. Sx have been present for 1 week.   She is unsure if she has a fever, as she is unable to measure her temp.  She is using albuterol HFA and Advair BID. She started Mucinex yesterday, which seems to be helping some. Patient does not have supplemental oxygen.  Currently taking prednisone taper that was prescribed on 08/29/2020. She has one week left of taper.  She has had two covid vaccines-scheduled for booster this weekend. No vaccinated against flu.   [name], please advise. Thanks.

## 2020-09-02 NOTE — Telephone Encounter (Signed)
Lm for patient.  

## 2020-09-02 NOTE — Telephone Encounter (Signed)
Patient is aware of appt. She voiced her understanding and had no further questions. Nothing further needed at this time.

## 2020-09-02 NOTE — Progress Notes (Signed)
Virtual Visit via Telephone Note  I connected with Monique Edwards on 09/02/20 at  4:30 PM EDT by telephone and verified that I am speaking with the correct person using two identifiers.  Location: Patient: Home Provider: Office   I discussed the limitations, risks, security and privacy concerns of performing an evaluation and management service by telephone and the availability of in person appointments. I also discussed with the patient that there may be a patient responsible charge related to this service. The patient expressed understanding and agreed to proceed.   History of Present Illness:  55 year old, current some day smoker. PMH significant for COPD with emphysema, hypertension, GERD, alcohol abuse, elevated liver enzymes, major depressive disorder, obesity, thrombocytopenia. Patient of Dr. Nicholos Johns, last seen on 11/13/18. Maintained on Incruse and started on Advair.   Previous [facility] encounter:  05/13/2019 Patient contacted today for 6 month follow-up. Her breathing is at baseline, no significant changes. She experiences shortness of breath with exertion. Developed productive cough with yellow mucus three days ago with associated mild wheezing. She is compliant with Advair and Incruse. She has required her albuterol rescue inhaler twice in the last two months. She is still smoking about 5 cigarettes a month.   08/29/2020 Patient contacted today for acute televisit. Reports new onset of cough, stuffy nose, chest tightness , increased sob and sore throat starting this morning. Her grandchildren have also been sick. She is compliant with Advair 1 puff twice daily and Incruse. Inhalers were refilled by PCP. She has been requiring albuterol rescue inhaler twice a day. She has a baseline cough with beige-grey mucus. This is not new. Recently started taking Zyrtec several days ago. She is still smoking 6 cigarettes 1-2 times a week.   09/02/2020 Patient contacted today for acute visit d.t  persistent cough x 4 days. Associated shortness of breath, wheezing and chest tightness. Cough has turned productive with thick yellow mucus. She is compliant with Advair 250 one puffs twice daily and Incruse inhaler. She was given Proventil Hfa which she has been using twice daily. She started prednisone taper that we prescribed at last visit. She has been taking Mucinex. No known fevers, chills, sweats, hemoptysis.   Observations/Objective:  - Able to speak in mostly full sentences;  moderate dyspnea while moving/walking  Assessment and Plan:  COPD exacerbation: - Patient continues to have productive cough with associated sob/wheezing. Cough is now purulent with yellow mucus  - Currently on prednisone taper (40mg  x 3 days; 30mg  x 3 days; 20mg  x 3 days; 10mg  x 3 days) - Rx Augmentin 1 tab twice daily x 7 days  - Continue Advair 250-50mcg one puff BID, Incruse Ellipta once daily and Proventil 2 puffs q4-6 hours as needed for breakthrough shortness of breath/wheezing  - Continue Zyrtec, Mucinex 600mg  twice daily and Flonase nasal spray - If not better needs OV with CXR  Follow Up Instructions:   - Follow-up if symptoms do not improve or worsen   I discussed the assessment and treatment plan with the patient. The patient was provided an opportunity to ask questions and all were answered. The patient agreed with the plan and demonstrated an understanding of the instructions.   The patient was advised to call back or seek an in-person evaluation if the symptoms worsen or if the condition fails to improve as anticipated.  I provided 18 minutes of non-face-to-face time during this encounter.   , NP

## 2020-09-02 NOTE — Telephone Encounter (Signed)
Phone visit scheduled for 09/02/2020 at 4;30. Lm for patient.

## 2020-09-06 ENCOUNTER — Other Ambulatory Visit: Payer: Self-pay

## 2020-09-13 ENCOUNTER — Encounter: Payer: Self-pay | Admitting: Certified Registered"

## 2020-09-13 ENCOUNTER — Encounter: Admission: RE | Payer: Self-pay | Source: Home / Self Care

## 2020-09-13 ENCOUNTER — Ambulatory Visit: Admission: RE | Admit: 2020-09-13 | Payer: Medicaid Other | Source: Home / Self Care | Admitting: Gastroenterology

## 2020-09-13 SURGERY — COLONOSCOPY WITH PROPOFOL
Anesthesia: General

## 2020-09-14 ENCOUNTER — Other Ambulatory Visit: Payer: Medicaid Other

## 2020-09-14 ENCOUNTER — Other Ambulatory Visit: Payer: Self-pay

## 2020-09-20 ENCOUNTER — Other Ambulatory Visit: Payer: Self-pay

## 2020-09-20 ENCOUNTER — Telehealth: Payer: Self-pay | Admitting: Pharmacist

## 2020-09-20 MED ORDER — ALBUTEROL SULFATE HFA 108 (90 BASE) MCG/ACT IN AERS
2.0000 | INHALATION_SPRAY | Freq: Four times a day (QID) | RESPIRATORY_TRACT | 3 refills | Status: DC
  Filled 2020-09-20: qty 25.5, 75d supply, fill #0
  Filled 2020-11-22 – 2021-01-06 (×2): qty 25.5, 75d supply, fill #1

## 2020-09-20 NOTE — Telephone Encounter (Signed)
09/20/2020 9:08:56 AM - [facility] called for refill  -- Rhetta Mura - Tuesday, Sep 20, 2020 9:08 AM --Alver Fisher spoke with [facility] for refill on Xifaxan, allow 3-5 business days to receive.

## 2020-09-22 ENCOUNTER — Ambulatory Visit: Payer: Medicaid Other | Admitting: Gerontology

## 2020-09-22 ENCOUNTER — Other Ambulatory Visit: Payer: Self-pay

## 2020-09-27 ENCOUNTER — Ambulatory Visit: Payer: Medicaid Other | Admitting: Adult Health

## 2020-09-29 ENCOUNTER — Other Ambulatory Visit: Payer: Self-pay

## 2020-09-29 ENCOUNTER — Other Ambulatory Visit: Payer: Self-pay | Admitting: Gerontology

## 2020-09-29 DIAGNOSIS — Z8709 Personal history of other diseases of the respiratory system: Secondary | ICD-10-CM

## 2020-09-29 MED ORDER — FLUTICASONE-SALMETEROL 250-50 MCG/ACT IN AEPB
1.0000 | INHALATION_SPRAY | Freq: Two times a day (BID) | RESPIRATORY_TRACT | 2 refills | Status: DC
  Filled 2020-09-29: qty 180, 90d supply, fill #0
  Filled 2020-12-13 – 2021-01-05 (×3): qty 180, 90d supply, fill #1

## 2020-09-30 ENCOUNTER — Other Ambulatory Visit: Payer: Self-pay

## 2020-10-04 ENCOUNTER — Ambulatory Visit: Payer: Medicaid Other | Admitting: Gerontology

## 2020-10-06 ENCOUNTER — Encounter: Payer: Self-pay | Admitting: Gerontology

## 2020-10-06 ENCOUNTER — Other Ambulatory Visit: Payer: Self-pay

## 2020-10-06 ENCOUNTER — Ambulatory Visit: Payer: Medicaid Other | Admitting: Gerontology

## 2020-10-06 VITALS — BP 100/74 | HR 63 | Temp 98.2°F | Resp 16 | Ht 64.0 in | Wt 177.4 lb

## 2020-10-06 DIAGNOSIS — J441 Chronic obstructive pulmonary disease with (acute) exacerbation: Secondary | ICD-10-CM

## 2020-10-06 DIAGNOSIS — Z0000 Encounter for general adult medical examination without abnormal findings: Secondary | ICD-10-CM

## 2020-10-06 DIAGNOSIS — I10 Essential (primary) hypertension: Secondary | ICD-10-CM

## 2020-10-06 NOTE — Patient Instructions (Signed)

## 2020-10-06 NOTE — Progress Notes (Signed)
Established Patient Office Visit  Subjective:  Patient ID: Monique Edwards, female    DOB: 12-28-1965  Age: 55 y.o. MRN: [id number]  CC:  Chief Complaint  Patient presents with  . Follow-up  . Hypertension  . COPD    HPI Monique Edwards is a 55 y/o female who has history of COPD, Hypertension, GERD, Fatty Liver, presents for routine follow up and medication refill. She was treated for Upper respiratory infection/COPD exacerbation with prednisone taper and antibiotics on 09/02/20 by Kandy Garrison NP. Currently, her breathing is stable, denies wheezing, chest tightness, shortness of breath and cough. She continues to smoke 5 cigarettes in a month and admits the desire to quit. She states that she's compliant with her medications, denies any side effects and continues to make healthy life style changes. She does not check her blood pressure at home. Overall, she states that she will complete her [facility] financial application so to reschedule her Colonoscopy ,will equally schedule for Mammogram and she denies any further complaint.  Past Medical History:  Diagnosis Date  . COPD (chronic obstructive pulmonary disease) ([place])   . Fatty liver   . GERD (gastroesophageal reflux disease)   . Hypertension     Past Surgical History:  Procedure Laterality Date  . CHOLECYSTECTOMY    . ESOPHAGOGASTRODUODENOSCOPY (EGD) WITH PROPOFOL N/A 06/06/2018   Procedure: ESOPHAGOGASTRODUODENOSCOPY (EGD) WITH PROPOFOL;  Surgeon: Jonathon Bellows, MD;  Location: [facility] ENDOSCOPY;  Service: Gastroenterology;  Laterality: N/A;    Family History  Problem Relation Age of Onset  . Hypertension Mother   . Stroke Mother   . Diabetes Father   . Arthritis Sister   . Thyroid disease Sister   . Hypertension Brother   . Hypertension Brother     Social History   Socioeconomic History  . Marital status: Widowed    Spouse name: Not on file  . Number of children: Not on file  . Years of education: Not on file  . Highest education  level: Not on file  Occupational History  . Occupation: Unemployed  Tobacco Use  . Smoking status: Current Some Day Smoker    Years: 16.00    Types: Cigarettes    Start date: 11/04/1980  . Smokeless tobacco: Never Used  . Tobacco comment: 6-7 cigarettes per week  Vaping Use  . Vaping Use: Never used  Substance and Sexual Activity  . Alcohol use: Yes    Comment: 2-3 beers every few weeks  . Drug use: Not Currently    Types: Cocaine    Comment: last use 2021  . Sexual activity: Yes    Partners: Male  Other Topics Concern  . Not on file  Social History Narrative  . Not on file   Social Determinants of Health   Financial Resource Strain: Not on file  Food Insecurity: No Food Insecurity  . Worried About Charity fundraiser in the Last Year: Never true  . Ran Out of Food in the Last Year: Never true  Transportation Needs: No Transportation Needs  . Lack of Transportation (Medical): No  . Lack of Transportation (Non-Medical): No  Physical Activity: Not on file  Stress: Not on file  Social Connections: Not on file  Intimate Partner Violence: Not on file    Outpatient Medications Prior to Visit  Medication Sig Dispense Refill  . albuterol (PROAIR HFA) 108 (90 Base) MCG/ACT inhaler Inhale 2 puffs into the lungs every 6 (six) hours as needed for wheezing or shortness of breath. 34 g  3  . Blood Pressure Monitor KIT 1 kit by Does not apply route daily. 1 kit 0  . cetirizine (ZYRTEC) 10 MG tablet Take 1 tablet (10 mg total) by mouth daily. 30 tablet 2  . fluticasone-salmeterol (ADVAIR) 250-50 MCG/ACT AEPB Inhale 1 puff into the lungs in the morning and at bedtime. 180 each 2  . ibuprofen (ADVIL) 200 MG tablet Take 800 mg by mouth every 6 (six) hours as needed.    . lactulose (CHRONULAC) 10 GM/15ML solution TAKE 15MLS BY MOUTH 3 TIMES A DAY 4257 mL 1  . losartan (COZAAR) 25 MG tablet TAKE ONE TABLET BY MOUTH EVERY DAY 90 tablet 1  . omeprazole (PRILOSEC) 20 MG capsule Take 1 capsule  (72m) by mouth every day 90 capsule 1  . umeclidinium bromide (INCRUSE ELLIPTA) 62.5 MCG/INH AEPB INHALE ONE PUFF INTO THE LUNGS ONCE DAILY. 30 each 3  . polyethylene glycol (GOLYTELY) 236 g solution Drink 8 oz every 20-30 minutes until entire prep is finished 4000 mL 0  . amoxicillin-clavulanate (AUGMENTIN) 875-125 MG tablet Take 1 tablet by mouth 2 (two) times daily. 14 tablet 0  . ferrous sulfate 325 (65 FE) MG EC tablet Take 325 mg by mouth daily with breakfast. (Patient not taking: Reported on 10/06/2020)    . fluticasone (FLONASE) 50 MCG/ACT nasal spray Spray 1 spray into both nostrils once daily. (Patient not taking: Reported on 10/06/2020) 16 g 2  . melatonin 3 MG TABS tablet Take 1 tablet (3 mg total) by mouth at bedtime as needed. (Patient not taking: Reported on 10/06/2020) 30 tablet 0  . naproxen sodium (ALEVE) 220 MG tablet Take 440 mg by mouth daily as needed (Knee/hip pain).    . nystatin (MYCOSTATIN) 100000 UNIT/ML suspension Take 5 mLs (500,000 Units total) by mouth 4 (four) times daily. 60 mL 0  . predniSONE (DELTASONE) 10 MG tablet Take 4 tablets by mouth once daily for 3 days. Then take 3 tablets by mouth daily for 3 days. Then take 2 tablets daily for 3 days. Then take 1 tablet daily for 3 days. Then stop. (Patient taking differently: Take 4 tablets by mouth once daily for 3 days. Then take 3 tablets by mouth daily for 3 days. Then take 2 tablets daily for 3 days. Then take 1 tablet daily for 3 days. Then stop.) 30 tablet 0  . rifaximin (XIFAXAN) 550 MG TABS tablet Take 1 tablet (550 mg total) by mouth 2 (two) times daily. 180 tablet 1   No facility-administered medications prior to visit.    Allergies  Allergen Reactions  . Amlodipine Swelling    Peripheral edema  . Lisinopril Cough    ROS Review of Systems  Constitutional: Negative.   Eyes: Negative.   Respiratory: Negative.   Cardiovascular: Negative.   Skin: Negative.   Neurological: Negative.   Hematological:  Negative.   Psychiatric/Behavioral: Negative.       Objective:    Physical Exam HENT:     Head: Normocephalic and atraumatic.     Mouth/Throat:     Mouth: Mucous membranes are moist.  Eyes:     Extraocular Movements: Extraocular movements intact.     Conjunctiva/sclera: Conjunctivae normal.     Pupils: Pupils are equal, round, and reactive to light.  Cardiovascular:     Rate and Rhythm: Normal rate and regular rhythm.     Pulses: Normal pulses.     Heart sounds: Normal heart sounds.  Pulmonary:     Effort: Pulmonary effort is  normal.     Breath sounds: Normal breath sounds.  Skin:    General: Skin is warm.  Neurological:     General: No focal deficit present.     Mental Status: She is alert and oriented to person, place, and time. Mental status is at baseline.  Psychiatric:        Mood and Affect: Mood normal.        Behavior: Behavior normal.        Thought Content: Thought content normal.        Judgment: Judgment normal.     BP 100/74 (BP Location: Right Arm, Patient Position: Sitting, Cuff Size: Normal) Comment: manual  Pulse 63   Temp 98.2 F (36.8 C)   Resp 16   Ht 5' 4" (1.626 m)   Wt 177 lb 6.4 oz (80.5 kg)   LMP 02/28/2014 (Approximate)   SpO2 96%   BMI 30.45 kg/m  Wt Readings from Last 3 Encounters:  10/06/20 177 lb 6.4 oz (80.5 kg)  08/25/20 182 lb 8 oz (82.8 kg)  04/12/20 178 lb (80.7 kg)   Weight loss encouraged.  Health Maintenance Due  Topic Date Due  . COVID-19 Vaccine (1) Never done  . HIV Screening  Never done  . TETANUS/TDAP  Never done  . COLONOSCOPY (Pts 45-39yr Insurance coverage will need to be confirmed)  Never done  . Zoster Vaccines- Shingrix (1 of 2) Never done  . MAMMOGRAM  01/30/2020    There are no preventive care reminders to display for this patient.  Lab Results  Component Value Date   TSH 2.130 03/19/2018   Lab Results  Component Value Date   WBC 3.5 04/12/2020   HGB 12.1 04/12/2020   HCT 34.8 04/12/2020    MCV 90 04/12/2020   PLT 75 (LL) 04/12/2020   Lab Results  Component Value Date   NA 141 04/12/2020   K 3.9 04/12/2020   CO2 21 04/12/2020   GLUCOSE 92 04/12/2020   BUN 15 04/12/2020   CREATININE 0.76 04/12/2020   BILITOT 0.3 04/12/2020   ALKPHOS 55 04/12/2020   AST 21 04/12/2020   ALT 17 04/12/2020   PROT 6.7 04/12/2020   ALBUMIN 4.1 04/12/2020   CALCIUM 9.2 04/12/2020   ANIONGAP 13 07/04/2019   Lab Results  Component Value Date   CHOL 168 03/11/2019   Lab Results  Component Value Date   HDL 76 03/11/2019   Lab Results  Component Value Date   LDLCALC 77 03/11/2019   Lab Results  Component Value Date   TRIG 81 03/11/2019   Lab Results  Component Value Date   CHOLHDL 2.2 03/11/2019   Lab Results  Component Value Date   HGBA1C 5.0 03/11/2019      Assessment & Plan:    1. Healthcare maintenance -Routine labs will be checked - CBC w/Diff; Future - Comp Met (CMET); Future - Lipid panel; Future - HgB A1c; Future  2. Primary hypertension -Her blood pressure is under control, will continue on current medication, DASH diet and exercise as tolerated.  3. COPD exacerbation ([place] -Her breathing is under control, will continue on current medication and advised on smoking cessation, provided [place] Quit line information.    Follow-up: Return in about 5 months (around 03/08/2021).    Graiden Henes EJerold Coombe NP

## 2020-10-11 ENCOUNTER — Other Ambulatory Visit: Payer: Self-pay | Admitting: Primary Care

## 2020-10-11 ENCOUNTER — Other Ambulatory Visit: Payer: Self-pay

## 2020-10-11 MED ORDER — FLUTICASONE PROPIONATE 50 MCG/ACT NA SUSP
1.0000 | Freq: Every day | NASAL | 2 refills | Status: DC
  Filled 2020-10-11 – 2020-11-22 (×2): qty 16, 60d supply, fill #0

## 2020-10-13 ENCOUNTER — Telehealth: Payer: Self-pay | Admitting: Emergency Medicine

## 2020-10-13 NOTE — Telephone Encounter (Signed)
Patient called in today stating that she was having flank pain. Patient denies any urinary frequency or dysuria. Patient does state she is constipated. Patient's last BM was today, but she states the stool was very hard. After reviewing patient medication list, she is prescribed Lactulose. Patient denies taking because it keeps her in the bathroom all the time. Advised patient to re-start and once she started having a normal BM, she could decrease if needed. Also advised patient to increase water intake. Patient agreed and voiced understanding.

## 2020-10-13 NOTE — Telephone Encounter (Signed)
-----   Message from Elberta Fortis sent at 10/13/2020 12:39 PM EDT ----- Regarding: Return Call to Patient Patient called wanting to speak to the nurse

## 2020-10-17 ENCOUNTER — Other Ambulatory Visit: Payer: Self-pay

## 2020-10-17 ENCOUNTER — Telehealth: Payer: Self-pay | Admitting: Gastroenterology

## 2020-10-17 DIAGNOSIS — Z86010 Personal history of colonic polyps: Secondary | ICD-10-CM

## 2020-10-17 MED ORDER — PEG 3350-KCL-NA BICARB-NACL 420 G PO SOLR
ORAL | 0 refills | Status: DC
  Filled 2020-10-17: qty 4000, 1d supply, fill #0

## 2020-10-17 NOTE — Telephone Encounter (Signed)
Patient called back and she agreed on doing her colonoscopy on 10/31/2020 at [facility]. Her new instructions will be mailed and her pep was sen to her pharmacy [facility].

## 2020-10-17 NOTE — Telephone Encounter (Signed)
Patient called and is ready to schedule her procedure. 

## 2020-10-17 NOTE — Telephone Encounter (Signed)
Called patient and had to leave her a voicemail to call me back so I could reschedule her colonoscopy.

## 2020-10-18 ENCOUNTER — Other Ambulatory Visit: Payer: Self-pay

## 2020-10-31 ENCOUNTER — Encounter: Admission: RE | Payer: Self-pay | Source: Home / Self Care

## 2020-10-31 ENCOUNTER — Encounter: Payer: Self-pay | Admitting: Registered Nurse

## 2020-10-31 ENCOUNTER — Ambulatory Visit: Admission: RE | Admit: 2020-10-31 | Payer: Medicaid Other | Source: Home / Self Care | Admitting: Gastroenterology

## 2020-10-31 SURGERY — COLONOSCOPY WITH PROPOFOL
Anesthesia: General

## 2020-11-02 ENCOUNTER — Other Ambulatory Visit: Payer: Self-pay

## 2020-11-02 ENCOUNTER — Ambulatory Visit: Payer: Medicaid Other | Admitting: Primary Care

## 2020-11-08 ENCOUNTER — Ambulatory Visit: Payer: Medicaid Other | Admitting: Specialist

## 2020-11-09 ENCOUNTER — Ambulatory Visit: Payer: Medicaid Other | Attending: Oncology | Admitting: *Deleted

## 2020-11-09 ENCOUNTER — Encounter: Payer: Self-pay | Admitting: *Deleted

## 2020-11-09 ENCOUNTER — Other Ambulatory Visit: Payer: Self-pay

## 2020-11-09 ENCOUNTER — Ambulatory Visit
Admission: RE | Admit: 2020-11-09 | Discharge: 2020-11-09 | Disposition: A | Discharge status: Home / Self Care | Payer: Medicaid Other | Source: Ambulatory Visit | Attending: Oncology | Admitting: Oncology

## 2020-11-09 VITALS — BP 129/73 | HR 49 | Temp 97.2°F | Ht 63.0 in | Wt 179.9 lb

## 2020-11-09 DIAGNOSIS — Z0000 Encounter for general adult medical examination without abnormal findings: Secondary | ICD-10-CM

## 2020-11-09 NOTE — Patient Instructions (Signed)
Gave patient hand-out, Women Staying Healthy, Active and Well from [facility], with education on breast health, pap smears, heart and colon health. 

## 2020-11-09 NOTE — Progress Notes (Signed)
  Subjective:     Patient ID: Monique Edwards, female   DOB: February 24, 1966, 55 y.o.   MRN: [id number]  HPI  [facility] Medical History Record - 11/09/20 1120       Breast History   Screening cycle New    Provider ([facility]) [facility]    Initial Mammogram 11/09/20    Last Mammogram Annual    Last Mammogram Date 01/29/18    Provider (Mammogram)  Delf[facility] Field    Recent Breast Symptoms None      Breast Cancer History   Breast Cancer History No personal or family history      Previous History of Breast Problems   Breast Surgery or Biopsy None    Breast Implants N/A    BSE Done Never      Gynecological/Obstetrical History   LMP --   44   Is there any chance that the client could be pregnant?  No    Age at menarche 65    Age at menopause 23    PAP smear history Annually    Date of last PAP  01/29/18    Provider (PAP) [facility]    Age at first live birth 41    Breast fed children No    DES Exposure No    Cervical, Uterine or Ovarian cancer No    Family history of Cervial, Uterine or Ovarian cancer No    Hysterectomy No    Cervix removed No    Ovaries removed No    Laser/Cryosurgery No    Current method of birth control None    Current method of Estrogen/Hormone replacement None    Smoking history --   1/2 pack per week; Given cessation class info   Comments no insurance               Review of Systems     Objective:   Physical Exam Chest:  Breasts:    Right: No swelling, bleeding, inverted nipple, mass, nipple discharge, skin change, tenderness, axillary adenopathy or supraclavicular adenopathy.     Left: No swelling, bleeding, inverted nipple, mass, nipple discharge, skin change, tenderness, axillary adenopathy or supraclavicular adenopathy.    Lymphadenopathy:     Upper Body:     Right upper body: No supraclavicular or axillary adenopathy.     Left upper body: No supraclavicular or axillary adenopathy.      Assessment:     55 year old female [facility]  screening.  Clinical breast exam unremarkable.  Taught self breast awareness.  Last pap on 01/29/18 was negative / negative.  Next pap due in 2024.  Patient has been screened for eligibility.  She does not have any insurance, Medicare or Medicaid.  She also meets financial eligibility.   Risk Assessment     Risk Scores       11/09/2020 01/29/2018   Last edited by: Scarlett Presto, RN Scarlett Presto, RN   5-year risk: 0.9 % 0.8 %   Lifetime risk: 5.9 % 6.2 %               Plan:     Screening mammogram ordered.  Will follow up per [facility] protocol.

## 2020-11-21 ENCOUNTER — Telehealth: Payer: Self-pay | Admitting: Pulmonary Disease

## 2020-11-21 MED ORDER — DOXYCYCLINE HYCLATE 100 MG PO TABS
100.0000 mg | ORAL_TABLET | Freq: Two times a day (BID) | ORAL | 0 refills | Status: DC
  Filled 2020-11-21: qty 14, 7d supply, fill #0

## 2020-11-21 MED ORDER — PREDNISONE 10 MG PO TABS
ORAL_TABLET | ORAL | 0 refills | Status: DC
Start: 2020-11-21 — End: 2021-01-26
  Filled 2020-11-21: qty 14, fill #0
  Filled 2020-11-22: qty 14, 6d supply, fill #0

## 2020-11-21 NOTE — Telephone Encounter (Signed)
[name], please advise. Thanks 

## 2020-11-21 NOTE — Telephone Encounter (Signed)
Spoke to patient, who reports of chest/nasal congestion, trouble taking a deep breath, prod cough with tan sputum and wheezing x2d. Denies fever, chills or sweats.  She does not have supplemental oxygen. She is using OTC cold & flu, albuterol HFA BID, incruse daily and advair BID with no relief in sx.   Dr. Jayme Cloud, please advise. Thanks

## 2020-11-21 NOTE — Telephone Encounter (Signed)
Please send in Doxycycline 100mg  BID x 7 days and prednisone 40mg  x 2 days, 20mg  x 2 days, 10mg  x 2 days. Continue Advair 1 puff BID and Incruse once daily. Have her take mucinex 600-1200mg  twice a daily with full glass of water and flonase nasal spray

## 2020-11-21 NOTE — Telephone Encounter (Signed)
Patient is aware of recommendations and voiced her understanding.  Rx for doxy and prednisone has been sent to preferred pharmacy. Nothing further needed at this time.

## 2020-11-22 ENCOUNTER — Other Ambulatory Visit: Payer: Self-pay

## 2020-11-29 ENCOUNTER — Ambulatory Visit: Payer: Medicaid Other | Admitting: Specialist

## 2020-11-29 ENCOUNTER — Other Ambulatory Visit: Payer: Self-pay | Admitting: Gerontology

## 2020-11-29 ENCOUNTER — Other Ambulatory Visit: Payer: Self-pay

## 2020-11-29 MED FILL — Omeprazole Cap Delayed Release 20 MG: ORAL | 90 days supply | Qty: 90 | Fill #0 | Status: CN

## 2020-11-30 ENCOUNTER — Other Ambulatory Visit: Payer: Self-pay

## 2020-11-30 ENCOUNTER — Ambulatory Visit (INDEPENDENT_AMBULATORY_CARE_PROVIDER_SITE_OTHER): Payer: Medicaid Other | Admitting: Primary Care

## 2020-11-30 ENCOUNTER — Encounter: Payer: Self-pay | Admitting: Primary Care

## 2020-11-30 DIAGNOSIS — J42 Unspecified chronic bronchitis: Secondary | ICD-10-CM

## 2020-11-30 MED ORDER — DALIRESP 250 MCG PO TABS
1.0000 | ORAL_TABLET | Freq: Every day | ORAL | 0 refills | Status: DC
  Filled 2020-11-30 – 2021-04-24 (×2): qty 28, fill #0

## 2020-11-30 NOTE — Progress Notes (Signed)
$'@Patient'b$  ID: Monique Edwards, female    DOB: [birth year], 55 y.o.   MRN: [id number]  No chief complaint on file.   Referring provider: Langston Reusing, NP  HPI: 55 year old, current some day smoker. PMH significant for COPD with emphysema, hypertension, GERD, alcohol abuse, elevated liver enzymes, major depressive disorder, obesity, thrombocytopenia. Patient of Dr. Ashby Dawes. Maintained on Incruse and started on Advair.   Previous [facility] pulmonary encounter:  05/13/2019 Patient contacted today for 6 month follow-up. Her breathing is at baseline, no significant changes. She experiences shortness of breath with exertion. Developed productive cough with yellow mucus three days ago with associated mild wheezing. She is compliant with Advair and Incruse. She has required her albuterol rescue inhaler twice in the last two months. She is still smoking about 5 cigarettes a month.   08/29/2020 Patient contacted today for acute televisit. Reports new onset of cough, stuffy nose, chest tightness , increased sob and sore throat starting this morning. Her grandchildren have also been sick. She is compliant with Advair 1 puff twice daily and Incruse. Inhalers were refilled by PCP. She has been requiring albuterol rescue inhaler twice a day. She has a baseline cough with beige-grey mucus. This is not new. Recently started taking Zyrtec several days ago. She is still smoking 6 cigarettes 1-2 times a week.   09/02/2020 Patient contacted today for acute visit d.t persistent cough x 4 days. Associated shortness of breath, wheezing and chest tightness. Cough has turned productive with thick yellow mucus. She is compliant with Advair 250 one puffs twice daily and Incruse inhaler. She was given Proventil Hfa which she has been using twice daily. She started prednisone taper that we prescribed at last visit. She has been taking Mucinex. No known fevers, chills, sweats, hemoptysis.   11/30/2020- Interim hx  Patient  presents today for 3 month follow-up. Accompanied by her boyfriend. She was seen back in April for prolonged COPD exacerbations, treated with Augmentin and prednisone taper. She called our office in Mid-July with reports of congested cough, nasal congestion, SOB and wheezing x 2 days. We sent her in course of oral Doxycycline x 7 days and prednisone taper. She is almost back to her baseline. She is compliant with Incruse and Advair as prescribed. She has been using albuterol rescue inhaler 1-2 times daily for breakthrough symptoms. She gets her medications through med management.     Allergies  Allergen Reactions   Amlodipine Swelling    Peripheral edema   Lisinopril Cough    Immunization History  Administered Date(s) Administered   Hep A / Hep B 04/26/2020   Influenza, Seasonal, Injecte, Preservative Fre 03/09/2013   Influenza,inj,Quad PF,6+ Mos 03/04/2015, 02/27/2018, 03/11/2019   Influenza-Unspecified 03/23/2019    Past Medical History:  Diagnosis Date   COPD (chronic obstructive pulmonary disease) (HCC)    Fatty liver    GERD (gastroesophageal reflux disease)    Hypertension     Tobacco History: Social History   Tobacco Use  Smoking Status Some Days   Years: 16.00   Types: Cigarettes   Start date: 11/04/1980  Smokeless Tobacco Never  Tobacco Comments   5-6 cigarettes per week 11/30/2020   Ready to quit: Not Answered Counseling given: Not Answered Tobacco comments: 5-6 cigarettes per week 11/30/2020   Outpatient Medications Prior to Visit  Medication Sig Dispense Refill   albuterol (PROAIR HFA) 108 (90 Base) MCG/ACT inhaler Inhale 2 puffs into the lungs every 6 (six) hours as needed for wheezing or shortness  of breath. 34 g 3   Blood Pressure Monitor KIT 1 kit by Does not apply route daily. 1 kit 0   cetirizine (ZYRTEC) 10 MG tablet Take 1 tablet (10 mg total) by mouth once daily. 30 tablet 2   doxycycline (VIBRA-TABS) 100 MG tablet Take 1 tablet (100 mg total) by  mouth 2 (two) times daily. 14 tablet 0   fluticasone (FLONASE) 50 MCG/ACT nasal spray Spray 1 spray into both nostrils once daily. 16 g 2   fluticasone-salmeterol (ADVAIR) 250-50 MCG/ACT AEPB Inhale 1 puff into the lungs in the morning and at bedtime. 180 each 2   ibuprofen (ADVIL) 200 MG tablet Take 800 mg by mouth every 6 (six) hours as needed.     lactulose (CHRONULAC) 10 GM/15ML solution TAKE 15MLS BY MOUTH 3 TIMES A DAY 4257 mL 1   losartan (COZAAR) 25 MG tablet TAKE ONE TABLET BY MOUTH EVERY DAY 90 tablet 1   omeprazole (PRILOSEC) 20 MG capsule Take 1 capsule ($RemoveBe'20mg'TpPhGdRnX$ ) by mouth every day 90 capsule 1   polyethylene glycol (GOLYTELY) 236 g solution Drink 8 oz every 20-30 minutes until entire prep is finished 4000 mL 0   polyethylene glycol-electrolytes (NULYTELY) 420 g solution Prepare according to package instructions. Starting at 5:00 PM: Drink one 8 oz glass of mixture every 15 minutes until you finish half of the jug. Five hours prior to procedure, drink 8 oz glass of mixture every 15 minutes until it is all gone. Make sure you do not drink anything 4 hours prior to your procedure. 4000 mL 0   predniSONE (DELTASONE) 10 MG tablet TAKE 4 TABLETS ($RemoveBe'40MG'NXFwmLgjr$  TOTAL) BY MOUTH ONCE DAILY FOR 2 DAYS. THEN TAKE 2 TABLETS ($RemoveBe'20MG'SQotDekUu$ ) ONCE DAILY FOR 2 DAYS. THEN TAKE 1 TABLET ($RemoveB'10MG'xdOwSiQj$ ) BY MOUTH ONCE DAILY FOR 2 DAYS. THEN STOP. 14 tablet 0   umeclidinium bromide (INCRUSE ELLIPTA) 62.5 MCG/INH AEPB INHALE ONE PUFF INTO THE LUNGS ONCE DAILY. 30 each 3   No facility-administered medications prior to visit.    Review of Systems  Review of Systems  Constitutional: Negative.   HENT: Negative.    Respiratory:  Positive for cough and shortness of breath.   Cardiovascular: Negative.     Physical Exam  BP 120/80 (BP Location: Left Arm, Patient Position: Sitting, Cuff Size: Normal)   Pulse (!) 57   Temp 98.2 F (36.8 C) (Oral)   Ht $R'5\' 4"'bU$  (1.626 m)   Wt 184 lb 6.4 oz (83.6 kg)   LMP 02/28/2014 (Approximate)    SpO2 98%   BMI 31.65 kg/m  Physical Exam Constitutional:      Appearance: Normal appearance.  HENT:     Head: Normocephalic and atraumatic.     Mouth/Throat:     Comments: Deferred d/t masking Cardiovascular:     Rate and Rhythm: Normal rate and regular rhythm.  Pulmonary:     Effort: Pulmonary effort is normal.     Breath sounds: Normal breath sounds.  Skin:    General: Skin is warm and dry.  Neurological:     General: No focal deficit present.     Mental Status: She is alert and oriented to person, place, and time. Mental status is at baseline.  Psychiatric:        Mood and Affect: Mood normal.        Behavior: Behavior normal.        Thought Content: Thought content normal.     Lab Results:  CBC    Component Value  Date/Time   WBC 3.5 04/12/2020 1534   WBC 5.4 07/04/2019 0652   RBC 3.88 04/12/2020 1534   RBC 5.00 07/04/2019 0652   HGB 12.1 04/12/2020 1534   HCT 34.8 04/12/2020 1534   PLT 75 (LL) 04/12/2020 1534   MCV 90 04/12/2020 1534   MCH 31.2 04/12/2020 1534   MCH 30.2 07/04/2019 0652   MCHC 34.8 04/12/2020 1534   MCHC 33.3 07/04/2019 0652   RDW 12.6 04/12/2020 1534   LYMPHSABS 1.0 04/12/2020 1534   MONOABS 0.4 07/04/2019 0652   EOSABS 0.1 04/12/2020 1534   BASOSABS 0.0 04/12/2020 1534    BMET    Component Value Date/Time   NA 141 04/12/2020 1534   K 3.9 04/12/2020 1534   CL 107 (H) 04/12/2020 1534   CO2 21 04/12/2020 1534   GLUCOSE 92 04/12/2020 1534   GLUCOSE 87 07/04/2019 0810   BUN 15 04/12/2020 1534   CREATININE 0.76 04/12/2020 1534   CALCIUM 9.2 04/12/2020 1534   GFRNONAA 88 04/12/2020 1534   GFRAA 101 04/12/2020 1534    BNP    Component Value Date/Time   BNP 14.0 07/04/2019 0810    ProBNP No results found for: PROBNP  Imaging: MS DIGITAL SCREENING TOMO BILATERAL  Result Date: 11/11/2020 CLINICAL DATA:  Screening. EXAM: DIGITAL SCREENING BILATERAL MAMMOGRAM WITH TOMOSYNTHESIS AND CAD TECHNIQUE: Bilateral screening digital  craniocaudal and mediolateral oblique mammograms were obtained. Bilateral screening digital breast tomosynthesis was performed. The images were evaluated with computer-aided detection. COMPARISON:  Previous exam(s). ACR Breast Density Category b: There are scattered areas of fibroglandular density. FINDINGS: There are no findings suspicious for malignancy. IMPRESSION: No mammographic evidence of malignancy. A result letter of this screening mammogram will be mailed directly to the patient. RECOMMENDATION: Screening mammogram in one year. (Code:SM-B-01Y) BI-RADS CATEGORY  1: Negative. Electronically Signed   By: Lillia Mountain M.D.   On: 11/11/2020 10:00    Assessment & Plan:   COPD (chronic obstructive pulmonary disease) (HCC) - Frequent COPD exacerbations; most recent exacerbation was 2 weeks ago and she responded well to oral Doxycycline and prednisone taper. She is complaint with Advair 250/50 and Incruse. Recommend adding Daliresp 241mcg po daily x 1 months, if tolerates will increase to full dose 562mcg daily. FU in 4 weeks    Martyn Ehrich, NP 11/30/2020

## 2020-11-30 NOTE — Assessment & Plan Note (Addendum)
-   Frequent COPD exacerbations; most recent exacerbation was 2 weeks ago and she responded well to oral Doxycycline and prednisone taper. She is complaint with Advair 250/50 and Incruse. Recommend adding Daliresp to help decreased frequency of COPD exacerbations. Starting dose is daily, if tolerates will increased to full dose of 500mg . FU in 4 weeks.

## 2020-11-30 NOTE — Patient Instructions (Addendum)
Recommendations: - Continue Advair 1 puffs every 12 hours (rinse mouth after use) - Continue Incruse one puff daily in the morning - Start Daliresp daily x 28 days; we will then increase to daily if tolerating (this medication is used to prevent COPD flare ups by decreasing inflammation in the lungs)  Follow-up: - 1 month televisit with [name] NP - 3-4 months with either Dr. Jayme Cloud or Belia Heman (new patient- COPD)       Roflumilast oral tablets What is this medication? ROFLUMILAST (roe FLUE mi last) decreases inflammation in the lungs. This medicine is used to prevent COPD flare-ups. Do not use this medicine to treatsudden breathing problems. This medicine may be used for other purposes; ask your health care provider orpharmacist if you have questions. COMMON BRAND NAME(S): Daliresp What should I tell my care team before I take this medication? They need to know if you have any of these conditions: liver disease mental illness an unusual or allergic reaction to roflumilast, other medicines, foods, dyes, or preservatives pregnant or trying to get pregnant breast-feeding How should I use this medication? Take this medicine by mouth with a glass of water. Follow the directions on the prescription label. You can take it with or without food. If it upsets your stomach, take it with food. Take your medicine at regular intervals. Do not take it more often than directed. Do not stop taking except on your doctor'sadvice. A special MedGuide will be given to you by the pharmacist with eachprescription and refill. Be sure to read this information carefully each time. Talk to your pediatrician regarding the use of this medicine in children.Special care may be needed. Overdosage: If you think you have taken too much of this medicine contact apoison control center or emergency room at once. NOTE: This medicine is only for you. Do not share this medicine with others. What if I miss a  dose? If you miss a dose, take it as soon as you can. If it is almost time for yournext dose, take only that dose. Do not take double or extra doses. What may interact with this medication? carbamazepine cimetidine enoxacin erythromycin fluvoxamine ketoconazole phenobarbital phenytoin rifampicin St. John's wort This list may not describe all possible interactions. Give your health care provider a list of all the medicines, herbs, non-prescription drugs, or dietary supplements you use. Also tell them if you smoke, drink alcohol, or use illegaldrugs. Some items may interact with your medicine. What should I watch for while using this medication? Visit your doctor for regular check ups. Tell your doctor or healthcareprofessional if your symptoms do not start to get better or if they get worse. What side effects may I notice from receiving this medication? Side effects that you should report to your doctor or health care professionalas soon as possible: allergic reactions like skin rash, itching or hives, swelling of the face, lips, or tongue anxious breathing problems suicidal thoughts or other mood changes trouble sleeping weight loss Side effects that usually do not require medical attention (report to yourdoctor or health care professional if they continue or are bothersome): back pain diarrhea dizziness general ill feeling or flu-like symptoms headache loss of appetite nausea, vomiting This list may not describe all possible side effects. Call your doctor for medical advice about side effects. You may report side effects to FDA at1-800-FDA-1088. Where should I keep my medication? Keep out of the reach of children. Store at room temperature between 15 and 30 degrees C (59 and  86 degrees F).Throw away any unused medicine after the expiration date. NOTE: This sheet is a summary. It may not cover all possible information. If you have questions about this medicine, talk to your  doctor, pharmacist, orhealth care provider.  2022 Elsevier/Gold Standard (2015-05-26 09:35:23)

## 2020-12-01 ENCOUNTER — Telehealth: Payer: Self-pay | Admitting: Pharmacist

## 2020-12-01 NOTE — Telephone Encounter (Signed)
12/01/2020 11:04:05 AM - Xifaxan refill called to [facility]  -- Rhetta Mura - Thursday, December 01, 2020 11:02 AM --Alver Fisher [phone] spoke with Lattie Corns to place the refill, they will ship to our office, allow 3-5 business days for [place] to receive.

## 2020-12-02 ENCOUNTER — Telehealth: Payer: Self-pay | Admitting: Pharmacist

## 2020-12-02 NOTE — Telephone Encounter (Signed)
12/02/2020 3:12:02 PM - Advair & Incruse Ellipta refill called in  -- Rhetta Mura - Friday, December 02, 2020 3:10 PM --Called [facility] spoke with Rosey Bath to place refills on Advair 250/50 Rx#[id number] & Incruse Ellipta 62.5 FB#[id number]--MDYJW # [id number] allow 7-10 business for [place] to receive at our office.

## 2020-12-05 ENCOUNTER — Encounter: Payer: Self-pay | Admitting: *Deleted

## 2020-12-05 NOTE — Progress Notes (Signed)
Letter mailed from the [facility] to inform patient of her normal mammogram results.  Patient is to follow-up with annual screening in one year. 

## 2020-12-08 ENCOUNTER — Other Ambulatory Visit: Payer: Self-pay

## 2020-12-08 MED ORDER — XIFAXAN 550 MG PO TABS
550.0000 mg | ORAL_TABLET | Freq: Two times a day (BID) | ORAL | 3 refills | Status: DC
  Filled 2020-12-08: qty 180, 90d supply, fill #0

## 2020-12-09 ENCOUNTER — Other Ambulatory Visit: Payer: Self-pay

## 2020-12-13 ENCOUNTER — Other Ambulatory Visit: Payer: Self-pay

## 2020-12-13 MED FILL — Omeprazole Cap Delayed Release 20 MG: ORAL | 90 days supply | Qty: 90 | Fill #0 | Status: AC

## 2020-12-20 ENCOUNTER — Ambulatory Visit: Payer: Medicaid Other | Admitting: Specialist

## 2020-12-21 ENCOUNTER — Other Ambulatory Visit: Payer: Self-pay

## 2021-01-02 ENCOUNTER — Other Ambulatory Visit: Payer: Self-pay

## 2021-01-02 ENCOUNTER — Encounter: Payer: Medicaid Other | Admitting: Primary Care

## 2021-01-02 ENCOUNTER — Telehealth: Payer: Self-pay | Admitting: Primary Care

## 2021-01-02 NOTE — Progress Notes (Signed)
 Err

## 2021-01-02 NOTE — Telephone Encounter (Signed)
I called the patient to verify that patient has been taking Daliresp and the patient let me know that she has not started taking this medication at this time. She is going to call back and make a follow up after she has been on the medication for at least 3-4 weeks per Buelah Manis. FYI/

## 2021-01-02 NOTE — Progress Notes (Signed)
I called to start the visit and left a voicemail. I will try again later. See phone note and patient has not started medication.

## 2021-01-05 ENCOUNTER — Other Ambulatory Visit: Payer: Self-pay

## 2021-01-06 ENCOUNTER — Other Ambulatory Visit: Payer: Self-pay

## 2021-01-10 ENCOUNTER — Other Ambulatory Visit: Payer: Self-pay

## 2021-01-12 ENCOUNTER — Ambulatory Visit: Payer: Medicaid Other | Admitting: Gerontology

## 2021-01-26 ENCOUNTER — Other Ambulatory Visit: Payer: Self-pay

## 2021-01-26 ENCOUNTER — Ambulatory Visit: Payer: Medicaid Other | Admitting: Gerontology

## 2021-01-26 ENCOUNTER — Encounter: Payer: Self-pay | Admitting: Gerontology

## 2021-01-26 VITALS — BP 120/73 | HR 73 | Temp 97.9°F | Resp 18 | Ht 63.0 in | Wt 175.2 lb

## 2021-01-26 DIAGNOSIS — M25541 Pain in joints of right hand: Secondary | ICD-10-CM

## 2021-01-26 DIAGNOSIS — M25549 Pain in joints of unspecified hand: Secondary | ICD-10-CM | POA: Insufficient documentation

## 2021-01-26 DIAGNOSIS — M25562 Pain in left knee: Secondary | ICD-10-CM

## 2021-01-26 DIAGNOSIS — Z9109 Other allergy status, other than to drugs and biological substances: Secondary | ICD-10-CM

## 2021-01-26 DIAGNOSIS — G8929 Other chronic pain: Secondary | ICD-10-CM

## 2021-01-26 MED ORDER — CETIRIZINE HCL 10 MG PO TABS
10.0000 mg | ORAL_TABLET | Freq: Every day | ORAL | 2 refills | Status: DC
  Filled 2021-01-26: qty 30, 30d supply, fill #0
  Filled 2021-03-02: qty 30, 30d supply, fill #1
  Filled 2021-04-21: qty 30, 30d supply, fill #2

## 2021-01-26 MED ORDER — LIDOCAINE 5 % EX PTCH
1.0000 | MEDICATED_PATCH | CUTANEOUS | 0 refills | Status: DC
  Filled 2021-01-26: qty 30, 30d supply, fill #0

## 2021-01-26 NOTE — Progress Notes (Signed)
Established Patient Office Visit  Subjective:  Patient ID: Monique Edwards, female    DOB: Jan 10, 1966  Age: 55 y.o. MRN: [id number]  CC:  Chief Complaint  Patient presents with   Follow-up   Hypertension   COPD   Joint Pain    Patient requesting something for arthritis. She states her hands and knees ache.    HPI Monique Edwards  is a 55 y/o female who has history of COPD, Hypertension, GERD, Fatty Liver,presents for routine follow up visit. She states that her breathing is stable and uses her inhalers. She was seen at the Pulmonology clinic by Tommy Rainwater NP on 11/30/20 for chronic COPD flare and was prescribed Roflumilast which [facility] is ordering for her. She smokes 3 cigarettes weekly and continues to work on [employer].  She c/o worsening intermittent chronic pain to her left knee and finger joints. She takes 2 tabs of Aleve with minimal relief. She denies muscle and motor weakness. Overall, she states that she's doing well and offers no further complaint.  Past Medical History:  Diagnosis Date   COPD (chronic obstructive pulmonary disease) (HCC)    Fatty liver    GERD (gastroesophageal reflux disease)    Hypertension     Past Surgical History:  Procedure Laterality Date   CHOLECYSTECTOMY     ESOPHAGOGASTRODUODENOSCOPY (EGD) WITH PROPOFOL N/A 06/06/2018   Procedure: ESOPHAGOGASTRODUODENOSCOPY (EGD) WITH PROPOFOL;  Surgeon: Wyline Mood, MD;  Location: [facility] ENDOSCOPY;  Service: Gastroenterology;  Laterality: N/A;    Family History  Problem Relation Age of Onset   Hypertension Mother    Stroke Mother    Diabetes Father    Arthritis Sister    Thyroid disease Sister    Hypertension Brother    Hypertension Brother     Social History   Socioeconomic History   Marital status: Widowed    Spouse name: Not on file   Number of children: Not on file   Years of education: Not on file   Highest education level: Not on file  Occupational History   Occupation:  Unemployed  Tobacco Use   Smoking status: Some Days    Years: 16.00    Types: Cigarettes    Start date: 11/04/1980   Smokeless tobacco: Never   Tobacco comments:    5-6 cigarettes per week 11/30/2020  Vaping Use   Vaping Use: Never used  Substance and Sexual Activity   Alcohol use: Yes    Comment: 2-3 beers every few weeks   Drug use: Not Currently    Types: Cocaine    Comment: last use 2021   Sexual activity: Yes    Partners: Male  Other Topics Concern   Not on file  Social History Narrative   Not on file   Social Determinants of Health   Financial Resource Strain: Not on file  Food Insecurity: No Food Insecurity   Worried About Running Out of Food in the Last Year: Never true   Ran Out of Food in the Last Year: Never true  Transportation Needs: No Transportation Needs   Lack of Transportation (Medical): No   Lack of Transportation (Non-Medical): No  Physical Activity: Not on file  Stress: Not on file  Social Connections: Not on file  Intimate Partner Violence: Not on file    Outpatient Medications Prior to Visit  Medication Sig Dispense Refill   albuterol (PROAIR HFA) 108 (90 Base) MCG/ACT inhaler Inhale 2 puffs into the lungs every 6 (six) hours as needed for wheezing  or shortness of breath. 34 g 3   Blood Pressure Monitor KIT 1 kit by Does not apply route daily. 1 kit 0   fluticasone (FLONASE) 50 MCG/ACT nasal spray Spray 1 spray into both nostrils once daily. 16 g 2   fluticasone-salmeterol (ADVAIR) 250-50 MCG/ACT AEPB Inhale 1 puff into the lungs in the morning and at bedtime. 180 each 2   lactulose (CHRONULAC) 10 GM/15ML solution TAKE 15MLS BY MOUTH 3 TIMES A DAY 4257 mL 1   losartan (COZAAR) 25 MG tablet TAKE ONE TABLET BY MOUTH EVERY DAY 90 tablet 1   omeprazole (PRILOSEC) 20 MG capsule Take 1 capsule ($RemoveBe'20mg'pofFihbpz$ ) by mouth once every day. 90 capsule 1   umeclidinium bromide (INCRUSE ELLIPTA) 62.5 MCG/INH AEPB INHALE ONE PUFF INTO THE LUNGS ONCE DAILY. 30 each 3    XIFAXAN 550 MG TABS tablet Take 1 tablet (550 mg total) by mouth 2 (two) times daily. 180 tablet 3   cetirizine (ZYRTEC) 10 MG tablet Take 1 tablet (10 mg total) by mouth once daily. 30 tablet 2   ibuprofen (ADVIL) 200 MG tablet Take 800 mg by mouth every 6 (six) hours as needed.     polyethylene glycol (GOLYTELY) 236 g solution Drink 8 oz every 20-30 minutes until entire prep is finished 4000 mL 0   polyethylene glycol-electrolytes (NULYTELY) 420 g solution Prepare according to package instructions. Starting at 5:00 PM: Drink one 8 oz glass of mixture every 15 minutes until you finish half of the jug. Five hours prior to procedure, drink 8 oz glass of mixture every 15 minutes until it is all gone. Make sure you do not drink anything 4 hours prior to your procedure. 4000 mL 0   Roflumilast (DALIRESP) 250 MCG TABS Take 1 tablet by mouth once daily. 28 tablet 0   doxycycline (VIBRA-TABS) 100 MG tablet Take 1 tablet (100 mg total) by mouth 2 (two) times daily. 14 tablet 0   predniSONE (DELTASONE) 10 MG tablet TAKE 4 TABLETS ($RemoveBe'40MG'neldEixvn$  TOTAL) BY MOUTH ONCE DAILY FOR 2 DAYS. THEN TAKE 2 TABLETS ($RemoveBe'20MG'CMFGpncUu$ ) ONCE DAILY FOR 2 DAYS. THEN TAKE 1 TABLET ($RemoveB'10MG'uOMtCawl$ ) BY MOUTH ONCE DAILY FOR 2 DAYS. THEN STOP. 14 tablet 0   No facility-administered medications prior to visit.    Allergies  Allergen Reactions   Amlodipine Swelling    Peripheral edema   Lisinopril Cough    ROS Review of Systems  Constitutional: Negative.   Respiratory: Negative.    Cardiovascular: Negative.   Musculoskeletal:  Positive for arthralgias (chronic knee and joint pain).  Hematological: Negative.      Objective:    Physical Exam HENT:     Head: Normocephalic and atraumatic.  Cardiovascular:     Rate and Rhythm: Normal rate and regular rhythm.     Pulses: Normal pulses.     Heart sounds: Normal heart sounds.  Pulmonary:     Effort: Pulmonary effort is normal.     Breath sounds: Normal breath sounds.  Musculoskeletal:         General: Normal range of motion.  Neurological:     General: No focal deficit present.     Mental Status: She is alert and oriented to person, place, and time. Mental status is at baseline.    BP 120/73 (BP Location: Right Arm, Patient Position: Sitting, Cuff Size: Normal)   Pulse 73   Temp 97.9 F (36.6 C)   Resp 18   Ht $R'5\' 3"'pe$  (1.6 m)   Wt 175 lb 3.2  oz (79.5 kg)   LMP 02/28/2014 (Approximate)   SpO2 96%   BMI 31.04 kg/m  Wt Readings from Last 3 Encounters:  01/26/21 175 lb 3.2 oz (79.5 kg)  11/30/20 184 lb 6.4 oz (83.6 kg)  11/09/20 179 lb 14.4 oz (81.6 kg)   She was encouraged to continue on her weight loss regimen.  Health Maintenance Due  Topic Date Due   COVID-19 Vaccine (1) Never done   HIV Screening  Never done   TETANUS/TDAP  Never done   Zoster Vaccines- Shingrix (1 of 2) Never done   COLONOSCOPY (Pts 45-46yrs Insurance coverage will need to be confirmed)  Never done   INFLUENZA VACCINE  12/05/2020   PAP SMEAR-Modifier  01/29/2021    There are no preventive care reminders to display for this patient.  Lab Results  Component Value Date   TSH 2.130 03/19/2018   Lab Results  Component Value Date   WBC 3.5 04/12/2020   HGB 12.1 04/12/2020   HCT 34.8 04/12/2020   MCV 90 04/12/2020   PLT 75 (LL) 04/12/2020   Lab Results  Component Value Date   NA 141 04/12/2020   K 3.9 04/12/2020   CO2 21 04/12/2020   GLUCOSE 92 04/12/2020   BUN 15 04/12/2020   CREATININE 0.76 04/12/2020   BILITOT 0.3 04/12/2020   ALKPHOS 55 04/12/2020   AST 21 04/12/2020   ALT 17 04/12/2020   PROT 6.7 04/12/2020   ALBUMIN 4.1 04/12/2020   CALCIUM 9.2 04/12/2020   ANIONGAP 13 07/04/2019   Lab Results  Component Value Date   CHOL 168 03/11/2019   Lab Results  Component Value Date   HDL 76 03/11/2019   Lab Results  Component Value Date   LDLCALC 77 03/11/2019   Lab Results  Component Value Date   TRIG 81 03/11/2019   Lab Results  Component Value Date   CHOLHDL 2.2  03/11/2019   Lab Results  Component Value Date   HGBA1C 5.0 03/11/2019      Assessment & Plan:   1. Chronic pain of left knee - She was advised to apply Lidocaine patch and take 400 mg Aleve as needed every 12 hours for pain. She will follow up with [facility] Dr Jefm Bryant. - lidocaine (LIDODERM) 5 %; Place 1 patch onto the skin daily. Remove & Discard patch within 12 hours or as directed by MD  Dispense: 30 patch; Refill: 0  2. Pain involving joints of fingers of both hands -She will continue on 400 mg Aleve every 12 hours as needed and will follow up with Dr Jefm Bryant at [facility].   3. Allergy to environmental factors - She will continue on current medication regimen and avoid environmental allergens. - cetirizine (ZYRTEC) 10 MG tablet; Take 1 tablet (10 mg total) by mouth once daily.  Dispense: 30 tablet; Refill: 2      Follow-up: Return in about 4 weeks (around 02/23/2021), or if symptoms worsen or fail to improve.    Alexia Dinger Jerold Coombe, NP

## 2021-01-27 ENCOUNTER — Other Ambulatory Visit: Payer: Self-pay

## 2021-02-07 ENCOUNTER — Other Ambulatory Visit: Payer: Self-pay | Admitting: Gerontology

## 2021-02-07 ENCOUNTER — Other Ambulatory Visit: Payer: Self-pay

## 2021-02-07 DIAGNOSIS — Z8709 Personal history of other diseases of the respiratory system: Secondary | ICD-10-CM

## 2021-02-07 MED ORDER — INCRUSE ELLIPTA 62.5 MCG/INH IN AEPB
INHALATION_SPRAY | RESPIRATORY_TRACT | 2 refills | Status: DC
  Filled 2021-02-07: qty 30, fill #0
  Filled 2021-02-09: qty 60, 60d supply, fill #0

## 2021-02-08 ENCOUNTER — Telehealth: Payer: Self-pay | Admitting: Pharmacist

## 2021-02-08 NOTE — Telephone Encounter (Signed)
--   Rhetta Mura - Wednesday, February 08, 2021 10:49 AM --Patient in office yesterday 02/07/21 reprinted forms for patient to sign, (previously mailed to patient 12/05/20--not returned). The provider Ames Dura, NP @ [facility] had previously signed forms & scripts 12/13/20--now due to time and [place] has changed their application-I have reprinted and sending [facility] to [facility] office-(pt saw provider there) for provider to sign and return.

## 2021-02-08 NOTE — Telephone Encounter (Signed)
--   Rhetta Mura - Wednesday, February 08, 2021 9:17 AM --Refilled Phillip Heal & Advair 250/50 online with [facility], order# [id number].

## 2021-02-09 ENCOUNTER — Other Ambulatory Visit: Payer: Self-pay

## 2021-02-14 ENCOUNTER — Other Ambulatory Visit: Payer: Self-pay | Admitting: Gerontology

## 2021-02-14 ENCOUNTER — Other Ambulatory Visit: Payer: Self-pay

## 2021-02-14 DIAGNOSIS — Z8709 Personal history of other diseases of the respiratory system: Secondary | ICD-10-CM

## 2021-02-14 MED FILL — Fluticasone-Salmeterol Aer Powder BA 250-50 MCG/ACT: RESPIRATORY_TRACT | Qty: 180 | Fill #0 | Status: CN

## 2021-02-15 ENCOUNTER — Other Ambulatory Visit: Payer: Self-pay

## 2021-02-15 ENCOUNTER — Telehealth: Payer: Self-pay | Admitting: Pharmacist

## 2021-02-15 NOTE — Telephone Encounter (Signed)
--   Rhetta Mura - Wednesday, February 15, 2021 11:05 AM --Alver Fisher for refill on Xifaxan, allow 3-7 business days for [place] to receive at our office.

## 2021-02-16 ENCOUNTER — Other Ambulatory Visit: Payer: Self-pay

## 2021-02-22 ENCOUNTER — Other Ambulatory Visit: Payer: Self-pay

## 2021-02-22 ENCOUNTER — Other Ambulatory Visit: Payer: Self-pay | Admitting: Gerontology

## 2021-02-22 DIAGNOSIS — Z8709 Personal history of other diseases of the respiratory system: Secondary | ICD-10-CM

## 2021-02-22 MED FILL — Umeclidinium Br Aero Powd Breath Act 62.5 MCG/ACT (Base Eq): RESPIRATORY_TRACT | Qty: 30 | Fill #0 | Status: CN

## 2021-02-23 ENCOUNTER — Other Ambulatory Visit: Payer: Self-pay

## 2021-02-23 ENCOUNTER — Ambulatory Visit: Payer: Medicaid Other | Admitting: Gerontology

## 2021-02-24 ENCOUNTER — Other Ambulatory Visit: Payer: Self-pay

## 2021-03-01 ENCOUNTER — Other Ambulatory Visit: Payer: Self-pay

## 2021-03-02 ENCOUNTER — Other Ambulatory Visit: Payer: Medicaid Other

## 2021-03-02 ENCOUNTER — Other Ambulatory Visit: Payer: Self-pay

## 2021-03-02 MED FILL — Omeprazole Cap Delayed Release 20 MG: ORAL | 30 days supply | Qty: 30 | Fill #1 | Status: AC

## 2021-03-02 MED FILL — Umeclidinium Br Aero Powd Breath Act 62.5 MCG/ACT (Base Eq): RESPIRATORY_TRACT | Qty: 30 | Fill #0 | Status: CN

## 2021-03-08 ENCOUNTER — Ambulatory Visit: Payer: Medicaid Other | Admitting: Gerontology

## 2021-03-08 ENCOUNTER — Encounter: Payer: Self-pay | Admitting: Gerontology

## 2021-03-08 ENCOUNTER — Other Ambulatory Visit: Payer: Self-pay

## 2021-03-08 VITALS — BP 120/77 | HR 56 | Temp 98.1°F | Ht 63.0 in | Wt 181.5 lb

## 2021-03-08 DIAGNOSIS — R631 Polydipsia: Secondary | ICD-10-CM

## 2021-03-08 DIAGNOSIS — G8929 Other chronic pain: Secondary | ICD-10-CM

## 2021-03-08 DIAGNOSIS — J441 Chronic obstructive pulmonary disease with (acute) exacerbation: Secondary | ICD-10-CM

## 2021-03-08 DIAGNOSIS — Z0000 Encounter for general adult medical examination without abnormal findings: Secondary | ICD-10-CM

## 2021-03-08 MED ORDER — FLUTICASONE PROPIONATE 50 MCG/ACT NA SUSP
1.0000 | Freq: Every day | NASAL | 2 refills | Status: AC
  Filled 2021-03-08: qty 16, 60d supply, fill #0
  Filled 2021-04-21 – 2021-05-09 (×2): qty 16, 60d supply, fill #1
  Filled 2021-07-14: qty 16, 60d supply, fill #2

## 2021-03-08 NOTE — Patient Instructions (Addendum)
Please call Pulmonology for follow up: [phone]    Low-Sodium Eating Plan Sodium, which is an element that makes up salt, helps you maintain a healthy balance of fluids in your body. Too much sodium can increase your blood pressure and cause fluid and waste to be held in your body. Your health care provider or dietitian may recommend following this plan if you have high blood pressure (hypertension), kidney disease, liver disease, or heart failure. Eating less sodium can help lower your blood pressure, reduce swelling, and protect your heart, liver, and kidneys. What are tips for following this plan? Reading food labels The Nutrition Facts label lists the amount of sodium in one serving of the food. If you eat more than one serving, you must multiply the listed amount of sodium by the number of servings. Choose foods with less than 140 mg of sodium per serving. Avoid foods with 300 mg of sodium or more per serving. Shopping  Look for lower-sodium products, often labeled as "low-sodium" or "no salt added." Always check the sodium content, even if foods are labeled as "unsalted" or "no salt added." Buy fresh foods. Avoid canned foods and pre-made or frozen meals. Avoid canned, cured, or processed meats. Buy breads that have less than 80 mg of sodium per slice. Cooking  Eat more home-cooked food and less restaurant, buffet, and fast food. Avoid adding salt when cooking. Use salt-free seasonings or herbs instead of table salt or sea salt. Check with your health care provider or pharmacist before using salt substitutes. Cook with plant-based oils, such as canola, sunflower, or olive oil. Meal planning When eating at a restaurant, ask that your food be prepared with less salt or no salt, if possible. Avoid dishes labeled as brined, pickled, cured, smoked, or made with soy sauce, miso, or teriyaki sauce. Avoid foods that contain MSG (monosodium glutamate). MSG is sometimes added to [place]  food, bouillon, and some canned foods. Make meals that can be grilled, baked, poached, roasted, or steamed. These are generally made with less sodium. General information Most people on this plan should limit their sodium intake to 1,500-2,000 mg (milligrams) of sodium each day. What foods should I eat? Fruits Fresh, frozen, or canned fruit. Fruit juice. Vegetables Fresh or frozen vegetables. "No salt added" canned vegetables. "No salt added" tomato sauce and paste. Low-sodium or reduced-sodium tomato and vegetable juice. Grains Low-sodium cereals, including oats, puffed wheat and rice, and shredded wheat. Low-sodium crackers. Unsalted rice. Unsalted pasta. Low-sodium bread. Whole-grain breads and whole-grain pasta. Meats and other proteins Fresh or frozen (no salt added) meat, poultry, seafood, and fish. Low-sodium canned tuna and salmon. Unsalted nuts. Dried peas, beans, and lentils without added salt. Unsalted canned beans. Eggs. Unsalted nut butters. Dairy Milk. Soy milk. Cheese that is naturally low in sodium, such as ricotta cheese, fresh mozzarella, or Swiss cheese. Low-sodium or reduced-sodium cheese. Cream cheese. Yogurt. Seasonings and condiments Fresh and dried herbs and spices. Salt-free seasonings. Low-sodium mustard and ketchup. Sodium-free salad dressing. Sodium-free light mayonnaise. Fresh or refrigerated horseradish. Lemon juice. Vinegar. Other foods Homemade, reduced-sodium, or low-sodium soups. Unsalted popcorn and pretzels. Low-salt or salt-free chips. The items listed above may not be a complete list of foods and beverages you can eat. Contact a dietitian for more information. What foods should I avoid? Vegetables Sauerkraut, pickled vegetables, and relishes. Olives. [place] fries. Onion rings. Regular canned vegetables (not low-sodium or reduced-sodium). Regular canned tomato sauce and paste (not low-sodium or reduced-sodium). Regular tomato and vegetable juice (  not  low-sodium or reduced-sodium). Frozen vegetables in sauces. Grains Instant hot cereals. Bread stuffing, pancake, and biscuit mixes. Croutons. Seasoned rice or pasta mixes. Noodle soup cups. Boxed or frozen macaroni and cheese. Regular salted crackers. Self-rising flour. Meats and other proteins Meat or fish that is salted, canned, smoked, spiced, or pickled. Precooked or cured meat, such as sausages or meat loaves. Tomasa Blase. Ham. Pepperoni. Hot dogs. Corned beef. Chipped beef. Salt pork. Jerky. Pickled herring. Anchovies and sardines. Regular canned tuna. Salted nuts. Dairy Processed cheese and cheese spreads. Hard cheeses. Cheese curds. Blue cheese. Feta cheese. String cheese. Regular cottage cheese. Buttermilk. Canned milk. Fats and oils Salted butter. Regular margarine. Ghee. Bacon fat. Seasonings and condiments Onion salt, garlic salt, seasoned salt, table salt, and sea salt. Canned and packaged gravies. Worcestershire sauce. Tartar sauce. Barbecue sauce. Teriyaki sauce. Soy sauce, including reduced-sodium. Steak sauce. Fish sauce. Oyster sauce. Cocktail sauce. Horseradish that you find on the shelf. Regular ketchup and mustard. Meat flavorings and tenderizers. Bouillon cubes. Hot sauce. Pre-made or packaged marinades. Pre-made or packaged taco seasonings. Relishes. Regular salad dressings. Salsa. Other foods Salted popcorn and pretzels. Corn chips and puffs. Potato and tortilla chips. Canned or dried soups. Pizza. Frozen entrees and pot pies. The items listed above may not be a complete list of foods and beverages you should avoid. Contact a dietitian for more information. Summary Eating less sodium can help lower your blood pressure, reduce swelling, and protect your heart, liver, and kidneys. Most people on this plan should limit their sodium intake to 1,500-2,000 mg (milligrams) of sodium each day. Canned, boxed, and frozen foods are high in sodium. Restaurant foods, fast foods, and pizza are  also very high in sodium. You also get sodium by adding salt to food. Try to cook at home, eat more fresh fruits and vegetables, and eat less fast food and canned, processed, or prepared foods. This information is not intended to replace advice given to you by your health care provider. Make sure you discuss any questions you have with your health care provider. Document Revised: 05/29/2019 Document Reviewed: 03/25/2019 Elsevier Patient Education  2022 [employer].

## 2021-03-08 NOTE — Progress Notes (Signed)
Established Patient Office Visit  Subjective:  Patient ID: Monique Edwards, female    DOB: 1966-03-14  Age: 55 y.o. MRN: [id number]  CC:  Chief Complaint  Patient presents with  . Follow-up    Follow-up. Pt reports knee pain is increasing with use of knee pads. Pt also reports hand cramps and swelling. Pt states she has been more thirsty than usual lately.  Marland Kitchen COPD    Pt says her COPD in getting worse. Wants to contact her pulmonologist at [facility] but doesn't have his number   HPI Monique Edwards is a 55 yo female with a history of COPD, hypertension, GERD, and fatty liver disease who presents for follow up. She states that her knee pain is somewhat better with the lidocaine patches, but still gives her issues. She has been using aleve twice daily to help with the residual pain and occasional swelling. She also mentions bilateral hand and wrist pain with some occasional swelling. She describes it as aching, 5/10, and has noticed diminished strength in both hands equally. She states her hands feel swollen in the morning, but resolves during the day. She reports feeling an increased thirst for the past few weeks. She drinks 3-4 diet sodas and 3-4 waters a day. She drinks a bottle of water first thing in the morning and thirst has woken her up in the middle of the night at least 3 times in the last week. Her normal meals have included ramen noodles, crackers, canned vegetables, canned tuna with mayo and relish, hamburger helper, and frozen TV dinners. She has been trying to watch her weight and does eat a lot of salads, too.  She feels her shortness of breath is worse the last week or so, has been using her rescue inhaler twice daily. It does not wake her up at night, and she will schedule an appointment with the Pulmonologist. She has not been able to fill the daliresp yet, but went to [facility] to sign the paperwork for it. She occasionally forgets to use her advair inhaler at night. She does have a  little bit of nasal congestion and post nasal drip. She frequently clears her throat. She would like a refill of her flonase.  She offers no other complaints and feels she is doing well in general.   Past Medical History:  Diagnosis Date  . COPD (chronic obstructive pulmonary disease) (HCC)   . Fatty liver   . GERD (gastroesophageal reflux disease)   . Hypertension     Past Surgical History:  Procedure Laterality Date  . CHOLECYSTECTOMY    . ESOPHAGOGASTRODUODENOSCOPY (EGD) WITH PROPOFOL N/A 06/06/2018   Procedure: ESOPHAGOGASTRODUODENOSCOPY (EGD) WITH PROPOFOL;  Surgeon: Wyline Mood, MD;  Location: [facility] ENDOSCOPY;  Service: Gastroenterology;  Laterality: N/A;    Family History  Problem Relation Age of Onset  . Hypertension Mother   . Stroke Mother   . Diabetes Father   . Arthritis Sister   . Thyroid disease Sister   . Hypertension Brother   . Hypertension Brother     Social History   Socioeconomic History  . Marital status: Widowed    Spouse name: Not on file  . Number of children: Not on file  . Years of education: Not on file  . Highest education level: Not on file  Occupational History  . Occupation: Unemployed  Tobacco Use  . Smoking status: Some Days    Years: 16.00    Types: Cigarettes    Start date: 11/04/1980  .  Smokeless tobacco: Never  . Tobacco comments:    5-6 cigarettes per week 11/30/2020  Vaping Use  . Vaping Use: Never used  Substance and Sexual Activity  . Alcohol use: Yes    Comment: 2-3 beers every few weeks  . Drug use: Not Currently    Types: Cocaine    Comment: last use 2021  . Sexual activity: Yes    Partners: Male  Other Topics Concern  . Not on file  Social History Narrative  . Not on file   Social Determinants of Health   Financial Resource Strain: Not on file  Food Insecurity: No Food Insecurity  . Worried About Charity fundraiser in the Last Year: Never true  . Ran Out of Food in the Last Year: Never true  Transportation  Needs: No Transportation Needs  . Lack of Transportation (Medical): No  . Lack of Transportation (Non-Medical): No  Physical Activity: Not on file  Stress: Not on file  Social Connections: Not on file  Intimate Partner Violence: Not on file    Outpatient Medications Prior to Visit  Medication Sig Dispense Refill  . albuterol (PROAIR HFA) 108 (90 Base) MCG/ACT inhaler Inhale 2 puffs into the lungs every 6 (six) hours as needed for wheezing or shortness of breath. 34 g 3  . cetirizine (ZYRTEC) 10 MG tablet Take 1 tablet (10 mg total) by mouth once daily. 30 tablet 2  . fluticasone-salmeterol (ADVAIR DISKUS) 250-50 MCG/ACT AEPB Inhale 1 puff into the lungs in the morning and at bedtime. 180 each 2  . lactulose (CHRONULAC) 10 GM/15ML solution TAKE 15MLS BY MOUTH 3 TIMES A DAY 4257 mL 1  . lidocaine (LIDODERM) 5 % Place 1 patch onto the skin topically once daily. Remove and discard patch within 12 hours or as directed by doctor. 30 patch 0  . losartan (COZAAR) 25 MG tablet TAKE ONE TABLET BY MOUTH EVERY DAY 90 tablet 1  . omeprazole (PRILOSEC) 20 MG capsule Take 1 capsule ($RemoveBe'20mg'FPiEPfBZv$ ) by mouth once every day. 90 capsule 1  . umeclidinium bromide (INCRUSE ELLIPTA) 62.5 MCG/ACT AEPB INHALE ONE PUFF INTO THE LUNGS ONCE DAILY. 30 each 2  . Blood Pressure Monitor KIT 1 kit by Does not apply route daily. 1 kit 0  . polyethylene glycol (GOLYTELY) 236 g solution Drink 8 oz every 20-30 minutes until entire prep is finished 4000 mL 0  . polyethylene glycol-electrolytes (NULYTELY) 420 g solution Prepare according to package instructions. Starting at 5:00 PM: Drink one 8 oz glass of mixture every 15 minutes until you finish half of the jug. Five hours prior to procedure, drink 8 oz glass of mixture every 15 minutes until it is all gone. Make sure you do not drink anything 4 hours prior to your procedure. 4000 mL 0  . Roflumilast (DALIRESP) 250 MCG TABS Take 1 tablet by mouth once daily. (Patient not taking: Reported  on 03/08/2021) 28 tablet 0  . XIFAXAN 550 MG TABS tablet Take 1 tablet (550 mg total) by mouth 2 (two) times daily. (Patient not taking: Reported on 03/08/2021) 180 tablet 3  . fluticasone (FLONASE) 50 MCG/ACT nasal spray Spray 1 spray into both nostrils once daily. (Patient not taking: Reported on 03/08/2021) 16 g 2   No facility-administered medications prior to visit.    Allergies  Allergen Reactions  . Amlodipine Swelling    Peripheral edema  . Lisinopril Cough    ROS Review of Systems  Constitutional:  Negative for activity change, appetite change  and fever.  HENT:  Positive for congestion, postnasal drip, rhinorrhea and sneezing. Negative for sinus pressure and sinus pain.   Eyes:  Negative for itching.  Respiratory:  Positive for shortness of breath.   Cardiovascular:  Negative for chest pain, palpitations and leg swelling.  Gastrointestinal:  Negative for abdominal distention, abdominal pain and diarrhea.  Endocrine: Positive for polydipsia.  Genitourinary:  Positive for frequency. Negative for difficulty urinating, dysuria and flank pain.  Musculoskeletal:  Positive for arthralgias.  Skin:  Negative for color change.  Allergic/Immunologic: Positive for environmental allergies.  Neurological:  Negative for dizziness, light-headedness and headaches.  Hematological: Negative.   Psychiatric/Behavioral: Negative.       Objective:    Physical Exam Vitals and nursing note reviewed.  Constitutional:      Appearance: Normal appearance. She is normal weight.  HENT:     Head: Normocephalic.     Nose: Nose normal.     Mouth/Throat:     Mouth: Mucous membranes are moist.  Eyes:     General: No scleral icterus.    Conjunctiva/sclera: Conjunctivae normal.  Cardiovascular:     Rate and Rhythm: Normal rate and regular rhythm.     Pulses: Normal pulses.  Pulmonary:     Effort: Pulmonary effort is normal. No tachypnea, accessory muscle usage or respiratory distress.     Breath  sounds: Decreased air movement present. No wheezing.  Abdominal:     General: Bowel sounds are normal.     Palpations: Abdomen is soft.  Genitourinary:    Comments: deferred Musculoskeletal:     Right wrist: No swelling. Normal range of motion.     Left wrist: No swelling. Normal range of motion.     Right hand: No swelling. Normal range of motion. Normal strength.     Left hand: No swelling. Normal range of motion. Normal strength.     Cervical back: Normal range of motion.     Right lower leg: No edema.     Left lower leg: No edema.  Skin:    General: Skin is warm and dry.     Capillary Refill: Capillary refill takes less than 2 seconds.  Neurological:     General: No focal deficit present.     Mental Status: She is alert and oriented to person, place, and time.  Psychiatric:        Mood and Affect: Mood normal.        Behavior: Behavior normal.   BP 120/77 (BP Location: Right Arm, Patient Position: Sitting, Cuff Size: Normal)   Pulse (!) 56   Temp 98.1 F (36.7 C)   Ht 5\' 3"  (1.6 m)   Wt 181 lb 8 oz (82.3 kg)   LMP 02/28/2014 (Approximate)   SpO2 98%   BMI 32.15 kg/m  Wt Readings from Last 3 Encounters:  03/08/21 181 lb 8 oz (82.3 kg)  01/26/21 175 lb 3.2 oz (79.5 kg)  11/30/20 184 lb 6.4 oz (83.6 kg)     Health Maintenance Due  Topic Date Due  . COVID-19 Vaccine (1) Never done  . HIV Screening  Never done  . TETANUS/TDAP  Never done  . Zoster Vaccines- Shingrix (1 of 2) Never done  . COLONOSCOPY (Pts 45-46yrs Insurance coverage will need to be confirmed)  Never done  . PAP SMEAR-Modifier  01/29/2021    There are no preventive care reminders to display for this patient.  Lab Results  Component Value Date   TSH 2.130 03/19/2018  Lab Results  Component Value Date   WBC 3.5 04/12/2020   HGB 12.1 04/12/2020   HCT 34.8 04/12/2020   MCV 90 04/12/2020   PLT 75 (LL) 04/12/2020   Lab Results  Component Value Date   NA 141 04/12/2020   K 3.9 04/12/2020    CO2 21 04/12/2020   GLUCOSE 92 04/12/2020   BUN 15 04/12/2020   CREATININE 0.76 04/12/2020   BILITOT 0.3 04/12/2020   ALKPHOS 55 04/12/2020   AST 21 04/12/2020   ALT 17 04/12/2020   PROT 6.7 04/12/2020   ALBUMIN 4.1 04/12/2020   CALCIUM 9.2 04/12/2020   ANIONGAP 13 07/04/2019   Lab Results  Component Value Date   CHOL 168 03/11/2019   Lab Results  Component Value Date   HDL 76 03/11/2019   Lab Results  Component Value Date   LDLCALC 77 03/11/2019   Lab Results  Component Value Date   TRIG 81 03/11/2019   Lab Results  Component Value Date   CHOLHDL 2.2 03/11/2019   Lab Results  Component Value Date   HGBA1C 5.0 03/11/2019      Assessment & Plan:   1. Chronic pain of left knee Continue the lidocaine patches and aleve per directions on the bottle as needed.  2. Polydipsia - Advised on hidden sodium sources in her diet. Gave low-sodium diet education and encouraged her to decrease diet soda intake, drinking more water.  - HgB A1c; Future - CBC; Future - Comp Met (CMET); Future - TSH; Future - Urinalysis, Routine w reflex microscopic; Future - TSH - Comp Met (CMET) - CBC - HgB A1c  3. Routine adult health maintenance - Will collect routine labs today. - HgB A1c; Future - CBC; Future - Comp Met (CMET); Future - TSH; Future - Urinalysis, Routine w reflex microscopic; Future - Lipid Profile; Future - Lipid Profile - TSH - Comp Met (CMET) - CBC - HgB A1c  4. COPD exacerbation (HCC) - Will refill flonase and encouraged her to use this daily with her zyrtec. Advised her to go to the ED if her shortness of breath worsens or isn't improved by her rescue inhaler. Encouraged her to follow up with [facility]ication Management regarding daliresp prescription. Gave her pulmonologist's phone number for her to schedule follow up. She will call them today.  - fluticasone (FLONASE) 50 MCG/ACT nasal spray; Spray 1 spray into both nostrils once daily.  Dispense: 16 g;  Refill: 2   Follow-up: Return in about 3 weeks (around 03/29/2021).    Harvin Hazel, RN

## 2021-03-09 ENCOUNTER — Other Ambulatory Visit: Payer: Self-pay

## 2021-03-09 ENCOUNTER — Telehealth: Payer: Self-pay | Admitting: Primary Care

## 2021-03-09 LAB — CBC
Hematocrit: 39.8 % (ref 34.0–46.6)
Hemoglobin: 13.3 g/dL (ref 11.1–15.9)
MCH: 30.6 pg (ref 26.6–33.0)
MCHC: 33.4 g/dL (ref 31.5–35.7)
MCV: 92 fL (ref 79–97)
Platelets: 81 10*3/uL — CL (ref 150–450)
RBC: 4.35 x10E6/uL (ref 3.77–5.28)
RDW: 12.4 % (ref 11.7–15.4)
WBC: 3.9 10*3/uL (ref 3.4–10.8)

## 2021-03-09 LAB — TSH: TSH: 1.74 u[IU]/mL (ref 0.450–4.500)

## 2021-03-09 LAB — COMPREHENSIVE METABOLIC PANEL
ALT: 17 IU/L (ref 0–32)
AST: 21 IU/L (ref 0–40)
Albumin/Globulin Ratio: 1.7 (ref 1.2–2.2)
Albumin: 4.5 g/dL (ref 3.8–4.9)
Alkaline Phosphatase: 63 IU/L (ref 44–121)
BUN/Creatinine Ratio: 15 (ref 9–23)
BUN: 16 mg/dL (ref 6–24)
Bilirubin Total: 0.2 mg/dL (ref 0.0–1.2)
CO2: 21 mmol/L (ref 20–29)
Calcium: 9.1 mg/dL (ref 8.7–10.2)
Chloride: 104 mmol/L (ref 96–106)
Creatinine, Ser: 1.05 mg/dL — ABNORMAL HIGH (ref 0.57–1.00)
Globulin, Total: 2.6 g/dL (ref 1.5–4.5)
Glucose: 84 mg/dL (ref 70–99)
Potassium: 4.3 mmol/L (ref 3.5–5.2)
Sodium: 141 mmol/L (ref 134–144)
Total Protein: 7.1 g/dL (ref 6.0–8.5)
eGFR: 62 mL/min/{1.73_m2} (ref >59)

## 2021-03-09 LAB — LIPID PANEL
Chol/HDL Ratio: 2.2 ratio (ref 0.0–4.4)
Cholesterol, Total: 166 mg/dL (ref 100–199)
HDL: 77 mg/dL (ref >39)
LDL Chol Calc (NIH): 73 mg/dL (ref 0–99)
Triglycerides: 86 mg/dL (ref 0–149)
VLDL Cholesterol Cal: 16 mg/dL (ref 5–40)

## 2021-03-09 LAB — HEMOGLOBIN A1C
Est. average glucose Bld gHb Est-mCnc: 108 mg/dL
Hgb A1c MFr Bld: 5.4 % (ref 4.8–5.6)

## 2021-03-09 NOTE — Telephone Encounter (Signed)
Recommend fluids.  Over-the-counter can get some Flonase 1 spray to each nostril twice a day.  If symptoms persist she will need to be seen at urgent care or ED given that she has significant liver disease and issues with platelets and also issues with kidney dysfunction.  Does not have fever so at present no antibiotics are indicated.

## 2021-03-09 NOTE — Telephone Encounter (Signed)
Spoke to patient.  Patient reports of dry cough, stuffy nose and increased sob with exertion x3-4d.  Denies f/c/s or additional sx. No supplemental oxygen. Does not monitor spo2.  She is taking incruse once daily, advair BID and proair BID No recent covid test. Fully vaccinated against covid and flu.  Dr. Jayme Cloud, please advise. Waynetta Sandy is unavailable.

## 2021-03-09 NOTE — Telephone Encounter (Signed)
Patient is aware of recommendations and voiced her understanding.  Nothing further needed at this time.  

## 2021-03-10 ENCOUNTER — Ambulatory Visit: Payer: Medicaid Other | Admitting: Primary Care

## 2021-03-14 ENCOUNTER — Other Ambulatory Visit: Payer: Self-pay

## 2021-03-16 ENCOUNTER — Other Ambulatory Visit: Payer: Medicaid Other

## 2021-03-20 ENCOUNTER — Other Ambulatory Visit: Payer: Self-pay

## 2021-03-22 ENCOUNTER — Other Ambulatory Visit: Payer: Self-pay

## 2021-03-23 ENCOUNTER — Ambulatory Visit: Payer: Medicaid Other | Admitting: Gerontology

## 2021-03-23 ENCOUNTER — Other Ambulatory Visit: Payer: Self-pay

## 2021-03-23 ENCOUNTER — Other Ambulatory Visit: Payer: Medicaid Other

## 2021-03-24 ENCOUNTER — Ambulatory Visit: Payer: Medicaid Other

## 2021-03-24 ENCOUNTER — Other Ambulatory Visit: Payer: Self-pay | Admitting: Gerontology

## 2021-03-24 ENCOUNTER — Other Ambulatory Visit: Payer: Self-pay

## 2021-03-24 DIAGNOSIS — I10 Essential (primary) hypertension: Secondary | ICD-10-CM

## 2021-03-24 DIAGNOSIS — Z79899 Other long term (current) drug therapy: Secondary | ICD-10-CM

## 2021-03-24 NOTE — Progress Notes (Signed)
[facility] Clinic Visit Note  Patient: Monique Edwards MRN: [id number] Date of Birth: Nov 17, 1965 PCP: Monique Reusing, NP   Monique Edwards 55 y.o. female presents for a yearly MTM visit today. Patient identified with two identifiers (name and DOB)  LMP 02/28/2014 (Approximate)   Patient Information   Past Medical History:  Diagnosis Date   COPD (chronic obstructive pulmonary disease) ([place])    Fatty liver    GERD (gastroesophageal reflux disease)    Hypertension       Past Surgical History:  Procedure Laterality Date   CHOLECYSTECTOMY     ESOPHAGOGASTRODUODENOSCOPY (EGD) WITH PROPOFOL N/A 06/06/2018   Procedure: ESOPHAGOGASTRODUODENOSCOPY (EGD) WITH PROPOFOL;  Surgeon: Monique Bellows, MD;  Location: [facility] ENDOSCOPY;  Service: Gastroenterology;  Laterality: N/A;     Family History  Problem Relation Age of Onset   Hypertension Mother    Stroke Mother    Diabetes Father    Arthritis Sister    Thyroid disease Sister    Hypertension Brother    Hypertension Brother     New Diagnoses (since last visit):   Family Support: Good  Lifestyle Diet: Breakfast: [place] sausage, egg, grits, toast, fruit  Lunch:tuna crackers, tuna sandwich, chips, salads  Dinner:chicken, broccoli, potatoes, salads, rice   Drinks:tea, diet soda, decaf coffee, water             Social History   Substance and Sexual Activity  Alcohol Use Yes   Comment: 2-3 beers every few weeks      Social History   Tobacco Use  Smoking Status Some Days   Years: 16.00   Types: Cigarettes   Start date: 11/04/1980  Smokeless Tobacco Never  Tobacco Comments   5-6 cigarettes per week 11/30/2020      Health Maintenance  Topic Date Due   COVID-19 Vaccine (1) Never done   HIV Screening  Never done   TETANUS/TDAP  Never done   Zoster Vaccines- Shingrix (1 of 2) Never done   COLONOSCOPY (Pts 45-29yrs Insurance coverage will need to be confirmed)  Never done   PAP SMEAR-Modifier  01/29/2021    Pneumococcal Vaccine 26-81 Years old (1 - PCV) 11/30/2021 (Originally 03/25/1970)   MAMMOGRAM  11/10/2022   INFLUENZA VACCINE  Completed   Hepatitis C Screening  Completed   HPV VACCINES  Aged Out   Health Maintenance/Date Completed  Last ED visit: 07/04/2019 Last Visit to PCP: 03/08/2021 Next Visit to PCP: needs to reschedule  Specialist Visit: 12/01/2020 Dental Exam: few years  Eye Exam: few years  Prostate Exam: N/A Pelvic/PAP Exam: does not recall last time Mammogram: few months ago DEXA: never received  Colonoscopy: needs to schedule Flu Vaccine: received  Pneumonia Vaccine: not received  COVID-19 Vaccine: received 3 doses  Shingrix Vaccine: not received   Outpatient Encounter Medications as of 03/24/2021  Medication Sig   albuterol (PROAIR HFA) 108 (90 Base) MCG/ACT inhaler Inhale 2 puffs into the lungs every 6 (six) hours as needed for wheezing or shortness of breath.   Blood Pressure Monitor KIT 1 kit by Does not apply route daily.   cetirizine (ZYRTEC) 10 MG tablet Take 1 tablet (10 mg total) by mouth once daily.   fluticasone (FLONASE) 50 MCG/ACT nasal spray Spray 1 spray into both nostrils once daily.   fluticasone-salmeterol (ADVAIR DISKUS) 250-50 MCG/ACT AEPB Inhale 1 puff into the lungs in the morning and at bedtime.   lactulose (CHRONULAC) 10 GM/15ML solution TAKE 15MLS BY MOUTH 3 TIMES A DAY   lidocaine (LIDODERM)  5 % Place 1 patch onto the skin topically once daily. Remove and discard patch within 12 hours or as directed by doctor.   losartan (COZAAR) 25 MG tablet TAKE ONE TABLET BY MOUTH EVERY DAY   omeprazole (PRILOSEC) 20 MG capsule Take 1 capsule ($RemoveBe'20mg'mCPKPzcwa$ ) by mouth once every day.   polyethylene glycol (GOLYTELY) 236 g solution Drink 8 oz every 20-30 minutes until entire prep is finished   polyethylene glycol-electrolytes (NULYTELY) 420 g solution Prepare according to package instructions. Starting at 5:00 PM: Drink one 8 oz glass of mixture every 15 minutes until  you finish half of the jug. Five hours prior to procedure, drink 8 oz glass of mixture every 15 minutes until it is all gone. Make sure you do not drink anything 4 hours prior to your procedure.   umeclidinium bromide (INCRUSE ELLIPTA) 62.5 MCG/ACT AEPB INHALE ONE PUFF INTO THE LUNGS ONCE DAILY.   XIFAXAN 550 MG TABS tablet Take 1 tablet (550 mg total) by mouth 2 (two) times daily.   Roflumilast (DALIRESP) 250 MCG TABS Take 1 tablet by mouth once daily. (Patient not taking: Reported on 03/08/2021)   No facility-administered encounter medications on file as of 03/24/2021.      Assessment and Plan: Medication adherence: patient reports good adherence to medications and is able to recall indications and directions appropriately.  HTN --Currently receiving losartan and reports no issues --Patient has blood pressure cuff, but has not checked blood pressure in months --Instructed patient to monitor blood pressure once or twice weekly and bring results to follow-up appointments at [facility] COPD --Currently receiving Incruse, Advair, and PRN albuterol and reports no issues  --Patient reports using the PRN albuterol more frequently recently and states it reduces symptoms occasionally  --[facility] is ordering roflumilast, but will provide to patient once received  Allergies --Currently receiving cetrizine and Flonase and reports no issues  Fatty liver  --Currently receiving lactulose and Xifaxan and reports no issues Pain --Currently receiving lidocaine patch, but reports pain is not relived it the patch --Instructed the patient to bring patch to next follow-up appointment at [facility] to discuss alternatives with primary care  Patient instructed to start monitoring blood pressure once to twice weekly. Patient was instructed to discuss lidocaine patch alternatives at next appointment at [facility]. Patient was provided with information about shingles and pneumonia vaccine. Patient encouraged to continue to dietary changes.  Patient encouraged to engage in exercise as tolerated. Patient was agreeable to all suggestions. Plan to return to clinic in 1 year for annual MTM.   RTC: 1 year   Narda Rutherford, PharmD Pharmacy Resident  03/24/2021 10:03 AM

## 2021-03-27 ENCOUNTER — Other Ambulatory Visit: Payer: Self-pay

## 2021-03-27 MED FILL — Umeclidinium Br Aero Powd Breath Act 62.5 MCG/ACT (Base Eq): RESPIRATORY_TRACT | 90 days supply | Qty: 90 | Fill #0 | Status: AC

## 2021-03-27 MED FILL — Omeprazole Cap Delayed Release 20 MG: ORAL | 30 days supply | Qty: 30 | Fill #2 | Status: AC

## 2021-03-28 ENCOUNTER — Other Ambulatory Visit: Payer: Self-pay

## 2021-03-28 MED ORDER — LOSARTAN POTASSIUM 25 MG PO TABS
ORAL_TABLET | Freq: Every day | ORAL | 1 refills | Status: AC
  Filled 2021-03-28: qty 90, 90d supply, fill #0
  Filled 2021-06-26: qty 90, 90d supply, fill #1

## 2021-03-29 ENCOUNTER — Ambulatory Visit: Payer: Medicaid Other | Admitting: Gerontology

## 2021-04-06 ENCOUNTER — Ambulatory Visit: Payer: Medicaid Other | Admitting: Gerontology

## 2021-04-06 ENCOUNTER — Other Ambulatory Visit: Payer: Self-pay

## 2021-04-18 ENCOUNTER — Telehealth: Payer: Self-pay

## 2021-04-18 NOTE — Telephone Encounter (Signed)
Patient needs a return call with the number of her Pulmonary referral.

## 2021-04-21 ENCOUNTER — Other Ambulatory Visit: Payer: Self-pay

## 2021-04-21 MED FILL — Omeprazole Cap Delayed Release 20 MG: ORAL | 30 days supply | Qty: 30 | Fill #3 | Status: AC

## 2021-04-24 ENCOUNTER — Other Ambulatory Visit: Payer: Self-pay

## 2021-04-25 ENCOUNTER — Telehealth: Payer: Self-pay | Admitting: Pharmacist

## 2021-04-25 NOTE — Telephone Encounter (Signed)
04/25/2021 12:50:01 PM - [employer]/Inccruse Renewal  Mailing forms for renewal to patient and provider

## 2021-05-09 ENCOUNTER — Ambulatory Visit: Payer: Medicaid Other | Admitting: Gerontology

## 2021-05-09 ENCOUNTER — Other Ambulatory Visit: Payer: Self-pay

## 2021-05-09 ENCOUNTER — Telehealth: Payer: Self-pay | Admitting: Pharmacist

## 2021-05-09 MED FILL — Fluticasone-Salmeterol Aer Powder BA 250-50 MCG/ACT: RESPIRATORY_TRACT | 90 days supply | Qty: 180 | Fill #0 | Status: AC

## 2021-05-09 NOTE — Telephone Encounter (Signed)
05/09/2021 11:00:06 AM - Charyl Dancer Ellipta [employer] renwal faxed  -- Rhetta Mura - Tuesday, May 09, 2021 10:59 AM --Faxed [employer] renewal for Incruse Ellipta.

## 2021-05-10 ENCOUNTER — Other Ambulatory Visit: Payer: Self-pay

## 2021-05-12 ENCOUNTER — Other Ambulatory Visit: Payer: Self-pay

## 2021-05-12 ENCOUNTER — Ambulatory Visit: Payer: Medicaid Other | Admitting: Primary Care

## 2021-05-15 ENCOUNTER — Telehealth: Payer: Self-pay | Admitting: Pulmonary Disease

## 2021-05-15 NOTE — Telephone Encounter (Signed)
She should wait until acute illness has resolved and back to baseline

## 2021-05-15 NOTE — Telephone Encounter (Signed)
Patient aware of recommendation, nothing further needed.

## 2021-05-15 NOTE — Telephone Encounter (Signed)
Pt states she has been sick for the last with either a cold or COPD flare up.pt states she has had coughing, wheezing, SOB. Denies fever but states she feels little warm.she is not on any Oxygen at home, she does not use a nebulizer machine, she has been using incuse, Advair and proair inhaler. Pt is vaccinated with Moderna x3

## 2021-05-15 NOTE — Telephone Encounter (Signed)
I haven't actually seen patient since July. Unfortunately needs office visit or needs to go to UC to be evaluated.

## 2021-05-15 NOTE — Telephone Encounter (Signed)
[name], please advise. You last seen patient 01/02/2021. Dr. Jayme Cloud has not seen patient previously.

## 2021-05-15 NOTE — Telephone Encounter (Signed)
Monique Edwards please advise: Patient currently received her RX for dialresp  Patient would like to know if it is safe to start medication now or to wait until she gets over her illness?

## 2021-05-19 ENCOUNTER — Other Ambulatory Visit: Payer: Self-pay | Admitting: Gerontology

## 2021-05-19 ENCOUNTER — Other Ambulatory Visit: Payer: Self-pay

## 2021-05-19 DIAGNOSIS — Z8709 Personal history of other diseases of the respiratory system: Secondary | ICD-10-CM

## 2021-05-23 ENCOUNTER — Other Ambulatory Visit: Payer: Self-pay

## 2021-05-23 ENCOUNTER — Other Ambulatory Visit: Payer: Self-pay | Admitting: Gerontology

## 2021-05-23 DIAGNOSIS — Z9109 Other allergy status, other than to drugs and biological substances: Secondary | ICD-10-CM

## 2021-05-23 MED ORDER — CETIRIZINE HCL 10 MG PO TABS
10.0000 mg | ORAL_TABLET | Freq: Every day | ORAL | 2 refills | Status: AC
  Filled 2021-05-23: qty 30, 30d supply, fill #0
  Filled 2021-07-14: qty 30, 30d supply, fill #1
  Filled 2021-08-18: qty 30, 30d supply, fill #2

## 2021-05-23 MED FILL — Omeprazole Cap Delayed Release 20 MG: ORAL | 30 days supply | Qty: 30 | Fill #0 | Status: AC

## 2021-05-23 MED FILL — Umeclidinium Br Aero Powd Breath Act 62.5 MCG/ACT (Base Eq): RESPIRATORY_TRACT | Qty: 30 | Fill #0 | Status: CN

## 2021-05-24 ENCOUNTER — Other Ambulatory Visit: Payer: Self-pay

## 2021-05-24 MED ORDER — XIFAXAN 550 MG PO TABS
550.0000 mg | ORAL_TABLET | Freq: Two times a day (BID) | ORAL | 3 refills | Status: DC
  Filled 2021-05-24: qty 180, 90d supply, fill #0

## 2021-05-25 ENCOUNTER — Ambulatory Visit: Payer: Medicaid Other | Admitting: Gerontology

## 2021-06-01 ENCOUNTER — Other Ambulatory Visit: Payer: Self-pay

## 2021-06-07 ENCOUNTER — Other Ambulatory Visit: Payer: Self-pay

## 2021-06-07 ENCOUNTER — Ambulatory Visit: Payer: Medicaid Other | Admitting: Gerontology

## 2021-06-07 VITALS — BP 131/86 | HR 59 | Temp 98.4°F | Ht 63.0 in | Wt 186.8 lb

## 2021-06-07 DIAGNOSIS — Z0000 Encounter for general adult medical examination without abnormal findings: Secondary | ICD-10-CM

## 2021-06-07 DIAGNOSIS — M25562 Pain in left knee: Secondary | ICD-10-CM

## 2021-06-07 DIAGNOSIS — G8929 Other chronic pain: Secondary | ICD-10-CM

## 2021-06-07 DIAGNOSIS — J441 Chronic obstructive pulmonary disease with (acute) exacerbation: Secondary | ICD-10-CM

## 2021-06-07 MED ORDER — ALBUTEROL SULFATE HFA 108 (90 BASE) MCG/ACT IN AERS
2.0000 | INHALATION_SPRAY | Freq: Four times a day (QID) | RESPIRATORY_TRACT | 3 refills | Status: DC
  Filled 2021-06-07: qty 6.7, 25d supply, fill #0
  Filled 2021-08-18: qty 6.7, 25d supply, fill #1

## 2021-06-07 MED ORDER — ROFLUMILAST 250 MCG PO TABS
1.0000 | ORAL_TABLET | Freq: Every day | ORAL | 0 refills | Status: DC
  Filled 2021-06-07: qty 28, fill #0

## 2021-06-07 NOTE — Progress Notes (Signed)
Established Patient Office Visit  Subjective:  Patient ID: Monique Edwards, female    DOB: 10-Mar-1966  Age: 56 y.o. MRN: [id number]  CC:  Chief Complaint  Patient presents with   Follow-up    General follow-up. Needs medication refills. Knee pain getting worse    HPI Monique Edwards  is a 56 yo female with a history of COPD, hypertension, GERD, and fatty liver disease who presents for follow up visit and medication refill. She states that her breathing is stable and smokes 1 cigarette in a while. She states that she's compliant with her medications, denies side effects and continues to make healthy lifestyle changes. She c/o intermittent sharp 7/10 pain to the back of her left knee. She states that her left knee buckles sometimes, but denies having any fall. She denies muscle nor motor weakness. She has active [facility] financial assistance and requests gastroenterology referral for Colonoscopy screening. Overall, she states that she's doing well and offers no further complaint.  Past Medical History:  Diagnosis Date   COPD (chronic obstructive pulmonary disease) ([place])    Fatty liver    GERD (gastroesophageal reflux disease)    Hypertension     Past Surgical History:  Procedure Laterality Date   CHOLECYSTECTOMY     ESOPHAGOGASTRODUODENOSCOPY (EGD) WITH PROPOFOL N/A 06/06/2018   Procedure: ESOPHAGOGASTRODUODENOSCOPY (EGD) WITH PROPOFOL;  Surgeon: Jonathon Bellows, MD;  Location: [facility] ENDOSCOPY;  Service: Gastroenterology;  Laterality: N/A;    Family History  Problem Relation Age of Onset   Hypertension Mother    Stroke Mother    Diabetes Father    Arthritis Sister    Thyroid disease Sister    Hypertension Brother    Hypertension Brother     Social History   Socioeconomic History   Marital status: Widowed    Spouse name: Not on file   Number of children: Not on file   Years of education: Not on file   Highest education level: Not on file  Occupational History   Occupation: Unemployed   Tobacco Use   Smoking status: Some Days    Years: 16.00    Types: Cigarettes    Start date: 11/04/1980   Smokeless tobacco: Never   Tobacco comments:    3-4 cigarettes per week  Vaping Use   Vaping Use: Never used  Substance and Sexual Activity   Alcohol use: Yes    Comment: 2-3 beers every few weeks   Drug use: Not Currently    Types: Cocaine    Comment: last use 2021   Sexual activity: Yes    Partners: Male  Other Topics Concern   Not on file  Social History Narrative   Not on file   Social Determinants of Health   Financial Resource Strain: Not on file  Food Insecurity: No Food Insecurity   Worried About Running Out of Food in the Last Year: Never true   [place] in the Last Year: Never true  Transportation Needs: No Transportation Needs   Lack of Transportation (Medical): No   Lack of Transportation (Non-Medical): No  Physical Activity: Not on file  Stress: Not on file  Social Connections: Not on file  Intimate Partner Violence: Not on file    Outpatient Medications Prior to Visit  Medication Sig Dispense Refill   cetirizine (ZYRTEC) 10 MG tablet Take 1 tablet (10 mg total) by mouth once daily. 30 tablet 2   fluticasone (FLONASE) 50 MCG/ACT nasal spray Spray 1 spray into both nostrils once  daily. 16 g 2   fluticasone-salmeterol (ADVAIR DISKUS) 250-50 MCG/ACT AEPB Inhale 1 puff into the lungs in the morning and at bedtime. 180 each 2   losartan (COZAAR) 25 MG tablet TAKE ONE TABLET BY MOUTH ONCE EVERY DAY. 90 tablet 1   omeprazole (PRILOSEC) 20 MG capsule Take 1 capsule ($RemoveBe'20mg'BSrqUOyhD$ ) by mouth once every day. 90 capsule 1   umeclidinium bromide (INCRUSE ELLIPTA) 62.5 MCG/ACT AEPB INHALE ONE PUFF INTO THE LUNGS ONCE DAILY. 30 each 2   XIFAXAN 550 MG TABS tablet Take 1 tablet (550 mg total) by mouth 2 (two) times daily. 180 tablet 3   albuterol (PROAIR HFA) 108 (90 Base) MCG/ACT inhaler Inhale 2 puffs into the lungs every 6 (six) hours as needed for wheezing or  shortness of breath. 34 g 3   Roflumilast (DALIRESP) 250 MCG TABS Take 1 tablet by mouth once daily. 28 tablet 0   Blood Pressure Monitor KIT 1 kit by Does not apply route daily. (Patient not taking: Reported on 06/07/2021) 1 kit 0   polyethylene glycol-electrolytes (NULYTELY) 420 g solution Prepare according to package instructions. Starting at 5:00 PM: Drink one 8 oz glass of mixture every 15 minutes until you finish half of the jug. Five hours prior to procedure, drink 8 oz glass of mixture every 15 minutes until it is all gone. Make sure you do not drink anything 4 hours prior to your procedure. 4000 mL 0   lidocaine (LIDODERM) 5 % Place 1 patch onto the skin topically once daily. Remove and discard patch within 12 hours or as directed by doctor. (Patient not taking: Reported on 06/07/2021) 30 patch 0   polyethylene glycol (GOLYTELY) 236 g solution Drink 8 oz every 20-30 minutes until entire prep is finished 4000 mL 0   No facility-administered medications prior to visit.    Allergies  Allergen Reactions   Amlodipine Swelling    Peripheral edema   Lisinopril Cough    ROS Review of Systems  Constitutional: Negative.   Eyes: Negative.   Respiratory: Negative.    Cardiovascular: Negative.   Gastrointestinal: Negative.   Endocrine: Negative.   Musculoskeletal:  Positive for arthralgias (chronic knee pain).  Neurological: Negative.   Hematological: Negative.   Psychiatric/Behavioral: Negative.       Objective:    Physical Exam HENT:     Head: Normocephalic and atraumatic.     Mouth/Throat:     Mouth: Mucous membranes are moist.  Eyes:     Extraocular Movements: Extraocular movements intact.     Conjunctiva/sclera: Conjunctivae normal.     Pupils: Pupils are equal, round, and reactive to light.  Cardiovascular:     Rate and Rhythm: Regular rhythm. Bradycardia present.     Pulses: Normal pulses.     Heart sounds: Normal heart sounds.  Pulmonary:     Effort: Pulmonary effort is  normal.     Breath sounds: Normal breath sounds.  Abdominal:     General: Bowel sounds are normal. There is no distension.     Palpations: Abdomen is soft.     Tenderness: There is no abdominal tenderness. There is no guarding.  Musculoskeletal:        General: Normal range of motion.  Skin:    General: Skin is warm.  Neurological:     General: No focal deficit present.     Mental Status: She is alert and oriented to person, place, and time. Mental status is at baseline.  Psychiatric:  Mood and Affect: Mood normal.        Behavior: Behavior normal.        Thought Content: Thought content normal.        Judgment: Judgment normal.    BP 131/86 (BP Location: Right Arm, Patient Position: Sitting, Cuff Size: Normal)    Pulse (!) 59    Temp 98.4 F (36.9 C)    Ht $R'5\' 3"'lX$  (1.6 m)    Wt 186 lb 12.8 oz (84.7 kg)    LMP 02/28/2014 (Approximate)    SpO2 98%    BMI 33.09 kg/m  Wt Readings from Last 3 Encounters:  06/07/21 186 lb 12.8 oz (84.7 kg)  03/08/21 181 lb 8 oz (82.3 kg)  01/26/21 175 lb 3.2 oz (79.5 kg)   Encouraged weight loss  Health Maintenance Due  Topic Date Due   COVID-19 Vaccine (1) Never done   HIV Screening  Never done   TETANUS/TDAP  Never done   Zoster Vaccines- Shingrix (1 of 2) Never done   COLONOSCOPY (Pts 45-88yrs Insurance coverage will need to be confirmed)  Never done   PAP SMEAR-Modifier  01/29/2021    There are no preventive care reminders to display for this patient.  Lab Results  Component Value Date   TSH 1.740 03/08/2021   Lab Results  Component Value Date   WBC 3.9 03/08/2021   HGB 13.3 03/08/2021   HCT 39.8 03/08/2021   MCV 92 03/08/2021   PLT 81 (LL) 03/08/2021   Lab Results  Component Value Date   NA 141 03/08/2021   K 4.3 03/08/2021   CO2 21 03/08/2021   GLUCOSE 84 03/08/2021   BUN 16 03/08/2021   CREATININE 1.05 (H) 03/08/2021   BILITOT 0.2 03/08/2021   ALKPHOS 63 03/08/2021   AST 21 03/08/2021   ALT 17 03/08/2021   PROT  7.1 03/08/2021   ALBUMIN 4.5 03/08/2021   CALCIUM 9.1 03/08/2021   ANIONGAP 13 07/04/2019   EGFR 62 03/08/2021   Lab Results  Component Value Date   CHOL 166 03/08/2021   Lab Results  Component Value Date   HDL 77 03/08/2021   Lab Results  Component Value Date   LDLCALC 73 03/08/2021   Lab Results  Component Value Date   TRIG 86 03/08/2021   Lab Results  Component Value Date   CHOLHDL 2.2 03/08/2021   Lab Results  Component Value Date   HGBA1C 5.4 03/08/2021      Assessment & Plan:    1. COPD exacerbation ([place]) - Her breathing is stable, will continue current medication. - Roflumilast (DALIRESP) 250 MCG TABS; Take 1 tablet by mouth once daily.  Dispense: 28 tablet; Refill: 0  2. Chronic pain of left knee - Denies pain to left knee with palpation, she was advised to monitor and notify clinic with recurring pain.  3. Health care maintenance -Will recheck cbc and follow up with Gastroenterology for colonoscopy screening. - CBC w/Diff; Future - Ambulatory referral to Gastroenterology     Follow-up: Return in about 16 weeks (around 09/27/2021), or if symptoms worsen or fail to improve.    Halyn Flaugher Jerold Coombe, NP

## 2021-06-08 ENCOUNTER — Other Ambulatory Visit: Payer: Self-pay

## 2021-06-14 ENCOUNTER — Telehealth: Payer: Self-pay

## 2021-06-14 NOTE — Telephone Encounter (Signed)
Called no answer no voicemail

## 2021-06-15 ENCOUNTER — Telehealth: Payer: Self-pay

## 2021-06-15 NOTE — Telephone Encounter (Signed)
CALLED PATIENT NO ANSWER LEFT VOICEMAIL FOR A CALL BACK °Letter sent °

## 2021-06-16 ENCOUNTER — Other Ambulatory Visit: Payer: Self-pay

## 2021-06-19 ENCOUNTER — Other Ambulatory Visit: Payer: Self-pay

## 2021-06-19 MED FILL — Omeprazole Cap Delayed Release 20 MG: ORAL | 30 days supply | Qty: 30 | Fill #1 | Status: AC

## 2021-06-26 ENCOUNTER — Other Ambulatory Visit: Payer: Self-pay

## 2021-06-26 DIAGNOSIS — Z86010 Personal history of colonic polyps: Secondary | ICD-10-CM

## 2021-06-26 MED FILL — Umeclidinium Br Aero Powd Breath Act 62.5 MCG/ACT (Base Eq): RESPIRATORY_TRACT | 90 days supply | Qty: 90 | Fill #0 | Status: AC

## 2021-06-26 NOTE — Progress Notes (Signed)
Patient already has prep medicine and its still in date

## 2021-06-26 NOTE — Progress Notes (Signed)
Gastroenterology Pre-Procedure Review  Request Date: 07/13/2021 Requesting Physician: Dr. Vicente Males  PATIENT REVIEW QUESTIONS: The patient responded to the following health history questions as indicated:    1. Are you having any GI issues? no 2. Do you have a personal history of Polyps? Yes  3. Do you have a family history of Colon Cancer or Polyps? no 4. Diabetes Mellitus? no 5. Joint replacements in the past 12 months?no 6. Major health problems in the past 3 months?no 7. Any artificial heart valves, MVP, or defibrillator?no    MEDICATIONS & ALLERGIES:    Patient reports the following regarding taking any anticoagulation/antiplatelet therapy:   Plavix, Coumadin, Eliquis, Xarelto, Lovenox, Pradaxa, Brilinta, or Effient? no Aspirin? no  Patient confirms/reports the following medications:  Current Outpatient Medications  Medication Sig Dispense Refill   albuterol (PROVENTIL HFA) 108 (90 Base) MCG/ACT inhaler Inhale 2 puffs into the lungs every 6 (six) hours as needed for wheezing or shortness of breath. 26.8 g 3   Blood Pressure Monitor KIT 1 kit by Does not apply route daily. 1 kit 0   cetirizine (ZYRTEC) 10 MG tablet Take 1 tablet (10 mg total) by mouth once daily. 30 tablet 2   fluticasone (FLONASE) 50 MCG/ACT nasal spray Spray 1 spray into both nostrils once daily. 16 g 2   fluticasone-salmeterol (ADVAIR DISKUS) 250-50 MCG/ACT AEPB Inhale 1 puff into the lungs in the morning and at bedtime. 180 each 2   losartan (COZAAR) 25 MG tablet TAKE ONE TABLET BY MOUTH ONCE EVERY DAY. 90 tablet 1   omeprazole (PRILOSEC) 20 MG capsule Take 1 capsule (84m) by mouth once every day. 90 capsule 1   polyethylene glycol-electrolytes (NULYTELY) 420 g solution Prepare according to package instructions. Starting at 5:00 PM: Drink one 8 oz glass of mixture every 15 minutes until you finish half of the jug. Five hours prior to procedure, drink 8 oz glass of mixture every 15 minutes until it is all gone. Make  sure you do not drink anything 4 hours prior to your procedure. 4000 mL 0   Roflumilast (DALIRESP) 250 MCG TABS Take 1 tablet by mouth once daily. 28 tablet 0   umeclidinium bromide (INCRUSE ELLIPTA) 62.5 MCG/ACT AEPB INHALE ONE PUFF INTO THE LUNGS ONCE DAILY. 30 each 2   XIFAXAN 550 MG TABS tablet Take 1 tablet (550 mg total) by mouth 2 (two) times daily. 180 tablet 3   No current facility-administered medications for this visit.    Patient confirms/reports the following allergies:  Allergies  Allergen Reactions   Amlodipine Swelling    Peripheral edema   Lisinopril Cough    No orders of the defined types were placed in this encounter.   AUTHORIZATION INFORMATION Primary Insurance: 1D#: Group #:  Secondary Insurance: 1D#: Group #:  SCHEDULE INFORMATION: Date: 07/13/2021 Time: Location:[facility]

## 2021-07-06 ENCOUNTER — Other Ambulatory Visit: Payer: Self-pay

## 2021-07-12 ENCOUNTER — Other Ambulatory Visit: Payer: Self-pay

## 2021-07-12 ENCOUNTER — Telehealth: Payer: Self-pay

## 2021-07-12 MED ORDER — PEG 3350-KCL-NABCB-NACL-NASULF 236 G PO SOLR
4000.0000 mL | Freq: Once | ORAL | 0 refills | Status: AC
  Filled 2021-07-12: qty 4000, 1d supply, fill #0

## 2021-07-12 NOTE — Telephone Encounter (Signed)
Called patient and rescheduled she didn't do prep right rescheduled for 08/02/2021 called endo and sent new refferal and sent new communications  ?

## 2021-07-13 ENCOUNTER — Other Ambulatory Visit: Payer: Self-pay

## 2021-07-14 ENCOUNTER — Other Ambulatory Visit: Payer: Self-pay

## 2021-07-14 MED FILL — Omeprazole Cap Delayed Release 20 MG: ORAL | 30 days supply | Qty: 30 | Fill #2 | Status: AC

## 2021-08-01 ENCOUNTER — Telehealth: Payer: Self-pay

## 2021-08-01 NOTE — Telephone Encounter (Signed)
Patient is needing to reschedule colonoscopy  ?

## 2021-08-01 NOTE — Telephone Encounter (Signed)
CALLED PATIENT BACK TO RESCHEDULE APPOINTMENT SHE WANTED TO BE MOVED TO 10/16/2021 CALLED ENDO AND SENT NEW REFFERAL AND NEW LETTERS OUT ?

## 2021-08-02 ENCOUNTER — Ambulatory Visit: Payer: Medicaid Other

## 2021-08-08 ENCOUNTER — Other Ambulatory Visit: Payer: Self-pay

## 2021-08-08 MED FILL — Omeprazole Cap Delayed Release 20 MG: ORAL | 30 days supply | Qty: 30 | Fill #3 | Status: AC

## 2021-08-11 ENCOUNTER — Telehealth: Payer: Self-pay | Admitting: Pharmacist

## 2021-08-11 NOTE — Telephone Encounter (Signed)
08/11/2021 11:48:45 AM - Advair/Xifaxan renewal forms to pt & dr ?-- Arletha Pili - Friday, August 11, 2021 11:44 AM -- Renewal forms for Advair to pt & [facility] provider in folder on my desk for signature. Xifaxan to pt & dr for signature. Also request POI & taxes from pt. ?

## 2021-08-13 IMAGING — MG MM DIGITAL SCREENING BILAT W/ TOMO AND CAD
8 series · 8 of 24 positions shown · non-contrast
Comparison: Previous exam(s).

CLINICAL DATA: Screening.

EXAM:
DIGITAL SCREENING BILATERAL MAMMOGRAM WITH TOMOSYNTHESIS AND CAD
TECHNIQUE: Bilateral screening digital craniocaudal and mediolateral oblique
mammograms were obtained. Bilateral screening digital breast
tomosynthesis was performed. The images were evaluated with
computer-aided detection.

[R CC synth-2D]
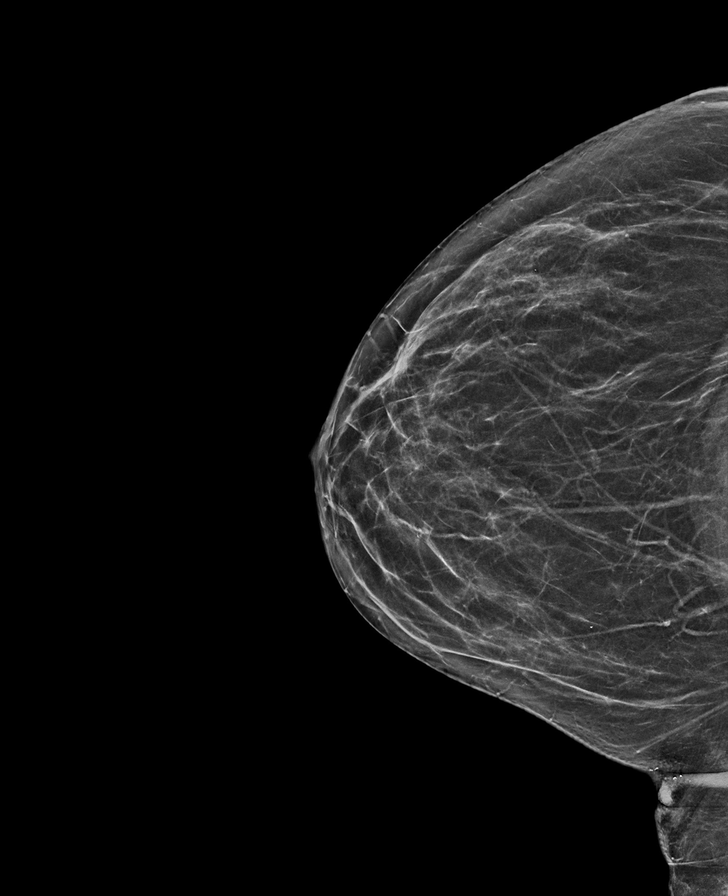

[L MLO synth-2D]
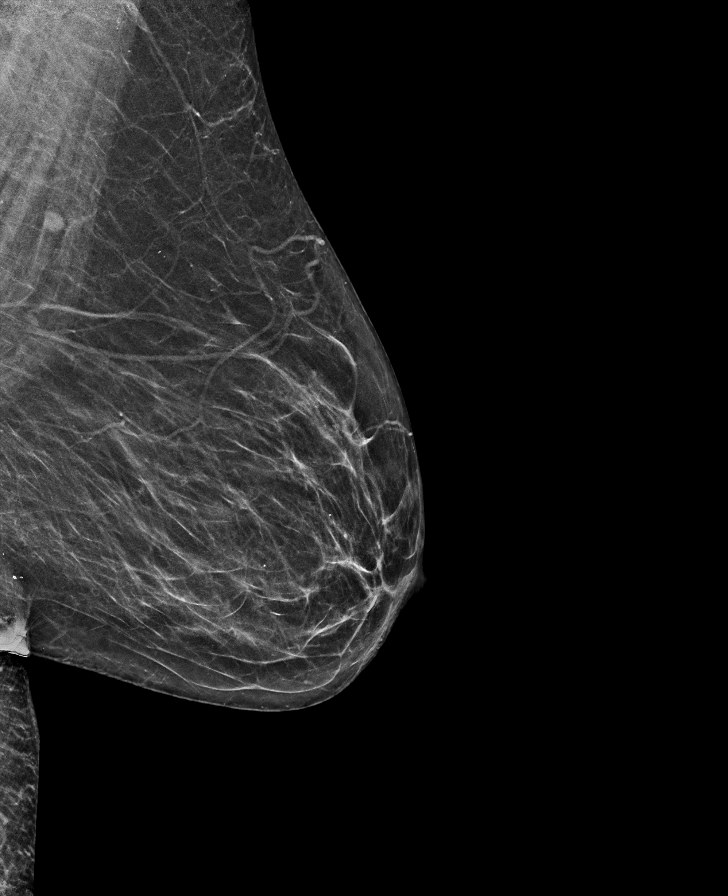

[L CC synth-2D]
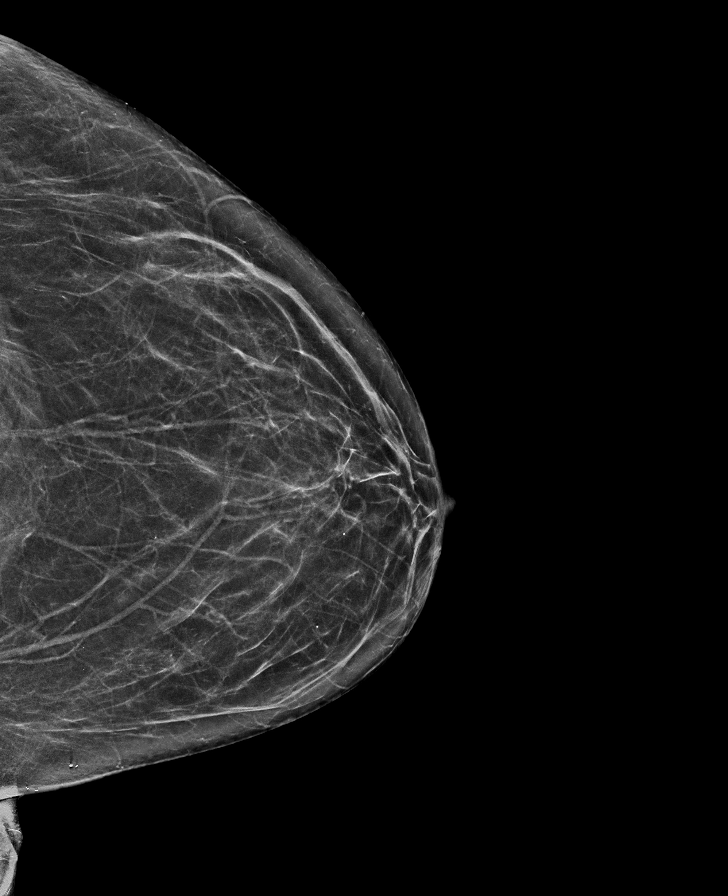

[R MLO synth-2D]
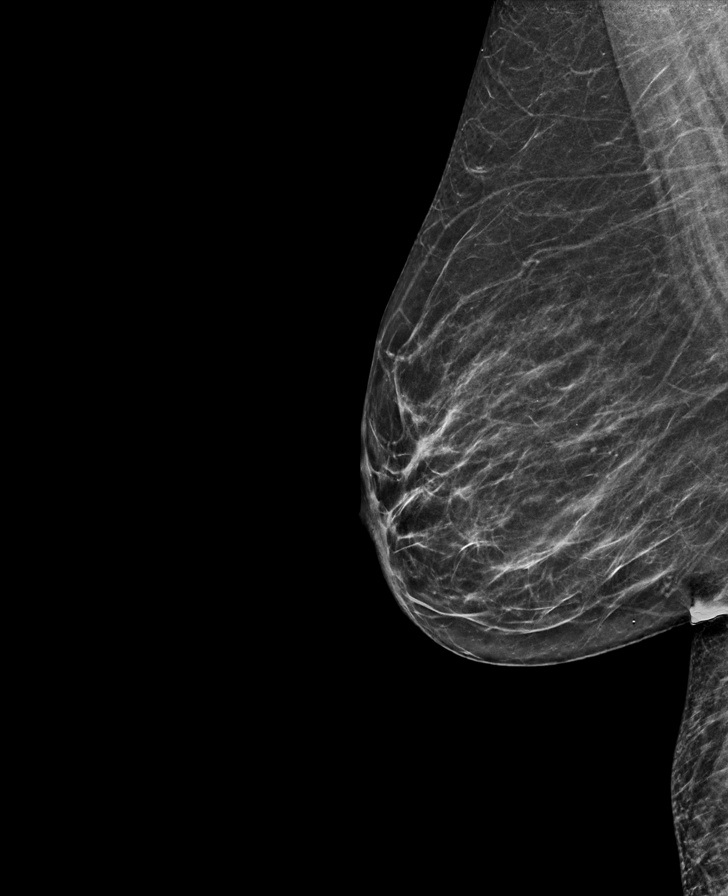

[R MLO tomo · tomo slice 28/55.0]
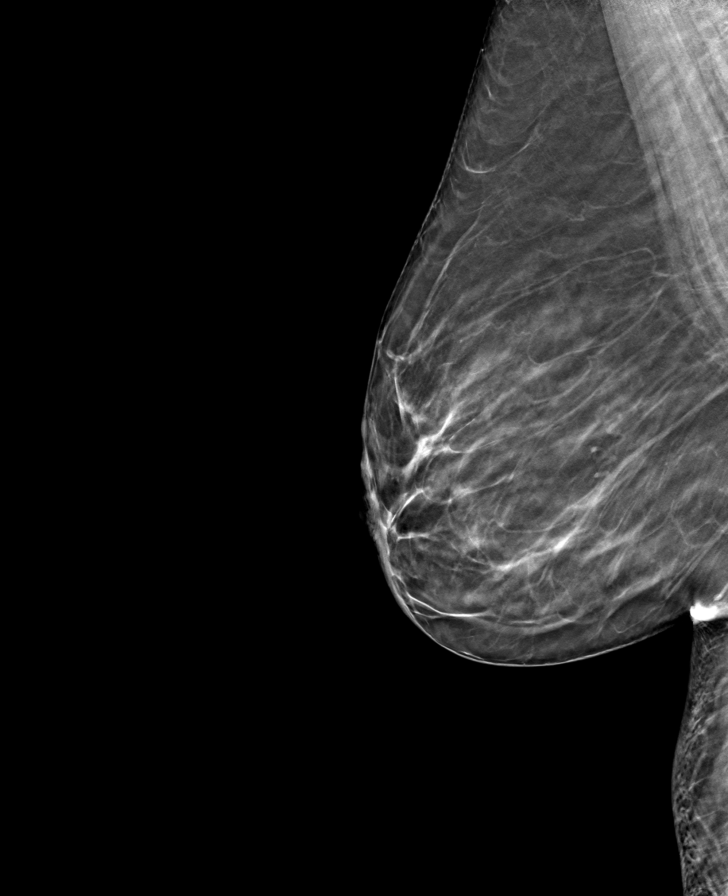

[L CC tomo · tomo slice 31/60.0]
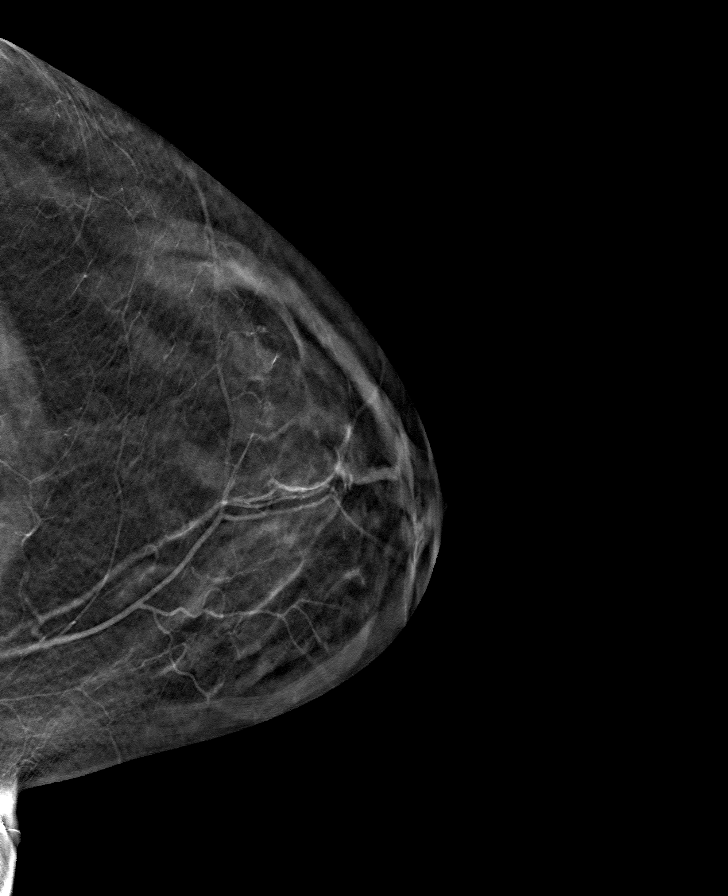

[R CC tomo · tomo slice 31/61.0]
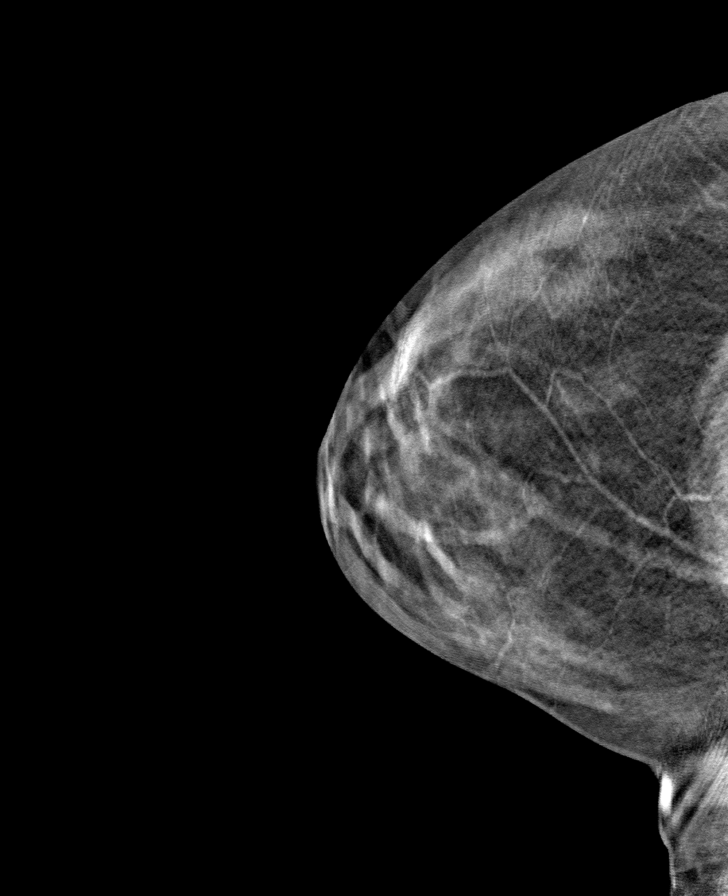

[L MLO tomo · tomo slice 29/56.0]
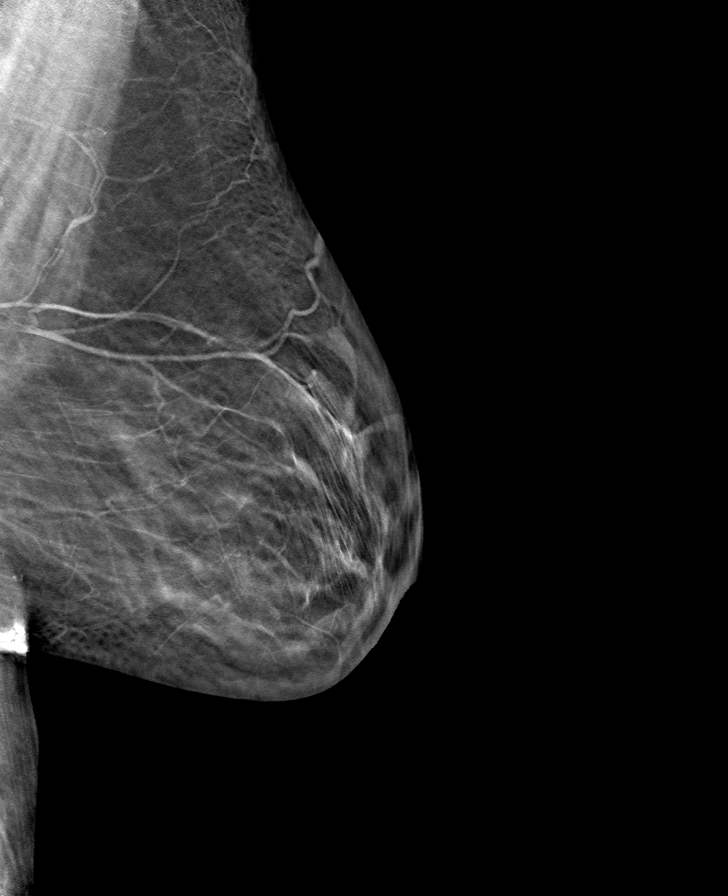

[8 of 24 positions shown; findings below may reference images not displayed]

ACR Breast Density Category b: There are scattered areas of
fibroglandular density.
FINDINGS: There are no findings suspicious for malignancy.
IMPRESSION: No mammographic evidence of malignancy. A result letter of this
screening mammogram will be mailed directly to the patient.

RECOMMENDATION:
Screening mammogram in one year. (Code:51-O-LD2)

BI-RADS CATEGORY  1: Negative.

## 2021-08-18 ENCOUNTER — Other Ambulatory Visit: Payer: Self-pay

## 2021-08-21 ENCOUNTER — Other Ambulatory Visit: Payer: Self-pay | Admitting: Gerontology

## 2021-08-21 ENCOUNTER — Other Ambulatory Visit: Payer: Self-pay

## 2021-08-21 DIAGNOSIS — Z8709 Personal history of other diseases of the respiratory system: Secondary | ICD-10-CM

## 2021-08-22 ENCOUNTER — Other Ambulatory Visit: Payer: Self-pay

## 2021-08-22 MED FILL — Umeclidinium Br Aero Powd Breath Act 62.5 MCG/ACT (Base Eq): RESPIRATORY_TRACT | Qty: 30 | Fill #0 | Status: CN

## 2021-08-31 ENCOUNTER — Other Ambulatory Visit: Payer: Self-pay

## 2021-08-31 ENCOUNTER — Telehealth: Payer: Self-pay | Admitting: Pharmacy Technician

## 2021-08-31 NOTE — Telephone Encounter (Signed)
Patient has Medicaid with prescription drug coverage.  No longer meets [facility]'s eligibility criteria.  [name] J. Nathalie Cavendish Care Manager [facility] 

## 2021-09-01 ENCOUNTER — Other Ambulatory Visit: Payer: Self-pay

## 2021-09-04 ENCOUNTER — Other Ambulatory Visit: Payer: Self-pay

## 2021-09-05 ENCOUNTER — Other Ambulatory Visit: Payer: Self-pay

## 2021-09-06 ENCOUNTER — Other Ambulatory Visit: Payer: Self-pay

## 2021-09-25 DIAGNOSIS — I10 Essential (primary) hypertension: Secondary | ICD-10-CM | POA: Diagnosis not present

## 2021-09-25 DIAGNOSIS — H5213 Myopia, bilateral: Secondary | ICD-10-CM | POA: Diagnosis not present

## 2021-09-25 DIAGNOSIS — J302 Other seasonal allergic rhinitis: Secondary | ICD-10-CM | POA: Diagnosis not present

## 2021-09-27 ENCOUNTER — Ambulatory Visit: Payer: Medicaid Other | Admitting: Gerontology

## 2021-10-04 ENCOUNTER — Ambulatory Visit: Payer: Medicaid Other | Admitting: Gerontology

## 2021-10-13 ENCOUNTER — Telehealth: Payer: Self-pay

## 2021-10-13 ENCOUNTER — Other Ambulatory Visit: Payer: Self-pay

## 2021-10-13 DIAGNOSIS — Z86010 Personal history of colonic polyps: Secondary | ICD-10-CM

## 2021-10-13 NOTE — Telephone Encounter (Signed)
Patient contacted office to reschedule her colonoscopy with Dr. Tobi Bastos.  During her reschedule she stated that she is past due for her hepatitis B (please confirm) vaccine. Please call her to schedule her nurse visit for the hepatitis vaccine.  Thanks,  [place], [place]

## 2021-10-13 NOTE — Telephone Encounter (Signed)
Patient contacted office to reschedule her colonoscopy.  Colonoscopy has been rescheduled to Monday 11/13/21 with Dr. Tobi Bastos.  Boyd Kerbs in Endo has been notified of date change.  Thanks,  [place]

## 2021-10-16 ENCOUNTER — Encounter: Admission: RE | Payer: Self-pay | Source: Ambulatory Visit

## 2021-10-16 ENCOUNTER — Ambulatory Visit: Admission: RE | Admit: 2021-10-16 | Payer: Medicaid Other | Source: Ambulatory Visit | Admitting: Gastroenterology

## 2021-10-16 SURGERY — COLONOSCOPY WITH PROPOFOL
Anesthesia: General

## 2021-10-17 NOTE — Telephone Encounter (Signed)
Called patient to let her know that she is able to proceed with her Hepatitis A/B vaccine per Dr. Vicente Males after reviewing the [facility] recommendations.

## 2021-10-17 NOTE — Telephone Encounter (Signed)
Called patient to let her know that she is able to continue with her hepatitis A/B vaccine. Therefore, she can come any time to receive it. Patient stated that she will come in tomorrow-10/18/2021 at 2:00 PM.

## 2021-10-18 ENCOUNTER — Ambulatory Visit: Payer: Medicaid Other | Admitting: Gastroenterology

## 2021-10-18 DIAGNOSIS — Z23 Encounter for immunization: Secondary | ICD-10-CM

## 2021-11-08 ENCOUNTER — Telehealth: Payer: Self-pay | Admitting: Pulmonary Disease

## 2021-11-08 DIAGNOSIS — Z8709 Personal history of other diseases of the respiratory system: Secondary | ICD-10-CM

## 2021-11-08 MED ORDER — INCRUSE ELLIPTA 62.5 MCG/ACT IN AEPB: INHALATION_SPRAY | RESPIRATORY_TRACT | 1 refills | Status: DC

## 2021-11-08 MED ORDER — FLUTICASONE-SALMETEROL 250-50 MCG/ACT IN AEPB: INHALATION_SPRAY | RESPIRATORY_TRACT | 1 refills | Status: DC

## 2021-11-08 NOTE — Telephone Encounter (Signed)
One month supply of advair and incruse has been sent to preferred pharmacy. Patient is aware and voiced her understanding.  Nothing further needed.

## 2021-11-08 NOTE — Progress Notes (Signed)
Pt here for Twinrix #3

## 2021-11-09 ENCOUNTER — Telehealth: Payer: Self-pay | Admitting: Pulmonary Disease

## 2021-11-09 ENCOUNTER — Other Ambulatory Visit: Payer: Self-pay

## 2021-11-09 NOTE — Telephone Encounter (Signed)
Spoke to patient, who stated that incruse requires PA.   PA team, can you guys submit PA for incruse. Thanks

## 2021-11-10 ENCOUNTER — Other Ambulatory Visit (HOSPITAL_COMMUNITY): Payer: Self-pay

## 2021-11-13 ENCOUNTER — Encounter: Admission: RE | Payer: Self-pay | Source: Home / Self Care

## 2021-11-13 ENCOUNTER — Encounter: Payer: Self-pay | Admitting: Certified Registered"

## 2021-11-13 ENCOUNTER — Ambulatory Visit: Admission: RE | Admit: 2021-11-13 | Payer: Medicaid Other | Source: Home / Self Care | Admitting: Gastroenterology

## 2021-11-13 SURGERY — COLONOSCOPY WITH PROPOFOL
Anesthesia: General

## 2021-11-15 NOTE — Telephone Encounter (Signed)
Spoke to patient. She stated that she is completely out of incruse.   PA team, please advise. Thanks

## 2021-11-16 ENCOUNTER — Other Ambulatory Visit (HOSPITAL_COMMUNITY): Payer: Self-pay

## 2021-11-16 ENCOUNTER — Telehealth: Payer: Self-pay | Admitting: Pharmacy Technician

## 2021-11-16 NOTE — Telephone Encounter (Signed)
Patient Advocate Encounter  Received message from office staff that pt was out of medication and it needed a PA.    Per Test Claim: covered - contacted pharmacy to process- they only had the pt's Medicaid on file.

## 2021-11-16 NOTE — Telephone Encounter (Signed)
ATC patient--unable to leave vm due to mailbox not being setup.  °

## 2021-11-17 NOTE — Telephone Encounter (Signed)
ATC x2--unable to leave vm due to mailbox not being setup.  Will close encounter per office protocol. Letter mailed to address on file.

## 2021-11-20 ENCOUNTER — Ambulatory Visit (INDEPENDENT_AMBULATORY_CARE_PROVIDER_SITE_OTHER): Payer: Medicare Other | Admitting: Gastroenterology

## 2021-11-20 DIAGNOSIS — Z23 Encounter for immunization: Secondary | ICD-10-CM | POA: Diagnosis not present

## 2021-11-20 NOTE — Progress Notes (Signed)
Per orders of Dr. Tobi Bastos, injection of Hep A/B Twinrix 3 of 3 given in  by [facility], Brendia Sacks.  Patient tolerated injection well.

## 2021-11-24 ENCOUNTER — Other Ambulatory Visit (HOSPITAL_COMMUNITY): Payer: Self-pay

## 2021-12-27 ENCOUNTER — Other Ambulatory Visit: Payer: Self-pay

## 2022-01-09 ENCOUNTER — Ambulatory Visit (INDEPENDENT_AMBULATORY_CARE_PROVIDER_SITE_OTHER): Payer: Medicare Other | Admitting: Adult Health

## 2022-01-09 ENCOUNTER — Ambulatory Visit
Admission: RE | Admit: 2022-01-09 | Discharge: 2022-01-09 | Disposition: A | Discharge status: Home / Self Care | Payer: Medicare Other | Source: Ambulatory Visit | Attending: Adult Health | Admitting: Adult Health

## 2022-01-09 ENCOUNTER — Encounter: Payer: Self-pay | Admitting: Adult Health

## 2022-01-09 VITALS — BP 116/70 | HR 67 | Temp 98.2°F | Ht 64.0 in | Wt 188.8 lb

## 2022-01-09 DIAGNOSIS — J441 Chronic obstructive pulmonary disease with (acute) exacerbation: Secondary | ICD-10-CM

## 2022-01-09 DIAGNOSIS — J42 Unspecified chronic bronchitis: Secondary | ICD-10-CM

## 2022-01-09 DIAGNOSIS — J449 Chronic obstructive pulmonary disease, unspecified: Secondary | ICD-10-CM | POA: Diagnosis not present

## 2022-01-09 DIAGNOSIS — F17200 Nicotine dependence, unspecified, uncomplicated: Secondary | ICD-10-CM

## 2022-01-09 DIAGNOSIS — Z8709 Personal history of other diseases of the respiratory system: Secondary | ICD-10-CM | POA: Diagnosis not present

## 2022-01-09 MED ORDER — INCRUSE ELLIPTA 62.5 MCG/ACT IN AEPB: INHALATION_SPRAY | RESPIRATORY_TRACT | 2 refills | Status: DC

## 2022-01-09 MED ORDER — FLUTICASONE-SALMETEROL 250-50 MCG/ACT IN AEPB: INHALATION_SPRAY | RESPIRATORY_TRACT | 2 refills | Status: DC

## 2022-01-09 MED ORDER — ROFLUMILAST 250 MCG PO TABS: 1.0000 | ORAL_TABLET | Freq: Every day | ORAL | 2 refills | Status: DC

## 2022-01-09 MED ORDER — ALBUTEROL SULFATE HFA 108 (90 BASE) MCG/ACT IN AERS: 2.0000 | INHALATION_SPRAY | Freq: Four times a day (QID) | RESPIRATORY_TRACT | 3 refills | Status: DC

## 2022-01-09 NOTE — Addendum Note (Signed)
Addended by: Delrae Rend on: 01/09/2022 03:27 PM   Modules accepted: Orders

## 2022-01-09 NOTE — Patient Instructions (Addendum)
Refer to Lung cancer screening CT chest program.  Work on not smoking .  Continue on Advair and Incruse Inhaler  Albuterol inhaler As needed   Continue on Daliresp daily .  Follow up Dr. Aundria Rud in 3 months with PFT and As needed  . (New patient slot)

## 2022-01-09 NOTE — Assessment & Plan Note (Signed)
COPD with emphysema compensated on present regimen with Advair, Incruse and Daliresp.  Needs PFTs.  We will set up PFTs for return visit.  Chest x-ray today.  Encouraged on smoking cessation.  Refer to the lung cancer screening program  Plan  Patient Instructions  Refer to Lung cancer screening CT chest program.  Work on not smoking .  Continue on Advair and Incruse Inhaler  Albuterol inhaler As needed   Continue on Daliresp daily .  Follow up Dr. Aundria Rud in 3 months with PFT and As needed  . (New patient slot)    '

## 2022-01-09 NOTE — Assessment & Plan Note (Signed)
Encouraged on smoking cessation. Referred to the lung cancer screening program  

## 2022-01-09 NOTE — Progress Notes (Signed)
Has received 3 Pfizer covid vaccines.

## 2022-01-09 NOTE — Progress Notes (Signed)
[facility]   '@Patient'$  ID: Monique Edwards, female    DOB: 04-Mar-1966, 56 y.o.   MRN: [id number]  Chief Complaint  Patient presents with   Follow-up    Referring provider: Langston Reusing, NP  HPI: 56 year old female active smoker followed for COPD with emphysema Medical history significant for hypertension, depression history of alcohol abuse  TEST/EVENTS :   01/09/2022 Follow up ; COPD with emphysema  Patient presents for a 1 year follow-up.  Patient has underlying COPD with emphysema.  She continues smoke.  Discussed smoking cessation in detail.  Patient remains on Advair, Incruse and Daliresp.  Patient says she needs refills.  Patient says overall breathing is doing about the same.  She gets short of breath with heavy activity.  Denies any flare of cough or wheezing.  No recent chest x-ray.  No PFTs on file. Started smoking around 56 yo. 1 PPD at heaviest , Decreased last year 2 cigs a day. 25 yr pk hx  Disabled due to arthritis and COPD. No active , sedentary . Drives , lives at home. Able to do light ADL and chores.   Allergies  Allergen Reactions   Amlodipine Swelling    Peripheral edema   Lisinopril Cough    Immunization History  Administered Date(s) Administered   Hep A / Hep B 04/26/2020, 10/18/2021, 11/20/2021   Influenza, Seasonal, Injecte, Preservative Fre 03/09/2013   Influenza,inj,Quad PF,6+ Mos 03/04/2015, 02/27/2018, 03/11/2019   Influenza-Unspecified 03/23/2019, 01/26/2021    Past Medical History:  Diagnosis Date   COPD (chronic obstructive pulmonary disease) ([place])    Fatty liver    GERD (gastroesophageal reflux disease)    Hypertension     Tobacco History: Social History   Tobacco Use  Smoking Status Some Days   Years: 16.00   Types: Cigarettes   Start date: 11/04/1980  Smokeless Tobacco Never  Tobacco Comments   Smoking 2 cigarettes per week.  Trying to quit. 01/09/2022 hfb   Ready to quit: No Counseling given: Yes Tobacco comments: Smoking 2  cigarettes per week.  Trying to quit. 01/09/2022 hfb   Outpatient Medications Prior to Visit  Medication Sig Dispense Refill   albuterol (PROVENTIL HFA) 108 (90 Base) MCG/ACT inhaler Inhale 2 puffs into the lungs every 6 (six) hours as needed for wheezing or shortness of breath. 26.8 g 3   Blood Pressure Monitor KIT 1 kit by Does not apply route daily. 1 kit 0   cetirizine (ZYRTEC) 10 MG tablet Take 1 tablet (10 mg total) by mouth once daily. 30 tablet 2   fluticasone (FLONASE) 50 MCG/ACT nasal spray Spray 1 spray into both nostrils once daily. 16 g 2   fluticasone-salmeterol (ADVAIR DISKUS) 250-50 MCG/ACT AEPB Inhale 1 puff into the lungs in the morning and at bedtime. 60 each 1   losartan (COZAAR) 25 MG tablet TAKE ONE TABLET BY MOUTH ONCE EVERY DAY. 90 tablet 1   omeprazole (PRILOSEC) 20 MG capsule Take 1 capsule ($RemoveBe'20mg'aujmgkHYj$ ) by mouth once every day. 90 capsule 1   polyethylene glycol-electrolytes (NULYTELY) 420 g solution Prepare according to package instructions. Starting at 5:00 PM: Drink one 8 oz glass of mixture every 15 minutes until you finish half of the jug. Five hours prior to procedure, drink 8 oz glass of mixture every 15 minutes until it is all gone. Make sure you do not drink anything 4 hours prior to your procedure. 4000 mL 0   Roflumilast (DALIRESP) 250 MCG TABS Take 1 tablet by mouth once daily. [place]  tablet 0   umeclidinium bromide (INCRUSE ELLIPTA) 62.5 MCG/ACT AEPB INHALE ONE PUFF INTO THE LUNGS ONCE DAILY. 30 each 1   XIFAXAN 550 MG TABS tablet Take 1 tablet (550 mg total) by mouth 2 (two) times daily. 180 tablet 3   No facility-administered medications prior to visit.     Review of Systems:   Constitutional:   No  weight loss, night sweats,  Fevers, chills,  +fatigue, or  lassitude.  HEENT:   No headaches,  Difficulty swallowing,  Tooth/dental problems, or  Sore throat,                No sneezing, itching, ear ache, nasal congestion, post nasal drip,   CV:  No chest pain,   Orthopnea, PND, swelling in lower extremities, anasarca, dizziness, palpitations, syncope.   GI  No heartburn, indigestion, abdominal pain, nausea, vomiting, diarrhea, change in bowel habits, loss of appetite, bloody stools.   Resp:  No excess mucus, no productive cough,  No non-productive cough,  No coughing up of blood.  No change in color of mucus.  No wheezing.  No chest wall deformity  Skin: no rash or lesions.  GU: no dysuria, change in color of urine, no urgency or frequency.  No flank pain, no hematuria   MS:  No joint pain or swelling.  No decreased range of motion.  No back pain.    Physical Exam  BP 116/70 (BP Location: Right Arm, Patient Position: Sitting, Cuff Size: Large)   Pulse 67   Temp 98.2 F (36.8 C) (Oral)   Ht $R'5\' 4"'uv$  (1.626 m)   Wt 188 lb 12.8 oz (85.6 kg)   LMP 02/28/2014 (Approximate)   SpO2 98%   BMI 32.41 kg/m   GEN: A/Ox3; pleasant , NAD, well nourished    HEENT:  [place]/AT,   NOSE-clear, THROAT-clear, no lesions, no postnasal drip or exudate noted.   NECK:  Supple w/ fair ROM; no JVD; normal carotid impulses w/o bruits; no thyromegaly or nodules palpated; no lymphadenopathy.    RESP  Clear  P & A; w/o, wheezes/ rales/ or rhonchi. no accessory muscle use, no dullness to percussion  CARD:  RRR, no m/r/g, no peripheral edema, pulses intact, no cyanosis or clubbing.  GI:   Soft & nt; nml bowel sounds; no organomegaly or masses detected.   Musco: Warm bil, no deformities or joint swelling noted.   Neuro: alert, no focal deficits noted.    Skin: Warm, no lesions or rashes    Lab Results:  CBC     BNP  ProBNP No results found for: "PROBNP"  Imaging: No results found.        No data to display          No results found for: "NITRICOXIDE"      Assessment & Plan:   COPD (chronic obstructive pulmonary disease) ([place]) COPD with emphysema compensated on present regimen with Advair, Incruse and Daliresp.  Needs PFTs.  We will set  up PFTs for return visit.  Chest x-ray today.  Encouraged on smoking cessation.  Refer to the lung cancer screening program  Plan  Patient Instructions  Refer to Lung cancer screening CT chest program.  Work on not smoking .  Continue on Advair and Incruse Inhaler  Albuterol inhaler As needed   Continue on Daliresp daily .  Follow up Dr. Genia Harold in 3 months with PFT and As needed  . (New patient slot)    '  Tobacco use disorder  Encouraged on smoking cessation.  Referred to the lung cancer screening program     Rexene Edison, NP 01/09/2022

## 2022-02-05 ENCOUNTER — Other Ambulatory Visit: Payer: Self-pay | Admitting: *Deleted

## 2022-02-05 DIAGNOSIS — J441 Chronic obstructive pulmonary disease with (acute) exacerbation: Secondary | ICD-10-CM

## 2022-02-05 MED ORDER — ALBUTEROL SULFATE HFA 108 (90 BASE) MCG/ACT IN AERS: 2.0000 | INHALATION_SPRAY | Freq: Four times a day (QID) | RESPIRATORY_TRACT | 3 refills | Status: DC

## 2022-02-05 MED ORDER — ROFLUMILAST 250 MCG PO TABS: 1.0000 | ORAL_TABLET | Freq: Every day | ORAL | 2 refills | Status: DC

## 2022-02-06 ENCOUNTER — Other Ambulatory Visit (HOSPITAL_COMMUNITY): Payer: Self-pay

## 2022-02-07 ENCOUNTER — Other Ambulatory Visit (HOSPITAL_COMMUNITY): Payer: Self-pay

## 2022-02-13 ENCOUNTER — Other Ambulatory Visit (HOSPITAL_COMMUNITY): Payer: Self-pay

## 2022-02-13 ENCOUNTER — Telehealth: Payer: Self-pay

## 2022-02-13 NOTE — Telephone Encounter (Signed)
Received notification from [facility] regarding a prior authorization for Roflumilast. Authorization has been APPROVED from 02/13/2022 to 05/07/2023.   Per test claim, copay for 90 days supply is $0.00  Key: [id number] - PA Case ID: [id number]

## 2022-02-13 NOTE — Telephone Encounter (Signed)
PA request received through CoverMyMeds for Roflumilast tablets.   PA submitted and awaiting determination.   Key: [id number] - PA Case ID: [id number]

## 2022-02-16 ENCOUNTER — Other Ambulatory Visit: Payer: Self-pay

## 2022-02-16 DIAGNOSIS — F17210 Nicotine dependence, cigarettes, uncomplicated: Secondary | ICD-10-CM

## 2022-02-16 DIAGNOSIS — Z87891 Personal history of nicotine dependence: Secondary | ICD-10-CM

## 2022-02-16 DIAGNOSIS — Z122 Encounter for screening for malignant neoplasm of respiratory organs: Secondary | ICD-10-CM

## 2022-03-14 ENCOUNTER — Encounter: Payer: Self-pay | Admitting: Acute Care

## 2022-03-14 ENCOUNTER — Ambulatory Visit (INDEPENDENT_AMBULATORY_CARE_PROVIDER_SITE_OTHER): Payer: Medicare Other | Admitting: Acute Care

## 2022-03-14 DIAGNOSIS — F17210 Nicotine dependence, cigarettes, uncomplicated: Secondary | ICD-10-CM | POA: Diagnosis not present

## 2022-03-14 NOTE — Progress Notes (Signed)
Virtual Visit via Telephone Note  I connected with Monique Edwards on 03/14/22 at 11:00 AM EST by telephone and verified that I am speaking with the correct person using two identifiers.  Location: Patient:  At home Provider:  [address] [place], [place], [address]    I discussed the limitations, risks, security and privacy concerns of performing an evaluation and management service by telephone and the availability of in person appointments. I also discussed with the patient that there may be a patient responsible charge related to this service. The patient expressed understanding and agreed to proceed.    Shared Decision Making Visit Lung Cancer Screening Program [phone])   Eligibility: Age 56 y.o. Pack Years Smoking History Calculation 59 pack year smoking history (# packs/per year x # years smoked) Recent History of coughing up blood  no Unexplained weight loss? no ( >Than 15 pounds within the last 6 months ) Prior History Lung / other cancer no (Diagnosis within the last 5 years already requiring surveillance chest CT Scans). Smoking Status Current Smoker Former Smokers: Years since quit:  NA  Quit Date:  NA  Visit Components: Discussion included one or more decision making aids. yes Discussion included risk/benefits of screening. yes Discussion included potential follow up diagnostic testing for abnormal scans. yes Discussion included meaning and risk of over diagnosis. yes Discussion included meaning and risk of False Positives. yes Discussion included meaning of total radiation exposure. yes  Counseling Included: Importance of adherence to annual lung cancer LDCT screening. yes Impact of comorbidities on ability to participate in the program. yes Ability and willingness to under diagnostic treatment. yes  Smoking Cessation Counseling: Current Smokers:  Discussed importance of smoking cessation. yes Information about tobacco cessation classes and interventions  provided to patient. yes Patient provided with "ticket" for LDCT Scan. yes Symptomatic Patient. no  Counseling NA Diagnosis Code: Tobacco Use Z72.0 Asymptomatic Patient yes  Counseling (Intermediate counseling: > three minutes counseling) C6237 Former Smokers:  Discussed the importance of maintaining cigarette abstinence. yes Diagnosis Code: Personal History of Nicotine Dependence. S28.315 Information about tobacco cessation classes and interventions provided to patient. Yes Patient provided with "ticket" for LDCT Scan. yes Written Order for Lung Cancer Screening with LDCT placed in Epic. Yes (CT Chest Lung Cancer Screening Low Dose W/O CM) [id number] Z12.2-Screening of respiratory organs Z87.891-Personal history of nicotine dependence  I have spent 25 minutes of face to face/ virtual visit   time with  Monique Edwards discussing the risks and benefits of lung cancer screening. We viewed / discussed a power point together that explained in detail the above noted topics. We paused at intervals to allow for questions to be asked and answered to ensure understanding.We discussed that the single most powerful action that she can take to decrease her risk of developing lung cancer is to quit smoking. We discussed whether or not she is ready to commit to setting a quit date. We discussed options for tools to aid in quitting smoking including nicotine replacement therapy, non-nicotine medications, support groups, Quit Smart classes, and behavior modification. We discussed that often times setting smaller, more achievable goals, such as eliminating 1 cigarette a day for a week and then 2 cigarettes a day for a week can be helpful in slowly decreasing the number of cigarettes smoked. This allows for a sense of accomplishment as well as providing a clinical benefit. I provided  her  with smoking cessation  information  with contact information for community resources, classes,  free nicotine replacement therapy, and  access to mobile apps, text messaging, and on-line smoking cessation help. I have also provided  her  the office contact information in the event she needs to contact me, or the screening staff. We discussed the time and location of the scan, and that either Abigail Miyamoto RN, Karlton Lemon, RN  or I will call / send a letter with the results within 24-72 hours of receiving them. The patient verbalized understanding of all of  the above and had no further questions upon leaving the office. They have my contact information in the event they have any further questions.  I spent 3 minutes counseling on smoking cessation and the health risks of continued tobacco abuse.  I explained to the patient that there has been a high incidence of coronary artery disease noted on these exams. I explained that this is a non-gated exam therefore degree or severity cannot be determined. This patient is not on statin therapy. I have asked the patient to follow-up with their PCP regarding any incidental finding of coronary artery disease and management with diet or medication as their PCP  feels is clinically indicated. The patient verbalized understanding of the above and had no further questions upon completion of the visit.      Bevelyn Ngo, NP 03/14/2022

## 2022-03-14 NOTE — Patient Instructions (Signed)
Thank you for participating in the [facility]. It was our pleasure to meet you today. We will call you with the results of your scan within the next few days. Your scan will be assigned a Lung RADS category score by the physicians reading the scans.  This Lung RADS score determines follow up scanning.  See below for description of categories, and follow up screening recommendations. We will be in touch to schedule your follow up screening annually or based on recommendations of our providers. We will fax a copy of your scan results to your Primary Care Physician, or the physician who referred you to the program, to ensure they have the results. Please call the office if you have any questions or concerns regarding your scanning experience or results.  Our office number is [phone]. Please speak with Denise Phelps, RN. , or  Denise Buckner RN, They are  our Lung Cancer Screening RN.'s If They are unavailable when you call, Please leave a message on the voice mail. We will return your call at our earliest convenience.This voice mail is monitored several times a day.  Remember, if your scan is normal, we will scan you annually as long as you continue to meet the criteria for the program. (Age 55-77, Current smoker or smoker who has quit within the last 15 years). If you are a smoker, remember, quitting is the single most powerful action that you can take to decrease your risk of lung cancer and other pulmonary, breathing related problems. We know quitting is hard, and we are here to help.  Please let us know if there is anything we can do to help you meet your goal of quitting. If you are a former smoker, congratulations. We are proud of you! Remain smoke free! Remember you can refer friends or family members through the number above.  We will screen them to make sure they meet criteria for the program. Thank you for helping us take better care of you by  participating in Lung Screening.  You can receive free nicotine replacement therapy ( patches, gum or mints) by calling 1-800-QUIT NOW. Please call so we can get you on the path to becoming  a non-smoker. I know it is hard, but you can do this!  Lung RADS Categories:  Lung RADS 1: no nodules or definitely non-concerning nodules.  Recommendation is for a repeat annual scan in 12 months.  Lung RADS 2:  nodules that are non-concerning in appearance and behavior with a very low likelihood of becoming an active cancer. Recommendation is for a repeat annual scan in 12 months.  Lung RADS 3: nodules that are probably non-concerning , includes nodules with a low likelihood of becoming an active cancer.  Recommendation is for a 6-month repeat screening scan. Often noted after an upper respiratory illness. We will be in touch to make sure you have no questions, and to schedule your 6-month scan.  Lung RADS 4 A: nodules with concerning findings, recommendation is most often for a follow up scan in 3 months or additional testing based on our provider's assessment of the scan. We will be in touch to make sure you have no questions and to schedule the recommended 3 month follow up scan.  Lung RADS 4 B:  indicates findings that are concerning. We will be in touch with you to schedule additional diagnostic testing based on our provider's  assessment of the scan.  Other options for assistance in smoking cessation (   As covered by your insurance benefits)  Hypnosis for smoking cessation  [employer] [phone]  Acupuncture for smoking cessation  [facility] [phone]   

## 2022-03-16 ENCOUNTER — Telehealth: Payer: Self-pay | Admitting: Acute Care

## 2022-03-16 ENCOUNTER — Ambulatory Visit: Admission: RE | Admit: 2022-03-16 | Payer: Medicare Other | Source: Ambulatory Visit

## 2022-03-16 NOTE — Telephone Encounter (Signed)
Spoke with pt and rescheduled lung screening CT for 03/21/22. Pt verbalized understanding.

## 2022-03-16 NOTE — Telephone Encounter (Signed)
Pt was scheduled for a lung cancer screen CT today 11/10 at 9:20 but pt called the office stating that she was having car trouble and is needing to get this rescheduled. Routing to lung nodule pool. Please advise.

## 2022-03-21 ENCOUNTER — Ambulatory Visit: Admission: RE | Admit: 2022-03-21 | Payer: Medicare Other | Source: Ambulatory Visit

## 2022-03-22 ENCOUNTER — Ambulatory Visit: Payer: Medicare Other | Attending: Adult Health

## 2022-03-22 DIAGNOSIS — J449 Chronic obstructive pulmonary disease, unspecified: Secondary | ICD-10-CM | POA: Diagnosis not present

## 2022-03-22 DIAGNOSIS — Z8709 Personal history of other diseases of the respiratory system: Secondary | ICD-10-CM

## 2022-03-22 LAB — PULMONARY FUNCTION TEST ARMC ONLY
DL/VA % pred: 98 %
DL/VA: 4.29 ml/min/mmHg/L
DLCO unc % pred: 97 %
DLCO unc: 17.37 ml/min/mmHg
FEF 25-75 Post: 2.25 L/sec
FEF 25-75 Pre: 1.88 L/sec
FEF2575-%Change-Post: 19 %
FEF2575-%Pred-Post: 99 %
FEF2575-%Pred-Pre: 83 %
FEV1-%Change-Post: 6 %
FEV1-%Pred-Post: 85 %
FEV1-%Pred-Pre: 80 %
FEV1-Post: 1.94 L
FEV1-Pre: 1.83 L
FEV1FVC-%Change-Post: 2 %
FEV1FVC-%Pred-Pre: 102 %
FEV6-%Change-Post: 3 %
FEV6-%Pred-Post: 83 %
FEV6-%Pred-Pre: 80 %
FEV6-Post: 2.36 L
FEV6-Pre: 2.28 L
FEV6FVC-%Pred-Post: 103 %
FEV6FVC-%Pred-Pre: 103 %
FVC-%Change-Post: 3 %
FVC-%Pred-Post: 80 %
FVC-%Pred-Pre: 78 %
FVC-Post: 2.36 L
FVC-Pre: 2.28 L
Post FEV1/FVC ratio: 82 %
Post FEV6/FVC ratio: 100 %
Pre FEV1/FVC ratio: 80 %
Pre FEV6/FVC Ratio: 100 %
RV % pred: 78 %
RV: 1.36 L
TLC % pred: 81 %
TLC: 3.64 L

## 2022-03-22 MED ORDER — ALBUTEROL SULFATE (2.5 MG/3ML) 0.083% IN NEBU
2.5000 mg | INHALATION_SOLUTION | Freq: Once | RESPIRATORY_TRACT | Status: AC
  Administered 2022-03-22: 2.5 mg via RESPIRATORY_TRACT

## 2022-03-28 ENCOUNTER — Other Ambulatory Visit: Payer: Self-pay

## 2022-03-28 ENCOUNTER — Ambulatory Visit: Admission: RE | Admit: 2022-03-28 | Payer: Medicare Other | Source: Ambulatory Visit

## 2022-04-02 ENCOUNTER — Ambulatory Visit: Payer: Medicaid Other | Admitting: Gastroenterology

## 2022-04-10 ENCOUNTER — Ambulatory Visit: Payer: Medicare Other | Admitting: Student in an Organized Health Care Education/Training Program

## 2022-04-20 ENCOUNTER — Ambulatory Visit: Payer: Medicare Other | Attending: Acute Care

## 2022-05-17 ENCOUNTER — Ambulatory Visit: Payer: Medicaid Other | Admitting: Student in an Organized Health Care Education/Training Program

## 2022-05-22 ENCOUNTER — Other Ambulatory Visit: Payer: Self-pay | Admitting: Pulmonary Disease

## 2022-05-22 DIAGNOSIS — Z8709 Personal history of other diseases of the respiratory system: Secondary | ICD-10-CM

## 2022-05-24 DIAGNOSIS — Z13220 Encounter for screening for lipoid disorders: Secondary | ICD-10-CM | POA: Diagnosis not present

## 2022-05-24 DIAGNOSIS — Z0000 Encounter for general adult medical examination without abnormal findings: Secondary | ICD-10-CM | POA: Diagnosis not present

## 2022-05-24 DIAGNOSIS — Z1389 Encounter for screening for other disorder: Secondary | ICD-10-CM | POA: Diagnosis not present

## 2022-05-24 DIAGNOSIS — Z1159 Encounter for screening for other viral diseases: Secondary | ICD-10-CM | POA: Diagnosis not present

## 2022-05-24 DIAGNOSIS — W19XXXA Unspecified fall, initial encounter: Secondary | ICD-10-CM | POA: Diagnosis not present

## 2022-05-24 DIAGNOSIS — Z131 Encounter for screening for diabetes mellitus: Secondary | ICD-10-CM | POA: Diagnosis not present

## 2022-05-25 DIAGNOSIS — S40011A Contusion of right shoulder, initial encounter: Secondary | ICD-10-CM | POA: Diagnosis not present

## 2022-05-28 ENCOUNTER — Ambulatory Visit: Payer: Medicaid Other | Admitting: Student in an Organized Health Care Education/Training Program

## 2022-06-15 ENCOUNTER — Ambulatory Visit: Payer: Medicaid Other | Admitting: Student in an Organized Health Care Education/Training Program

## 2022-07-03 ENCOUNTER — Ambulatory Visit: Payer: Medicaid Other | Admitting: Student in an Organized Health Care Education/Training Program

## 2022-07-13 ENCOUNTER — Other Ambulatory Visit: Payer: Self-pay | Admitting: Pulmonary Disease

## 2022-07-13 ENCOUNTER — Other Ambulatory Visit: Payer: Self-pay | Admitting: Adult Health

## 2022-07-13 DIAGNOSIS — Z8709 Personal history of other diseases of the respiratory system: Secondary | ICD-10-CM

## 2022-07-17 ENCOUNTER — Other Ambulatory Visit: Payer: Self-pay | Admitting: Gastroenterology

## 2022-07-21 ENCOUNTER — Other Ambulatory Visit: Payer: Self-pay | Admitting: Adult Health

## 2022-07-21 DIAGNOSIS — J441 Chronic obstructive pulmonary disease with (acute) exacerbation: Secondary | ICD-10-CM

## 2022-08-02 ENCOUNTER — Other Ambulatory Visit: Payer: Self-pay

## 2022-08-02 ENCOUNTER — Encounter: Payer: Self-pay | Admitting: Gastroenterology

## 2022-08-02 ENCOUNTER — Ambulatory Visit: Payer: Medicaid Other | Admitting: Gastroenterology

## 2022-08-14 ENCOUNTER — Ambulatory Visit (INDEPENDENT_AMBULATORY_CARE_PROVIDER_SITE_OTHER): Payer: 59 | Admitting: Student in an Organized Health Care Education/Training Program

## 2022-08-14 ENCOUNTER — Encounter: Payer: Self-pay | Admitting: Student in an Organized Health Care Education/Training Program

## 2022-08-14 VITALS — BP 128/78 | HR 61 | Temp 98.0°F | Ht 64.0 in | Wt 191.6 lb

## 2022-08-14 DIAGNOSIS — R0602 Shortness of breath: Secondary | ICD-10-CM

## 2022-08-14 DIAGNOSIS — Z8709 Personal history of other diseases of the respiratory system: Secondary | ICD-10-CM | POA: Diagnosis not present

## 2022-08-14 DIAGNOSIS — D696 Thrombocytopenia, unspecified: Secondary | ICD-10-CM

## 2022-08-14 LAB — NITRIC OXIDE: Nitric Oxide: 8

## 2022-08-14 MED ORDER — ADVAIR DISKUS 250-50 MCG/ACT IN AEPB
1.0000 | INHALATION_SPRAY | Freq: Two times a day (BID) | RESPIRATORY_TRACT | 12 refills | Status: DC
Start: 2022-08-14 — End: 2023-06-20

## 2022-08-14 MED ORDER — ALBUTEROL SULFATE HFA 108 (90 BASE) MCG/ACT IN AERS
2.0000 | INHALATION_SPRAY | Freq: Four times a day (QID) | RESPIRATORY_TRACT | 11 refills | Status: AC
Start: 2022-08-14 — End: ?

## 2022-08-14 MED ORDER — INCRUSE ELLIPTA 62.5 MCG/ACT IN AEPB
1.0000 | INHALATION_SPRAY | Freq: Every day | RESPIRATORY_TRACT | 12 refills | Status: DC
Start: 2022-08-14 — End: 2023-09-24

## 2022-08-14 NOTE — Progress Notes (Signed)
Assessment & Plan:   1. Shortness of breath 2. Reactive Airway Disease  Presents for follow up today, symptoms appear to be controlled with LABA/LAMA/ICS combination. She has had PFT's that only show minimal obstruction, and are not consistent with COPD.Given her PFT's are overall normal, and out of proportion to reported symptoms, I am more concerned for reactive airway disease like asthma rather than COPD. She is currently well controlled with Advair. Will continue her on triple therapy for the time being. FENO in clinic today was at 9. Patient was counseled extensively on the need for smoking cessation.  She does report chronic cough productive of grey and black sputum. She's never had a chest CT in the past. I will order a chest CT today to evaluate for any endobronchial lesions, bronchiectasis, and ILD.  - Nitric oxide -chest CT -continue triple therapy  2. Thrombocytopenia  Could be secondary to her liver disease but this was not worked up.  Will refer to hematology.  - Ambulatory referral to Hematology / Oncology   Return in about 6 months (around 02/13/2023).  I spent 30 minutes caring for this patient today, including preparing to see the patient, obtaining a medical history , reviewing a separately obtained history, performing a medically appropriate examination and/or evaluation, counseling and educating the patient/family/caregiver, ordering medications, tests, or procedures, documenting clinical information in the electronic health record, and independently interpreting results (not separately reported/billed) and communicating results to the patient/family/caregiver  Raechel Chute, MD [facility] 08/14/2022 3:05 PM    End of visit medications:  No orders of the defined types were placed in this encounter.    Current Outpatient Medications:    ADVAIR DISKUS 250-50 MCG/ACT AEPB, INHALE 1 DOSE BY MOUTH IN THE MORNING AND AT BEDTIME, Disp: 60 each,  Rfl: 0   albuterol (PROVENTIL HFA) 108 (90 Base) MCG/ACT inhaler, Inhale 2 puffs into the lungs every 6 (six) hours as needed for wheezing or shortness of breath., Disp: 24 g, Rfl: 3   Blood Pressure Monitor KIT, 1 kit by Does not apply route daily., Disp: 1 kit, Rfl: 0   cetirizine (ZYRTEC) 10 MG tablet, Take 1 tablet (10 mg total) by mouth once daily., Disp: 30 tablet, Rfl: 2   citalopram (CELEXA) 10 MG tablet, Take 10 mg by mouth daily., Disp: , Rfl:    citalopram (CELEXA) 20 MG tablet, Take 20 mg by mouth daily., Disp: , Rfl:    cyclobenzaprine (FLEXERIL) 10 MG tablet, Take 10 mg by mouth at bedtime as needed., Disp: , Rfl:    fluticasone (FLONASE) 50 MCG/ACT nasal spray, Spray 1 spray into both nostrils once daily., Disp: 16 g, Rfl: 2   ibuprofen (ADVIL) 800 MG tablet, Take 800 mg by mouth 3 (three) times daily., Disp: , Rfl:    polyethylene glycol-electrolytes (NULYTELY) 420 g solution, Prepare according to package instructions. Starting at 5:00 PM: Drink one 8 oz glass of mixture every 15 minutes until you finish half of the jug. Five hours prior to procedure, drink 8 oz glass of mixture every 15 minutes until it is all gone. Make sure you do not drink anything 4 hours prior to your procedure., Disp: 4000 mL, Rfl: 0   rifaximin (XIFAXAN) 550 MG TABS tablet, Take 1 tablet (550 mg total) by mouth 2 (two) times daily., Disp: 180 tablet, Rfl: 3   Roflumilast 250 MCG TABS, Take 1 tablet by mouth once daily, Disp: 30 tablet, Rfl: 2   umeclidinium bromide (  INCRUSE ELLIPTA) 62.5 MCG/ACT AEPB, Inhale 1 puff by mouth once daily, Disp: 90 each, Rfl: 1   losartan (COZAAR) 25 MG tablet, TAKE ONE TABLET BY MOUTH ONCE EVERY DAY., Disp: 90 tablet, Rfl: 1   omeprazole (PRILOSEC) 20 MG capsule, Take 1 capsule (20mg ) by mouth once every day., Disp: 90 capsule, Rfl: 1   Subjective:   PATIENT ID: Monique Edwards GENDER: female DOB: 04-May-1966, MRN: [id number]  Chief Complaint  Patient presents with   Follow-up     SOB with exertion, prod cough with black sputum and wheezing.     HPI  Patient is a 57 year old female, active smoker, presenting to clinic for follow up.  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 Nicholos Johns, and most recently by Ames Dura, NP and Rubye Oaks, NP.  Patient is presenting today for follow up. Her symptoms have been stable. She's had shortness of breath for 4 to 5 years now. On further questioning, her symptoms were probably earlier than that. She is maintained on triple therapy (Advair + Incruse) in addition to [employer]. She continues to experience exertional dyspnea, and has a cough productive of sputum. She reports occasional wheezing.  Patient had PFTs recently (results appear to be normal with signs of minimal obstruction on flow volume loop) and is compliant with her medications.  She is continuing to smoke, up to 7 cigarettes a day.  She denies any other inhalational exposures.  She reports that she has a liver doctor for which she follows for fatty liver disease.  She is noted to have thrombocytopenia on blood work but she is not aware of that and has not seen anybody.  Started smoking around 57 yo. 1 PPD at heaviest. Denies other inhalational exposure, denies drug use, and denies vaping. Disabled due to arthritis and COPD.  Ancillary information including prior medications, full medical/surgical/family/social histories, and PFTs (when available) are listed below and have been reviewed.   Review of Systems  Constitutional:  Negative for chills, fever and weight loss.  Respiratory:  Positive for cough, sputum production, shortness of breath and wheezing.   Cardiovascular:  Negative for chest pain.  Skin:  Negative for rash.     Objective:   Vitals:   08/14/22 1441  BP: 128/78  Pulse: 61  Temp: 98 F (36.7 C)  TempSrc: Temporal  SpO2: 98%  Weight: 191 lb 9.6 oz (86.9 kg)  Height: 5\' 4"  (1.626  m)   98% on RA BMI Readings from Last 3 Encounters:  08/14/22 32.89 kg/m  01/09/22 32.41 kg/m  06/07/21 33.09 kg/m   Wt Readings from Last 3 Encounters:  08/14/22 191 lb 9.6 oz (86.9 kg)  01/09/22 188 lb 12.8 oz (85.6 kg)  06/07/21 186 lb 12.8 oz (84.7 kg)    Physical Exam Constitutional:      Appearance: She is obese. She is not ill-appearing.  HENT:     Mouth/Throat:     Mouth: Mucous membranes are moist.  Cardiovascular:     Rate and Rhythm: Normal rate and regular rhythm.     Pulses: Normal pulses.     Heart sounds: Normal heart sounds.  Pulmonary:     Effort: Pulmonary effort is normal.     Breath sounds: Normal breath sounds. No wheezing, rhonchi or rales.  Abdominal:     Palpations: Abdomen is soft.  Musculoskeletal:     Right lower leg: No edema.     Left lower leg:  No edema.  Neurological:     General: No focal deficit present.     Mental Status: She is alert and oriented to person, place, and time. Mental status is at baseline.     Ancillary Information    Past Medical History:  Diagnosis Date   COPD (chronic obstructive pulmonary disease)    Fatty liver    GERD (gastroesophageal reflux disease)    Hypertension      Family History  Problem Relation Age of Onset   Hypertension Mother    Stroke Mother    Diabetes Father    Arthritis Sister    Thyroid disease Sister    Hypertension Brother    Hypertension Brother      Past Surgical History:  Procedure Laterality Date   CHOLECYSTECTOMY     ESOPHAGOGASTRODUODENOSCOPY (EGD) WITH PROPOFOL N/A 06/06/2018   Procedure: ESOPHAGOGASTRODUODENOSCOPY (EGD) WITH PROPOFOL;  Surgeon: Wyline MoodAnna, Kiran, MD;  [facility] END[facility]COPY;  Service: Gastroenterology;  Laterality: N/A;    Social History   Socioeconomic History   Marital status: Widowed    Spouse name: Not on file   Number of children: Not on file   Years of education: Not on file   Highest education level: Not on file  Occupational History    Occupation: Unemployed  Tobacco Use   Smoking status: Some Days    Years: 16    Types: Cigarettes    Start date: 11/04/1980   Smokeless tobacco: Never   Tobacco comments:    1 pack per week--08/14/2022  Vaping Use   Vaping Use: Never used  Substance and Sexual Activity   Alcohol use: Yes    Comment: 2-3 beers every few weeks   Drug use: Not Currently    Types: Cocaine    Comment: last use 2021   Sexual activity: Yes    Partners: Male  Other Topics Concern   Not on file  Social History Narrative   Not on file   Social Determinants of Health   Financial Resource Strain: Not on file  Food Insecurity: No Food Insecurity (10/06/2020)   Hunger Vital Sign    Worried About Running Out of Food in the Last Year: Never true    Ran Out of Food in the Last Year: Never true  Transportation Needs: No Transportation Needs (10/06/2020)   PRAPARE - Administrator, Civil ServiceTransportation    Lack of Transportation (Medical): No    Lack of Transportation (Non-Medical): No  Physical Activity: Not on file  Stress: Not on file  Social Connections: Not on file  Intimate Partner Violence: Not on file     Allergies  Allergen Reactions   Amlodipine Swelling    Peripheral edema   Lisinopril Cough     CBC    Component Value Date/Time   WBC 3.9 03/08/2021 1455   WBC 5.4 07/04/2019 0652   RBC 4.35 03/08/2021 1455   RBC 5.00 07/04/2019 0652   HGB 13.3 03/08/2021 1455   HCT 39.8 03/08/2021 1455   PLT 81 (LL) 03/08/2021 1455   MCV 92 03/08/2021 1455   MCH 30.6 03/08/2021 1455   MCH 30.2 07/04/2019 0652   MCHC 33.4 03/08/2021 1455   MCHC 33.3 07/04/2019 0652   RDW 12.4 03/08/2021 1455   LYMPHSABS 1.0 04/12/2020 1534   MONOABS 0.4 07/04/2019 0652   EOSABS 0.1 04/12/2020 1534   BASOSABS 0.0 04/12/2020 1534    Pulmonary Functions Testing Results:    Latest Ref Rng & Units 03/22/2022    4:07 PM  PFT  Results  FVC-Pre L 2.28   FVC-Predicted Pre % 78   FVC-Post L 2.36   FVC-Predicted Post % 80   Pre FEV1/FVC % %  80   Post FEV1/FCV % % 82   FEV1-Pre L 1.83   FEV1-Predicted Pre % 80   FEV1-Post L 1.94   DLCO uncorrected ml/min/mmHg 17.37   DLCO UNC% % 97   DLVA Predicted % 98   TLC L 3.64   TLC % Predicted % 81   RV % Predicted % 78     Outpatient Medications Prior to Visit  Medication Sig Dispense Refill   ADVAIR DISKUS 250-50 MCG/ACT AEPB INHALE 1 DOSE BY MOUTH IN THE MORNING AND AT BEDTIME 60 each 0   albuterol (PROVENTIL HFA) 108 (90 Base) MCG/ACT inhaler Inhale 2 puffs into the lungs every 6 (six) hours as needed for wheezing or shortness of breath. 24 g 3   Blood Pressure Monitor KIT 1 kit by Does not apply route daily. 1 kit 0   cetirizine (ZYRTEC) 10 MG tablet Take 1 tablet (10 mg total) by mouth once daily. 30 tablet 2   citalopram (CELEXA) 10 MG tablet Take 10 mg by mouth daily.     citalopram (CELEXA) 20 MG tablet Take 20 mg by mouth daily.     cyclobenzaprine (FLEXERIL) 10 MG tablet Take 10 mg by mouth at bedtime as needed.     fluticasone (FLONASE) 50 MCG/ACT nasal spray Spray 1 spray into both nostrils once daily. 16 g 2   ibuprofen (ADVIL) 800 MG tablet Take 800 mg by mouth 3 (three) times daily.     polyethylene glycol-electrolytes (NULYTELY) 420 g solution Prepare according to package instructions. Starting at 5:00 PM: Drink one 8 oz glass of mixture every 15 minutes until you finish half of the jug. Five hours prior to procedure, drink 8 oz glass of mixture every 15 minutes until it is all gone. Make sure you do not drink anything 4 hours prior to your procedure. 4000 mL 0   rifaximin (XIFAXAN) 550 MG TABS tablet Take 1 tablet (550 mg total) by mouth 2 (two) times daily. 180 tablet 3   Roflumilast 250 MCG TABS Take 1 tablet by mouth once daily 30 tablet 2   umeclidinium bromide (INCRUSE ELLIPTA) 62.5 MCG/ACT AEPB Inhale 1 puff by mouth once daily 90 each 1   losartan (COZAAR) 25 MG tablet TAKE ONE TABLET BY MOUTH ONCE EVERY DAY. 90 tablet 1   omeprazole (PRILOSEC) 20 MG capsule  Take 1 capsule (20mg ) by mouth once every day. 90 capsule 1   No facility-administered medications prior to visit.

## 2022-08-20 ENCOUNTER — Inpatient Hospital Stay: Payer: Medicaid Other | Admitting: Internal Medicine

## 2022-08-20 ENCOUNTER — Inpatient Hospital Stay: Payer: Medicaid Other

## 2022-08-24 ENCOUNTER — Telehealth: Payer: Self-pay | Admitting: Student in an Organized Health Care Education/Training Program

## 2022-08-24 NOTE — Telephone Encounter (Signed)
When I called the patient to schedule her CT she stated that she is very claustrophobic and will need something to relax her before doing the CT. Right now we have her scheduled on 08/30/22 @ 4:30pm. Dr. Aundria Rud would you be willing to send in Rx

## 2022-08-27 ENCOUNTER — Encounter: Payer: Self-pay | Admitting: Internal Medicine

## 2022-08-27 ENCOUNTER — Inpatient Hospital Stay: Payer: 59 | Attending: Internal Medicine | Admitting: Internal Medicine

## 2022-08-27 ENCOUNTER — Inpatient Hospital Stay: Payer: 59

## 2022-08-27 VITALS — BP 132/71 | HR 63 | Temp 98.6°F | Ht 64.0 in

## 2022-08-27 DIAGNOSIS — K7460 Unspecified cirrhosis of liver: Secondary | ICD-10-CM | POA: Insufficient documentation

## 2022-08-27 DIAGNOSIS — Z8619 Personal history of other infectious and parasitic diseases: Secondary | ICD-10-CM | POA: Diagnosis not present

## 2022-08-27 DIAGNOSIS — Z79899 Other long term (current) drug therapy: Secondary | ICD-10-CM | POA: Diagnosis not present

## 2022-08-27 DIAGNOSIS — F17210 Nicotine dependence, cigarettes, uncomplicated: Secondary | ICD-10-CM | POA: Insufficient documentation

## 2022-08-27 DIAGNOSIS — D696 Thrombocytopenia, unspecified: Secondary | ICD-10-CM

## 2022-08-27 LAB — COMPREHENSIVE METABOLIC PANEL
ALT: 17 U/L (ref 0–44)
AST: 22 U/L (ref 15–41)
Albumin: 3.9 g/dL (ref 3.5–5.0)
Alkaline Phosphatase: 58 U/L (ref 38–126)
Anion gap: 10 (ref 5–15)
BUN: 15 mg/dL (ref 6–20)
CO2: 23 mmol/L (ref 22–32)
Calcium: 9 mg/dL (ref 8.9–10.3)
Chloride: 106 mmol/L (ref 98–111)
Creatinine, Ser: 0.89 mg/dL (ref 0.44–1.00)
GFR, Estimated: >60 mL/min (ref >60)
Glucose, Bld: 106 mg/dL — ABNORMAL HIGH (ref 70–99)
Potassium: 3.4 mmol/L — ABNORMAL LOW (ref 3.5–5.1)
Sodium: 139 mmol/L (ref 135–145)
Total Bilirubin: 0.5 mg/dL (ref 0.3–1.2)
Total Protein: 7.3 g/dL (ref 6.5–8.1)

## 2022-08-27 LAB — CBC WITH DIFFERENTIAL/PLATELET
Abs Immature Granulocytes: 0.01 10*3/uL (ref 0.00–0.07)
Basophils Absolute: 0 10*3/uL (ref 0.0–0.1)
Basophils Relative: 1 %
Eosinophils Absolute: 0.1 10*3/uL (ref 0.0–0.5)
Eosinophils Relative: 1 %
HCT: 36.7 % (ref 36.0–46.0)
Hemoglobin: 12.4 g/dL (ref 12.0–15.0)
Immature Granulocytes: 0 %
Lymphocytes Relative: 27 %
Lymphs Abs: 1.2 10*3/uL (ref 0.7–4.0)
MCH: 30.1 pg (ref 26.0–34.0)
MCHC: 33.8 g/dL (ref 30.0–36.0)
MCV: 89.1 fL (ref 80.0–100.0)
Monocytes Absolute: 0.3 10*3/uL (ref 0.1–1.0)
Monocytes Relative: 7 %
Neutro Abs: 2.8 10*3/uL (ref 1.7–7.7)
Neutrophils Relative %: 64 %
Platelets: 83 10*3/uL — ABNORMAL LOW (ref 150–400)
RBC: 4.12 MIL/uL (ref 3.87–5.11)
RDW: 13.2 % (ref 11.5–15.5)
WBC: 4.4 10*3/uL (ref 4.0–10.5)
nRBC: 0 % (ref 0.0–0.2)

## 2022-08-27 LAB — IMMATURE PLATELET FRACTION: Immature Platelet Fraction: 11.7 % — ABNORMAL HIGH (ref 1.2–8.6)

## 2022-08-27 LAB — PROTIME-INR
INR: 1.1 (ref 0.8–1.2)
Prothrombin Time: 14 seconds (ref 11.4–15.2)

## 2022-08-27 LAB — HEPATITIS C ANTIBODY: HCV Ab: REACTIVE — AB

## 2022-08-27 LAB — TECHNOLOGIST SMEAR REVIEW: Plt Morphology: DECREASED

## 2022-08-27 LAB — HIV ANTIBODY (ROUTINE TESTING W REFLEX): HIV Screen 4th Generation wRfx: NONREACTIVE

## 2022-08-27 LAB — FOLATE: Folate: 19.7 ng/mL (ref >5.9)

## 2022-08-27 LAB — VITAMIN B12: Vitamin B-12: 207 pg/mL (ref 180–914)

## 2022-08-27 LAB — APTT: aPTT: 28 seconds (ref 24–36)

## 2022-08-27 LAB — HEPATITIS B SURFACE ANTIGEN: Hepatitis B Surface Ag: NONREACTIVE

## 2022-08-27 LAB — HEPATITIS B CORE ANTIBODY, IGM: Hep B C IgM: NONREACTIVE

## 2022-08-27 NOTE — Assessment & Plan Note (Addendum)
#   Thrombocytopenia: Moderate [50-80,000; no anemia/leukopenia] the etiology is likely secondary to liver disease/cirrhosis.  # I had a long discussion with patient regarding multiple etiologies including but not limited to liver disease/medications/autoimmune disease-ITP etc.  Less likely any malignant causes.  I would recommend holding off bone marrow biopsy at this time.   # History of hepatitis C/cirrhosis [Dr.Anna]-EGD 2020; recommended repeat due EGD in 2023.  Given Dr. Johnney Killian number for follow-up.  Also reminded the importance of surveillance imaging.  Also recommend alcohol cessation.  Recommend further work-up including-CBC CMP LDH; review of peripheral smear; HIV/hepatitis work-up.  Ultrasound abdomen-to rule out cirrhosis/splenomegaly.  # Airway reactive disease- [Dr.Dgyali]  Thank you Dr. Moshe Salisbury for allowing me to participate in the care of your pleasant patient. Please do not hesitate to contact me with questions or concerns in the interim.  # DISPOSITION: # Dr.Anna's office number - # labs today- CBC CMP LDH; review of peripheral smear;PT/PTT- HIV/hepatitis work-up;AFP- US abdomen # follow up in 2-3 weeks- MD; no labs- Dr.B  ...............................Marland Kitchen

## 2022-08-27 NOTE — Progress Notes (Signed)
[facility] CONSULT NOTE  Patient Care Team: [facility], [facility] as PCP - General Jim Like, RN as Registered Nurse Scarlett Presto, RN (Inactive) as Registered Nurse  CHIEF COMPLAINTS/PURPOSE OF CONSULTATION: Thrombocytopenia  # Chronic thrombocytopenia  # cirrhosis / Hep C-  Dr. Tobi Bastos in 2020- EGD.  HISTORY OF PRESENTING ILLNESS: Patient ambulating-independently. Accompanied by family.   Monique Edwards 58 y.o.  female with prior history of hepatitis C/cirrhosis chronic thrombocytopenia referred to [place] for further evaluation of thrombocytopenia.  Patient denies any prior history of thrombocytopenia.  However patient noted to have chronic thrombocytopenia for many years.  Patient stated she has fatty liver denied any cirrhosis history.  However, had further evaluation with Dr. Tobi Bastos in 2020.  Easy bruising/bleeding: chronic   Medications: no anticoagulation.   Alcohol: once 2-3 times a week.   Antiplatelet therapy/blood thinners: none  Review of Systems  Constitutional:  Positive for malaise/fatigue. Negative for chills, diaphoresis, fever and weight loss.  HENT:  Negative for nosebleeds and sore throat.   Eyes:  Negative for double vision.  Respiratory:  Negative for cough, hemoptysis, sputum production, shortness of breath and wheezing.   Cardiovascular:  Negative for chest pain, palpitations, orthopnea and leg swelling.  Gastrointestinal:  Negative for abdominal pain, blood in stool, constipation, diarrhea, heartburn, melena, nausea and vomiting.  Genitourinary:  Negative for dysuria, frequency and urgency.  Musculoskeletal:  Negative for back pain and joint pain.  Skin: Negative.  Negative for itching and rash.  Neurological:  Negative for dizziness, tingling, focal weakness, weakness and headaches.  Endo/Heme/Allergies:  Bruises/bleeds easily.  Psychiatric/Behavioral:  Negative for depression. The patient is not nervous/anxious and does  not have insomnia.      MEDICAL HISTORY:  Past Medical History:  Diagnosis Date   Arthritis    COPD (chronic obstructive pulmonary disease)    Fatty liver    GERD (gastroesophageal reflux disease)    Hypertension     SURGICAL HISTORY: Past Surgical History:  Procedure Laterality Date   CHOLECYSTECTOMY     ESOPHAGOGASTRODUODENOSCOPY (EGD) WITH PROPOFOL N/A 06/06/2018   Procedure: ESOPHAGOGASTRODUODENOSCOPY (EGD) WITH PROPOFOL;  Surgeon: Wyline Mood, MD;  Location: [facility] ENDOSCOPY;  Service: Gastroenterology;  Laterality: N/A;    SOCIAL HISTORY: Social History   Socioeconomic History   Marital status: Widowed    Spouse name: Not on file   Number of children: Not on file   Years of education: Not on file   Highest education level: Not on file  Occupational History   Occupation: Unemployed  Tobacco Use   Smoking status: Some Days    Years: 16    Types: Cigarettes    Start date: 11/04/1980   Smokeless tobacco: Never   Tobacco comments:    1 pack per week--08/14/2022  Vaping Use   Vaping Use: Never used  Substance and Sexual Activity   Alcohol use: Yes    Alcohol/week: 5.0 standard drinks of alcohol    Types: 5 Cans of beer per week    Comment: 2-3 beers every few weeks   Drug use: Not Currently    Types: Cocaine    Comment: last use 2021   Sexual activity: Yes    Partners: Male  Other Topics Concern   Not on file  Social History Narrative   Not on file   Social Determinants of Health   Financial Resource Strain: Not on file  Food Insecurity: No Food Insecurity (08/27/2022)   Hunger Vital Sign  Worried About Programme researcher, broadcasting/film/video in the Last Year: Never true    Ran Out of Food in the Last Year: Never true  Transportation Needs: No Transportation Needs (08/27/2022)   PRAPARE - Administrator, Civil Service (Medical): No    Lack of Transportation (Non-Medical): No  Physical Activity: Not on file  Stress: Not on file  Social Connections: Not on file   Intimate Partner Violence: Not on file    FAMILY HISTORY: Family History  Problem Relation Age of Onset   Hypertension Mother    Stroke Mother    Diabetes Father    Arthritis Sister    Thyroid disease Sister    Hypertension Brother    Hypertension Brother     ALLERGIES:  is allergic to amlodipine and lisinopril.  MEDICATIONS:  Current Outpatient Medications  Medication Sig Dispense Refill   ADVAIR DISKUS 250-50 MCG/ACT AEPB Inhale 1 puff into the lungs in the morning and at bedtime. 60 each 12   albuterol (PROVENTIL HFA) 108 (90 Base) MCG/ACT inhaler Inhale 2 puffs into the lungs every 6 (six) hours as needed for wheezing or shortness of breath. 24 g 11   Blood Pressure Monitor KIT 1 kit by Does not apply route daily. 1 kit 0   cetirizine (ZYRTEC) 10 MG tablet Take 1 tablet (10 mg total) by mouth once daily. 30 tablet 2   citalopram (CELEXA) 20 MG tablet Take 20 mg by mouth daily.     fluticasone (FLONASE) 50 MCG/ACT nasal spray Spray 1 spray into both nostrils once daily. 16 g 2   rifaximin (XIFAXAN) 550 MG TABS tablet Take 1 tablet (550 mg total) by mouth 2 (two) times daily. 180 tablet 3   Roflumilast 250 MCG TABS Take 1 tablet by mouth once daily 30 tablet 2   umeclidinium bromide (INCRUSE ELLIPTA) 62.5 MCG/ACT AEPB Inhale 1 puff into the lungs daily. 90 each 12   citalopram (CELEXA) 10 MG tablet Take 10 mg by mouth daily. (Patient not taking: Reported on 08/27/2022)     cyclobenzaprine (FLEXERIL) 10 MG tablet Take 10 mg by mouth at bedtime as needed. (Patient not taking: Reported on 08/27/2022)     ibuprofen (ADVIL) 800 MG tablet Take 800 mg by mouth 3 (three) times daily. (Patient not taking: Reported on 08/27/2022)     losartan (COZAAR) 25 MG tablet TAKE ONE TABLET BY MOUTH ONCE EVERY DAY. 90 tablet 1   omeprazole (PRILOSEC) 20 MG capsule Take 1 capsule ( ) by mouth once every day. 90 capsule 1   polyethylene glycol-electrolytes (NULYTELY) 420 g solution Prepare according  to package instructions. Starting at 5:00 PM: Drink one 8 oz glass of mixture every 15 minutes until you finish half of the jug. Five hours prior to procedure, drink 8 oz glass of mixture every 15 minutes until it is all gone. Make sure you do not drink anything 4 hours prior to your procedure. (Patient not taking: Reported on 08/27/2022) 4000 mL 0   No current facility-administered medications for this visit.    PHYSICAL EXAMINATION:  Vitals:   08/27/22 1406  BP: 132/71  Pulse: 63  Temp: 98.6 F (37 C)  SpO2: 100%   There were no vitals filed for this visit.  Physical Exam Vitals and nursing note reviewed.  HENT:     Head: Normocephalic and atraumatic.     Mouth/Throat:     Pharynx: Oropharynx is clear.  Eyes:     Extraocular Movements: Extraocular movements intact.  Pupils: Pupils are equal, round, and reactive to light.  Cardiovascular:     Rate and Rhythm: Normal rate and regular rhythm.  Pulmonary:     Comments: Decreased breath sounds bilaterally.  Abdominal:     Palpations: Abdomen is soft.  Musculoskeletal:        General: Normal range of motion.     Cervical back: Normal range of motion.  Skin:    General: Skin is warm.  Neurological:     General: No focal deficit present.     Mental Status: She is alert and oriented to person, place, and time.  Psychiatric:        Behavior: Behavior normal.        Judgment: Judgment normal.      LABORATORY DATA:  I have reviewed the data as listed Lab Results  Component Value Date   WBC 4.4 08/27/2022   HGB 12.4 08/27/2022   HCT 36.7 08/27/2022   MCV 89.1 08/27/2022   PLT 83 (L) 08/27/2022   Recent Labs    08/27/22 1500  NA 139  K 3.4*  CL 106  CO2 23  GLUCOSE 106*  BUN 15  CREATININE 0.89  CALCIUM 9.0  GFRNONAA >60  PROT 7.3  ALBUMIN 3.9  AST 22  ALT 17  ALKPHOS 58  BILITOT 0.5    RADIOGRAPHIC STUDIES: I have personally reviewed the radiological images as listed and agreed with the findings in  the report. No results found.  Thrombocytopenia (HCC) # Thrombocytopenia: Moderate [50-80,000; no anemia/leukopenia] the etiology is likely secondary to liver disease/cirrhosis.  # I had a long discussion with patient regarding multiple etiologies including but not limited to liver disease/medications/autoimmune disease-ITP etc.  Less likely any malignant causes.  I would recommend holding off bone marrow biopsy at this time.   # History of hepatitis C/cirrhosis [Dr.Anna]-EGD 2020; recommended repeat due EGD in 2023.  Given Dr. Johnney Killian number for follow-up.  Also reminded the importance of surveillance imaging.  Also recommend alcohol cessation.  Recommend further work-up including-CBC CMP LDH; review of peripheral smear; HIV/hepatitis work-up.  Ultrasound abdomen-to rule out cirrhosis/splenomegaly.  # Airway reactive disease- [Dr.Dgyali]  Thank you Dr. Moshe Salisbury for allowing me to participate in the care of your pleasant patient. Please do not hesitate to contact me with questions or concerns in the interim.  # DISPOSITION: # Dr.Anna's office number - # labs today- CBC CMP LDH; review of peripheral smear;PT/PTT- HIV/hepatitis work-up;AFP- US abdomen # follow up in 2-3 weeks- MD; no labs- Dr.B  ...............................Marland Kitchen  All questions were answered. The patient knows to call the clinic with any problems, questions or concerns.    Earna Coder, MD 08/27/2022 3:56 PM

## 2022-08-27 NOTE — Telephone Encounter (Signed)
I have notified the patient. Nothing further needed. 

## 2022-08-27 NOTE — Progress Notes (Signed)
No concerns today 

## 2022-08-28 LAB — AFP TUMOR MARKER: AFP, Serum, Tumor Marker: 2.5 ng/mL (ref 0.0–9.2)

## 2022-08-30 ENCOUNTER — Ambulatory Visit
Admission: RE | Admit: 2022-08-30 | Discharge: 2022-08-30 | Disposition: A | Discharge status: Home / Self Care | Payer: 59 | Source: Ambulatory Visit | Attending: Student in an Organized Health Care Education/Training Program | Admitting: Student in an Organized Health Care Education/Training Program

## 2022-08-30 DIAGNOSIS — R161 Splenomegaly, not elsewhere classified: Secondary | ICD-10-CM | POA: Diagnosis not present

## 2022-08-30 DIAGNOSIS — K7460 Unspecified cirrhosis of liver: Secondary | ICD-10-CM | POA: Insufficient documentation

## 2022-08-30 DIAGNOSIS — D696 Thrombocytopenia, unspecified: Secondary | ICD-10-CM | POA: Insufficient documentation

## 2022-08-30 DIAGNOSIS — K449 Diaphragmatic hernia without obstruction or gangrene: Secondary | ICD-10-CM | POA: Diagnosis not present

## 2022-08-30 DIAGNOSIS — Z79891 Long term (current) use of opiate analgesic: Secondary | ICD-10-CM | POA: Diagnosis not present

## 2022-08-30 DIAGNOSIS — Z8709 Personal history of other diseases of the respiratory system: Secondary | ICD-10-CM

## 2022-08-30 DIAGNOSIS — I700 Atherosclerosis of aorta: Secondary | ICD-10-CM | POA: Diagnosis not present

## 2022-08-30 DIAGNOSIS — R0602 Shortness of breath: Secondary | ICD-10-CM | POA: Diagnosis not present

## 2022-08-30 DIAGNOSIS — J439 Emphysema, unspecified: Secondary | ICD-10-CM | POA: Diagnosis not present

## 2022-09-05 ENCOUNTER — Ambulatory Visit: Payer: 59

## 2022-09-11 ENCOUNTER — Other Ambulatory Visit: Payer: 59

## 2022-09-12 ENCOUNTER — Ambulatory Visit: Payer: 59

## 2022-09-12 ENCOUNTER — Inpatient Hospital Stay: Payer: 59 | Admitting: Internal Medicine

## 2022-09-14 ENCOUNTER — Ambulatory Visit: Payer: Medicaid Other | Admitting: Internal Medicine

## 2022-09-20 ENCOUNTER — Ambulatory Visit
Admission: RE | Admit: 2022-09-20 | Discharge: 2022-09-20 | Disposition: A | Discharge status: Home / Self Care | Payer: 59 | Source: Ambulatory Visit | Attending: Internal Medicine | Admitting: Internal Medicine

## 2022-09-20 DIAGNOSIS — K7460 Unspecified cirrhosis of liver: Secondary | ICD-10-CM | POA: Diagnosis not present

## 2022-09-20 DIAGNOSIS — D696 Thrombocytopenia, unspecified: Secondary | ICD-10-CM | POA: Insufficient documentation

## 2022-09-20 DIAGNOSIS — R161 Splenomegaly, not elsewhere classified: Secondary | ICD-10-CM | POA: Diagnosis not present

## 2022-09-24 ENCOUNTER — Encounter: Payer: Self-pay | Admitting: Internal Medicine

## 2022-09-24 ENCOUNTER — Inpatient Hospital Stay: Payer: 59 | Attending: Internal Medicine | Admitting: Internal Medicine

## 2022-09-24 VITALS — BP 126/81 | HR 72 | Temp 99.0°F | Resp 18 | Wt 190.4 lb

## 2022-09-24 DIAGNOSIS — Z79899 Other long term (current) drug therapy: Secondary | ICD-10-CM | POA: Insufficient documentation

## 2022-09-24 DIAGNOSIS — B1920 Unspecified viral hepatitis C without hepatic coma: Secondary | ICD-10-CM | POA: Insufficient documentation

## 2022-09-24 DIAGNOSIS — F17210 Nicotine dependence, cigarettes, uncomplicated: Secondary | ICD-10-CM | POA: Insufficient documentation

## 2022-09-24 DIAGNOSIS — K7460 Unspecified cirrhosis of liver: Secondary | ICD-10-CM | POA: Diagnosis not present

## 2022-09-24 DIAGNOSIS — D696 Thrombocytopenia, unspecified: Secondary | ICD-10-CM | POA: Insufficient documentation

## 2022-09-24 NOTE — Progress Notes (Signed)
[facility] CONSULT NOTE  Patient Care Team: Center, [facility] as PCP - General Jim Like, RN as Registered Nurse Scarlett Presto, RN (Inactive) as Registered Nurse  CHIEF COMPLAINTS/PURPOSE OF CONSULTATION: Thrombocytopenia  # Chronic thrombocytopenia  # cirrhosis / Hep C-  Dr. Tobi Bastos in 2020- EGD.  HISTORY OF PRESENTING ILLNESS: Patient ambulating-independently. Accompanied by family.   Monique Edwards 57 y.o.  female with prior history of hepatitis C/cirrhosis chronic thrombocytopenia is here to review the results of her blood work and also ultrasound ordered for her thrombocytopenia.  Easy bruising/bleeding: chronic   Medications: no anticoagulation.   Alcohol: once 2-3 times a week.   Antiplatelet therapy/blood thinners: none  Review of Systems  Constitutional:  Positive for malaise/fatigue. Negative for chills, diaphoresis, fever and weight loss.  HENT:  Negative for nosebleeds and sore throat.   Eyes:  Negative for double vision.  Respiratory:  Negative for cough, hemoptysis, sputum production, shortness of breath and wheezing.   Cardiovascular:  Negative for chest pain, palpitations, orthopnea and leg swelling.  Gastrointestinal:  Negative for abdominal pain, blood in stool, constipation, diarrhea, heartburn, melena, nausea and vomiting.  Genitourinary:  Negative for dysuria, frequency and urgency.  Musculoskeletal:  Negative for back pain and joint pain.  Skin: Negative.  Negative for itching and rash.  Neurological:  Negative for dizziness, tingling, focal weakness, weakness and headaches.  Endo/Heme/Allergies:  Bruises/bleeds easily.  Psychiatric/Behavioral:  Negative for depression. The patient is not nervous/anxious and does not have insomnia.      MEDICAL HISTORY:  Past Medical History:  Diagnosis Date   Arthritis    COPD (chronic obstructive pulmonary disease) (HCC)    Fatty liver    GERD (gastroesophageal reflux disease)     Hypertension     SURGICAL HISTORY: Past Surgical History:  Procedure Laterality Date   CHOLECYSTECTOMY     ESOPHAGOGASTRODUODENOSCOPY (EGD) WITH PROPOFOL N/A 06/06/2018   Procedure: ESOPHAGOGASTRODUODENOSCOPY (EGD) WITH PROPOFOL;  Surgeon: Wyline Mood, MD;  Location: [facility] ENDOSCOPY;  Service: Gastroenterology;  Laterality: N/A;    SOCIAL HISTORY: Social History   Socioeconomic History   Marital status: Widowed    Spouse name: Not on file   Number of children: Not on file   Years of education: Not on file   Highest education level: Not on file  Occupational History   Occupation: Unemployed  Tobacco Use   Smoking status: Some Days    Years: 16    Types: Cigarettes    Start date: 11/04/1980   Smokeless tobacco: Never   Tobacco comments:    1 pack per week--08/14/2022  Vaping Use   Vaping Use: Never used  Substance and Sexual Activity   Alcohol use: Yes    Alcohol/week: 5.0 standard drinks of alcohol    Types: 5 Cans of beer per week    Comment: 2-3 beers every few weeks   Drug use: Not Currently    Types: Cocaine    Comment: last use 2021   Sexual activity: Yes    Partners: Male  Other Topics Concern   Not on file  Social History Narrative   Not on file   Social Determinants of Health   Financial Resource Strain: Not on file  Food Insecurity: No Food Insecurity (08/27/2022)   Hunger Vital Sign    Worried About Running Out of Food in the Last Year: Never true    Ran Out of Food in the Last Year: Never true  Transportation Needs: No Transportation  Needs (08/27/2022)   PRAPARE - Administrator, Civil Service (Medical): No    Lack of Transportation (Non-Medical): No  Physical Activity: Not on file  Stress: Not on file  Social Connections: Not on file  Intimate Partner Violence: Not on file    FAMILY HISTORY: Family History  Problem Relation Age of Onset   Hypertension Mother    Stroke Mother    Diabetes Father    Arthritis Sister    Thyroid  disease Sister    Hypertension Brother    Hypertension Brother     ALLERGIES:  is allergic to amlodipine and lisinopril.  MEDICATIONS:  Current Outpatient Medications  Medication Sig Dispense Refill   ADVAIR DISKUS 250-50 MCG/ACT AEPB Inhale 1 puff into the lungs in the morning and at bedtime. 60 each 12   albuterol (PROVENTIL HFA) 108 (90 Base) MCG/ACT inhaler Inhale 2 puffs into the lungs every 6 (six) hours as needed for wheezing or shortness of breath. 24 g 11   cetirizine (ZYRTEC) 10 MG tablet Take 1 tablet (10 mg total) by mouth once daily. 30 tablet 2   citalopram (CELEXA) 10 MG tablet Take 10 mg by mouth daily.     citalopram (CELEXA) 20 MG tablet Take 20 mg by mouth daily.     fluticasone (FLONASE) 50 MCG/ACT nasal spray Spray 1 spray into both nostrils once daily. 16 g 2   ibuprofen (ADVIL) 800 MG tablet Take 800 mg by mouth 3 (three) times daily.     losartan (COZAAR) 25 MG tablet TAKE ONE TABLET BY MOUTH ONCE EVERY DAY. 90 tablet 1   omeprazole (PRILOSEC) 20 MG capsule Take 1 capsule (20mg ) by mouth once every day. (Patient taking differently: Take 20 mg by mouth daily.) 90 capsule 1   rifaximin (XIFAXAN) 550 MG TABS tablet Take 1 tablet (550 mg total) by mouth 2 (two) times daily. 180 tablet 3   Roflumilast 250 MCG TABS Take 1 tablet by mouth once daily 30 tablet 2   umeclidinium bromide (INCRUSE ELLIPTA) 62.5 MCG/ACT AEPB Inhale 1 puff into the lungs daily. 90 each 12   Blood Pressure Monitor KIT 1 kit by Does not apply route daily. 1 kit 0   polyethylene glycol-electrolytes (NULYTELY) 420 g solution Prepare according to package instructions. Starting at 5:00 PM: Drink one 8 oz glass of mixture every 15 minutes until you finish half of the jug. Five hours prior to procedure, drink 8 oz glass of mixture every 15 minutes until it is all gone. Make sure you do not drink anything 4 hours prior to your procedure. (Patient not taking: Reported on 08/27/2022) 4000 mL 0   No current  facility-administered medications for this visit.    PHYSICAL EXAMINATION:  Vitals:   09/24/22 1527  BP: 126/81  Pulse: 72  Resp: 18  Temp: 99 F (37.2 C)  SpO2: 97%    Filed Weights   09/24/22 1527  Weight: 190 lb 6.4 oz (86.4 kg)    Physical Exam Vitals and nursing note reviewed.  HENT:     Head: Normocephalic and atraumatic.     Mouth/Throat:     Pharynx: Oropharynx is clear.  Eyes:     Extraocular Movements: Extraocular movements intact.     Pupils: Pupils are equal, round, and reactive to light.  Cardiovascular:     Rate and Rhythm: Normal rate and regular rhythm.  Pulmonary:     Comments: Decreased breath sounds bilaterally.  Abdominal:     Palpations:  Abdomen is soft.  Musculoskeletal:        General: Normal range of motion.     Cervical back: Normal range of motion.  Skin:    General: Skin is warm.  Neurological:     General: No focal deficit present.     Mental Status: She is alert and oriented to person, place, and time.  Psychiatric:        Behavior: Behavior normal.        Judgment: Judgment normal.      LABORATORY DATA:  I have reviewed the data as listed Lab Results  Component Value Date   WBC 4.4 08/27/2022   HGB 12.4 08/27/2022   HCT 36.7 08/27/2022   MCV 89.1 08/27/2022   PLT 83 (L) 08/27/2022   Recent Labs    08/27/22 1500  NA 139  K 3.4*  CL 106  CO2 23  GLUCOSE 106*  BUN 15  CREATININE 0.89  CALCIUM 9.0  GFRNONAA >60  PROT 7.3  ALBUMIN 3.9  AST 22  ALT 17  ALKPHOS 58  BILITOT 0.5    RADIOGRAPHIC STUDIES: I have personally reviewed the radiological images as listed and agreed with the findings in the report. US Abdomen Complete  Result Date: 09/21/2022 CLINICAL DATA:  cirrhosis/splenomegaly EXAM: ABDOMEN ULTRASOUND COMPLETE COMPARISON:  May 23, 2018 FINDINGS: Gallbladder: Surgically absent. Common bile duct: Diameter: Visualized portion measures 6 mm, within normal limits. Liver: No focal lesion identified.  Heterogeneous and mildly coarsened in parenchymal echogenicity with mildly lobular liver contours and separation of the fissures. Portal vein is patent on color Doppler imaging with normal direction of blood flow towards the liver. IVC: No abnormality visualized. Pancreas: Visualized portion unremarkable. Spleen: Spleen measures the upper limits of normal with an estimated volume 312 ML, remeasured. Right Kidney: Length: 10.2 cm. Echogenicity within normal limits. No mass or hydronephrosis visualized. Left Kidney: Length: 10.4 cm. Echogenicity within normal limits. No mass or hydronephrosis visualized. Abdominal aorta: No aneurysm visualized. Other findings: None. IMPRESSION: 1. Morphologic changes suggestive of underlying cirrhosis. No focal lesion identified. 2. Spleen measures the upper limits of normal with an estimated volume of 312 mL. Electronically Signed   By: Meda Klinefelter M.D.   On: 09/21/2022 12:44   CT CHEST WO CONTRAST  Result Date: 09/04/2022 CLINICAL DATA:  Cough, chronic/persisting greater than 8 weeks, field inferior treatment. Shortness of breath. Thrombocytopenia. History of COPD. EXAM: CT CHEST WITHOUT CONTRAST TECHNIQUE: Multidetector CT imaging of the chest was performed following the standard protocol without IV contrast. RADIATION DOSE REDUCTION: This exam was performed according to the departmental dose-optimization program which includes automated exposure control, adjustment of the mA and/or kV according to patient size and/or use of iterative reconstruction technique. COMPARISON:  Chest radiograph 01/09/2022. No prior chest CT available. FINDINGS: Cardiovascular: The heart is normal in size. There is no pericardial effusion. Mild atherosclerosis of the thoracic aorta without aneurysm. Mediastinum/Nodes: Scattered small mediastinal lymph nodes, not enlarged by size criteria. No axillary adenopathy. Limited hilar assessment in the absence of IV contrast. Small hiatal hernia with  mild wall thickening of the distal esophagus. Minimal fluid in the herniated fat adjacent to the hiatal hernia. Lungs/Pleura: Faint nonspecific ground-glass opacities in the left lower lobe, for example series 4 image 111. minimal apical emphysema. The trachea and central airways are clear, no endobronchial lesion or debris. Mild scarring in the medial right lower lobe adjacent to thoracic osteophytes. No pulmonary mass or suspicious nodule. No pleural fluid. Upper Abdomen: The  spleen is enlarged spanning 15.2 cm AP. Calcified granuloma in the caudate lobe. There is subtle capsular nodularity of the liver which can be seen with cirrhosis. Cholecystectomy. Musculoskeletal: The bones are under mineralized. Thoracic spondylosis with anterior spurring. Left glenohumeral degenerative change. Unremarkable chest wall soft tissues. IMPRESSION: 1. Faint nonspecific ground-glass opacities in the left lower lobe, may be infectious or inflammatory. 2. Mild emphysema. 3. Small hiatal hernia with mild wall thickening of the distal esophagus, can be seen with reflux or esophagitis. 4. Cirrhosis and splenomegaly. Aortic Atherosclerosis (ICD10-I70.0) and Emphysema (ICD10-J43.9). Electronically Signed   By: Narda Rutherford M.D.   On: 09/04/2022 16:53    Thrombocytopenia (HCC) # Thrombocytopenia: Moderate [50-80,000; no anemia/leukopenia] the etiology is likely secondary to liver disease/cirrhosis.  # Hypokalemia: recommend dietary supplementaions.   # B12 def [apil 2024- 207]  # History of hepatitis C/cirrhosis [Dr.Anna]-EGD 2020; recommended repeat due EGD in 2023.  Given Dr. Johnney Killian number for follow-up.  MAY 2024- [place] Morphologic changes suggestive of underlying cirrhosis. No focal lesion identified. Mild splenomegaly.  Also recommend alcohol cessation.  # Airway reactive disease- [Dr.Dgyali]  # DISPOSITION: # follow up in 6 months-  MD; 1 week prior- CBC CMP LDH; PT/PTT- AFP; US abdomen Dr.B   All questions were  answered. The patient knows to call the clinic with any problems, questions or concerns.    Earna Coder, MD 09/24/2022 4:00 PM

## 2022-09-24 NOTE — Progress Notes (Signed)
Pt here for Results of her lab work for thrombocytopenia.

## 2022-09-24 NOTE — Assessment & Plan Note (Addendum)
#   Thrombocytopenia: Moderate [50-80,000; no anemia/leukopenia] the etiology is likely secondary to liver disease/cirrhosis.  # Hypokalemia: recommend dietary supplementaions.   # B12 def [apil 2024- 207]  # History of hepatitis C/cirrhosis [Dr.Anna]-EGD 2020; recommended repeat due EGD in 2023.  Given Dr. Johnney Killian number for follow-up.  MAY 2024- [place] Morphologic changes suggestive of underlying cirrhosis. No focal lesion identified. Mild splenomegaly.  Also recommend alcohol cessation.  # Airway reactive disease- [Dr.Dgyali]  # DISPOSITION: # follow up in 6 months-  MD; 1 week prior- CBC CMP LDH; PT/PTT- AFP; US abdomen Dr.B

## 2022-10-29 ENCOUNTER — Other Ambulatory Visit: Payer: Self-pay

## 2022-10-29 DIAGNOSIS — J441 Chronic obstructive pulmonary disease with (acute) exacerbation: Secondary | ICD-10-CM

## 2022-10-29 MED ORDER — ROFLUMILAST 250 MCG PO TABS
1.0000 | ORAL_TABLET | Freq: Every day | ORAL | 2 refills | Status: DC
Start: 2022-10-29 — End: 2023-04-11

## 2023-02-20 ENCOUNTER — Encounter: Payer: Self-pay | Admitting: Student in an Organized Health Care Education/Training Program

## 2023-02-20 ENCOUNTER — Ambulatory Visit (INDEPENDENT_AMBULATORY_CARE_PROVIDER_SITE_OTHER): Payer: 59 | Admitting: Student in an Organized Health Care Education/Training Program

## 2023-02-20 VITALS — BP 110/62 | HR 62 | Temp 97.8°F | Ht 64.0 in | Wt 201.1 lb

## 2023-02-20 DIAGNOSIS — F17210 Nicotine dependence, cigarettes, uncomplicated: Secondary | ICD-10-CM

## 2023-02-20 DIAGNOSIS — F17200 Nicotine dependence, unspecified, uncomplicated: Secondary | ICD-10-CM

## 2023-02-20 DIAGNOSIS — Z23 Encounter for immunization: Secondary | ICD-10-CM | POA: Diagnosis not present

## 2023-02-20 DIAGNOSIS — J42 Unspecified chronic bronchitis: Secondary | ICD-10-CM | POA: Diagnosis not present

## 2023-02-20 DIAGNOSIS — R0602 Shortness of breath: Secondary | ICD-10-CM | POA: Diagnosis not present

## 2023-02-20 NOTE — Progress Notes (Signed)
Assessment & Plan:   1. Shortness of breath 2. Chronic bronchitis, unspecified chronic bronchitis type (HCC) 3. Tobacco Use Disorder  Presents for follow up today. Symptoms remain stable without any exacerbation. PFT's previously performed showed normal ratio and signs of minimal obstruction on the flow volume loop. This is consistent with asthma more so than COPD, though she has a significant history of smoking. There could be a component PRISM thou there isn't any impairment of spirometry. She is currently well controlled with Advair. Will continue her on triple therapy for the time being. Patient was counseled extensively on the need for full smoking cessation. Finally, chest CT obtained prior to today's visit  which I have personally reviewed and doesn't show any abnormalities that would explain her symptoms. Specifically, there aren't any nodules/lesions, bronchiectasis, or ILD.  -Smoking cessation counseling given -Continue Advair twice daily and Incruse once daily -Continue Daliresp -Flu vaccine today   #thrombocytopenia  This is likely secondary to her liver cirrhosis. She was seen by hematology and reports having a follow up appointment with her gastroenterologist. Will continue to monitor.  Return in about 1 year (around 02/20/2024).  I spent 33 minutes caring for this patient today, including preparing to see the patient, obtaining a medical history , reviewing a separately obtained history, performing a medically appropriate examination and/or evaluation, counseling and educating the patient/family/caregiver, ordering medications, tests, or procedures, documenting clinical information in the electronic health record, and independently interpreting results (not separately reported/billed) and communicating results to the patient/family/caregiver. 3 minutes spent counseling the patient on smoking cessation. This visit was part of ongoing longitudinal management of the patient's  chronic medical problems.  Raechel Chute, MD [facility] 02/20/2023 3:02 PM    End of visit medications:  No orders of the defined types were placed in this encounter.    Current Outpatient Medications:    ADVAIR DISKUS 250-50 MCG/ACT AEPB, Inhale 1 puff into the lungs in the morning and at bedtime., Disp: 60 each, Rfl: 12   albuterol (PROVENTIL HFA) 108 (90 Base) MCG/ACT inhaler, Inhale 2 puffs into the lungs every 6 (six) hours as needed for wheezing or shortness of breath., Disp: 24 g, Rfl: 11   cetirizine (ZYRTEC) 10 MG tablet, Take 1 tablet (10 mg total) by mouth once daily., Disp: 30 tablet, Rfl: 2   citalopram (CELEXA) 20 MG tablet, Take 20 mg by mouth daily., Disp: , Rfl:    fluticasone (FLONASE) 50 MCG/ACT nasal spray, Spray 1 spray into both nostrils once daily., Disp: 16 g, Rfl: 2   ibuprofen (ADVIL) 800 MG tablet, Take 800 mg by mouth 3 (three) times daily., Disp: , Rfl:    losartan (COZAAR) 25 MG tablet, TAKE ONE TABLET BY MOUTH ONCE EVERY DAY., Disp: 90 tablet, Rfl: 1   omeprazole (PRILOSEC) 20 MG capsule, Take 1 capsule (20mg ) by mouth once every day. (Patient taking differently: Take 20 mg by mouth daily.), Disp: 90 capsule, Rfl: 1   rifaximin (XIFAXAN) 550 MG TABS tablet, Take 1 tablet (550 mg total) by mouth 2 (two) times daily., Disp: 180 tablet, Rfl: 3   Roflumilast 250 MCG TABS, Take 1 tablet by mouth daily., Disp: 30 tablet, Rfl: 2   umeclidinium bromide (INCRUSE ELLIPTA) 62.5 MCG/ACT AEPB, Inhale 1 puff into the lungs daily., Disp: 90 each, Rfl: 12   citalopram (CELEXA) 10 MG tablet, Take 10 mg by mouth daily., Disp: , Rfl:    Subjective:   PATIENT ID: Monique Edwards GENDER:  female DOB: April 21, 1966, MRN: [id number]  Chief Complaint  Patient presents with   Follow-up    Shortness of breath on exertion. Cough with dark phlegm.     HPI  Patient is a 57 year old female, active smoker, presenting to clinic for follow up.   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 Nicholos Johns, and most recently by Ames Dura, NP and Rubye Oaks, NP. She has transitioned her care over to me.  She is presenting today for follow up with stable symptoms. Reports that she has chronic shortness of breath with exertion that is unchanged. She is short of breath walking from the parking lot to our office. This is a chronic issue for her. She also reports occasional wheezing and an occasional cough productive of sputum (3 times a week). She doesn't report any chest pain or tightness. She is maintained on Advair and Incruse in addition to Daliresp, with improvement in symptoms and stability.    Patient had PFT prior to her last visit, and has had her chest CT prior to this visit. PFT's  appear to be normal with signs of minimal obstruction on flow volume loop. She is compliant with her medications.  She is continuing to smoke 3-5 cigarettes a day.  She denies any other inhalational exposures.  She reports that she has a liver doctor for which she follows for liver cirrhosis.   Started smoking around 57 yo. 1 PPD at heaviest. Denies other inhalational exposure, denies drug use, and denies vaping. Disabled due to arthritis and COPD. Reports previous history of heavy alcohol use.  Ancillary information including prior medications, full medical/surgical/family/social histories, and PFTs (when available) are listed below and have been reviewed.   Review of Systems  Constitutional:  Negative for chills, fever and weight loss.  Respiratory:  Positive for cough, sputum production, shortness of breath and wheezing.   Cardiovascular:  Negative for chest pain.  Skin:  Negative for rash.     Objective:   Vitals:   02/20/23 1446  BP: 110/62  Pulse: 62  Temp: 97.8 F (36.6 C)  TempSrc: Temporal  SpO2: 97%  Weight: 201 lb 1.6 oz (91.2 kg)  Height: 5\' 4"  (1.626 m)   97% on RA  BMI Readings from  Last 3 Encounters:  02/20/23 34.52 kg/m  09/24/22 32.68 kg/m  08/27/22 32.89 kg/m   Wt Readings from Last 3 Encounters:  02/20/23 201 lb 1.6 oz (91.2 kg)  09/24/22 190 lb 6.4 oz (86.4 kg)  08/14/22 191 lb 9.6 oz (86.9 kg)    Physical Exam Constitutional:      Appearance: She is obese. She is not ill-appearing.  HENT:     Mouth/Throat:     Mouth: Mucous membranes are moist.  Cardiovascular:     Rate and Rhythm: Normal rate and regular rhythm.     Pulses: Normal pulses.     Heart sounds: Normal heart sounds.  Pulmonary:     Effort: Pulmonary effort is normal.     Breath sounds: Normal breath sounds. No wheezing, rhonchi or rales.  Abdominal:     Palpations: Abdomen is soft.  Musculoskeletal:     Right lower leg: No edema.     Left lower leg: No edema.  Neurological:     General: No focal deficit present.     Mental Status: She is alert and oriented to person, place, and time. Mental status is at baseline.  Ancillary Information    Past Medical History:  Diagnosis Date   Arthritis    COPD (chronic obstructive pulmonary disease) (HCC)    Fatty liver    GERD (gastroesophageal reflux disease)    Hypertension      Family History  Problem Relation Age of Onset   Hypertension Mother    Stroke Mother    Diabetes Father    Arthritis Sister    Thyroid disease Sister    Hypertension Brother    Hypertension Brother      Past Surgical History:  Procedure Laterality Date   CHOLECYSTECTOMY     ESOPHAGOGASTRODUODENOSCOPY (EGD) WITH PROPOFOL N/A 06/06/2018   Procedure: ESOPHAGOGASTRODUODENOSCOPY (EGD) WITH PROPOFOL;  Surgeon: Wyline Mood, MD;  Location: [facility]COPY;  Service: Gastroenterology;  Laterality: N/A;    Social History   Socioeconomic History   Marital status: Widowed    Spouse name: Not on file   Number of children: Not on file   Years of education: Not on file   Highest education level: Not on file  Occupational History   Occupation:  Unemployed  Tobacco Use   Smoking status: Some Days    Types: Cigarettes    Start date: 11/04/1980   Smokeless tobacco: Never   Tobacco comments:    1 pack per week--08/14/2022  Vaping Use   Vaping status: Never Used  Substance and Sexual Activity   Alcohol use: Yes    Alcohol/week: 5.0 standard drinks of alcohol    Types: 5 Cans of beer per week    Comment: 2-3 beers every few weeks   Drug use: Not Currently    Types: Cocaine    Comment: last use 2021   Sexual activity: Yes    Partners: Male  Other Topics Concern   Not on file  Social History Narrative   Not on file   Social Determinants of Health   Financial Resource Strain: Not on file  Food Insecurity: No Food Insecurity (08/27/2022)   Hunger Vital Sign    Worried About Running Out of Food in the Last Year: Never true    Ran Out of Food in the Last Year: Never true  Transportation Needs: No Transportation Needs (08/27/2022)   PRAPARE - Administrator, Civil Service (Medical): No    Lack of Transportation (Non-Medical): No  Physical Activity: Not on file  Stress: Not on file  Social Connections: Not on file  Intimate Partner Violence: Not on file     Allergies  Allergen Reactions   Amlodipine Swelling    Peripheral edema   Lisinopril Cough     CBC    Component Value Date/Time   WBC 4.4 08/27/2022 1500   RBC 4.12 08/27/2022 1500   HGB 12.4 08/27/2022 1500   HGB 13.3 03/08/2021 1455   HCT 36.7 08/27/2022 1500   HCT 39.8 03/08/2021 1455   PLT 83 (L) 08/27/2022 1500   PLT 81 (LL) 03/08/2021 1455   MCV 89.1 08/27/2022 1500   MCV 92 03/08/2021 1455   MCH 30.1 08/27/2022 1500   MCHC 33.8 08/27/2022 1500   RDW 13.2 08/27/2022 1500   RDW 12.4 03/08/2021 1455   LYMPHSABS 1.2 08/27/2022 1500   LYMPHSABS 1.0 04/12/2020 1534   MONOABS 0.3 08/27/2022 1500   EOSABS 0.1 08/27/2022 1500   EOSABS 0.1 04/12/2020 1534   BASOSABS 0.0 08/27/2022 1500   BASOSABS 0.0 04/12/2020 1534    Pulmonary Functions  Testing Results:    Latest Ref Rng & Units 03/22/2022  4:07 PM  PFT Results  FVC-Pre L 2.28   FVC-Predicted Pre % 78   FVC-Post L 2.36   FVC-Predicted Post % 80   Pre FEV1/FVC % % 80   Post FEV1/FCV % % 82   FEV1-Pre L 1.83   FEV1-Predicted Pre % 80   FEV1-Post L 1.94   DLCO uncorrected ml/min/mmHg 17.37   DLCO UNC% % 97   DLVA Predicted % 98   TLC L 3.64   TLC % Predicted % 81   RV % Predicted % 78     Outpatient Medications Prior to Visit  Medication Sig Dispense Refill   ADVAIR DISKUS 250-50 MCG/ACT AEPB Inhale 1 puff into the lungs in the morning and at bedtime. 60 each 12   albuterol (PROVENTIL HFA) 108 (90 Base) MCG/ACT inhaler Inhale 2 puffs into the lungs every 6 (six) hours as needed for wheezing or shortness of breath. 24 g 11   cetirizine (ZYRTEC) 10 MG tablet Take 1 tablet (10 mg total) by mouth once daily. 30 tablet 2   citalopram (CELEXA) 20 MG tablet Take 20 mg by mouth daily.     fluticasone (FLONASE) 50 MCG/ACT nasal spray Spray 1 spray into both nostrils once daily. 16 g 2   ibuprofen (ADVIL) 800 MG tablet Take 800 mg by mouth 3 (three) times daily.     losartan (COZAAR) 25 MG tablet TAKE ONE TABLET BY MOUTH ONCE EVERY DAY. 90 tablet 1   omeprazole (PRILOSEC) 20 MG capsule Take 1 capsule (20mg ) by mouth once every day. (Patient taking differently: Take 20 mg by mouth daily.) 90 capsule 1   rifaximin (XIFAXAN) 550 MG TABS tablet Take 1 tablet (550 mg total) by mouth 2 (two) times daily. 180 tablet 3   Roflumilast 250 MCG TABS Take 1 tablet by mouth daily. 30 tablet 2   umeclidinium bromide (INCRUSE ELLIPTA) 62.5 MCG/ACT AEPB Inhale 1 puff into the lungs daily. 90 each 12   citalopram (CELEXA) 10 MG tablet Take 10 mg by mouth daily.     Blood Pressure Monitor KIT 1 kit by Does not apply route daily. 1 kit 0   polyethylene glycol-electrolytes (NULYTELY) 420 g solution Prepare according to package instructions. Starting at 5:00 PM: Drink one 8 oz glass of mixture  every 15 minutes until you finish half of the jug. Five hours prior to procedure, drink 8 oz glass of mixture every 15 minutes until it is all gone. Make sure you do not drink anything 4 hours prior to your procedure. (Patient not taking: Reported on 08/27/2022) 4000 mL 0   No facility-administered medications prior to visit.

## 2023-03-20 ENCOUNTER — Inpatient Hospital Stay: Payer: 59 | Attending: Internal Medicine

## 2023-03-20 ENCOUNTER — Ambulatory Visit: Admission: RE | Admit: 2023-03-20 | Payer: 59 | Source: Ambulatory Visit

## 2023-03-21 ENCOUNTER — Encounter: Payer: Self-pay | Admitting: Internal Medicine

## 2023-03-27 ENCOUNTER — Inpatient Hospital Stay: Payer: 59 | Admitting: Internal Medicine

## 2023-04-10 ENCOUNTER — Other Ambulatory Visit: Payer: Self-pay | Admitting: Adult Health

## 2023-04-10 DIAGNOSIS — J441 Chronic obstructive pulmonary disease with (acute) exacerbation: Secondary | ICD-10-CM

## 2023-05-21 DIAGNOSIS — M25552 Pain in left hip: Secondary | ICD-10-CM | POA: Diagnosis not present

## 2023-05-21 DIAGNOSIS — F17213 Nicotine dependence, cigarettes, with withdrawal: Secondary | ICD-10-CM | POA: Diagnosis not present

## 2023-05-21 DIAGNOSIS — Z23 Encounter for immunization: Secondary | ICD-10-CM | POA: Diagnosis not present

## 2023-05-21 DIAGNOSIS — K7460 Unspecified cirrhosis of liver: Secondary | ICD-10-CM | POA: Diagnosis not present

## 2023-05-21 DIAGNOSIS — Z1389 Encounter for screening for other disorder: Secondary | ICD-10-CM | POA: Diagnosis not present

## 2023-05-21 DIAGNOSIS — Z0000 Encounter for general adult medical examination without abnormal findings: Secondary | ICD-10-CM | POA: Diagnosis not present

## 2023-05-24 ENCOUNTER — Other Ambulatory Visit: Payer: Self-pay | Admitting: Nurse Practitioner

## 2023-05-24 DIAGNOSIS — Z1231 Encounter for screening mammogram for malignant neoplasm of breast: Secondary | ICD-10-CM

## 2023-05-29 ENCOUNTER — Telehealth: Payer: Self-pay | Admitting: Gastroenterology

## 2023-05-29 NOTE — Telephone Encounter (Signed)
The patient called in left a voicemail for [place] to call her back. I called the patient back to let her know that we received her message, and someone picked up he said she will call us back.

## 2023-06-20 ENCOUNTER — Telehealth: Payer: Self-pay | Admitting: Student in an Organized Health Care Education/Training Program

## 2023-06-20 ENCOUNTER — Other Ambulatory Visit (HOSPITAL_COMMUNITY): Payer: Self-pay

## 2023-06-20 DIAGNOSIS — Z8709 Personal history of other diseases of the respiratory system: Secondary | ICD-10-CM

## 2023-06-20 MED ORDER — ADVAIR DISKUS 250-50 MCG/ACT IN AEPB
1.0000 | INHALATION_SPRAY | Freq: Two times a day (BID) | RESPIRATORY_TRACT | 12 refills | Status: DC
Start: 2023-06-20 — End: 2023-09-24

## 2023-06-20 NOTE — Telephone Encounter (Signed)
Generic Advair Diskus is preferred and covered for $0.00

## 2023-06-20 NOTE — Telephone Encounter (Signed)
Generic Advair has been sent to pharmacy.  Pt is aware and voiced her understanding.  Nothing further needed.

## 2023-06-20 NOTE — Telephone Encounter (Signed)
Patient states Advair inhaler needs prior authorization. Pharmacy is [facility] [address] Patient phone number is [phone] and [phone].

## 2023-06-20 NOTE — Telephone Encounter (Signed)
PA team, please advise. Thanks ?

## 2023-07-04 ENCOUNTER — Other Ambulatory Visit: Payer: Self-pay

## 2023-07-04 ENCOUNTER — Ambulatory Visit (INDEPENDENT_AMBULATORY_CARE_PROVIDER_SITE_OTHER): Payer: 59 | Admitting: Gastroenterology

## 2023-07-04 VITALS — BP 141/81 | HR 62 | Temp 97.7°F | Ht 62.0 in | Wt 210.0 lb

## 2023-07-04 DIAGNOSIS — F17210 Nicotine dependence, cigarettes, uncomplicated: Secondary | ICD-10-CM | POA: Diagnosis not present

## 2023-07-04 DIAGNOSIS — Z8619 Personal history of other infectious and parasitic diseases: Secondary | ICD-10-CM

## 2023-07-04 DIAGNOSIS — Z8719 Personal history of other diseases of the digestive system: Secondary | ICD-10-CM

## 2023-07-04 DIAGNOSIS — F1090 Alcohol use, unspecified, uncomplicated: Secondary | ICD-10-CM | POA: Diagnosis not present

## 2023-07-04 DIAGNOSIS — Z860100 Personal history of colon polyps, unspecified: Secondary | ICD-10-CM

## 2023-07-04 DIAGNOSIS — K7460 Unspecified cirrhosis of liver: Secondary | ICD-10-CM | POA: Diagnosis not present

## 2023-07-04 DIAGNOSIS — I8500 Esophageal varices without bleeding: Secondary | ICD-10-CM

## 2023-07-04 MED ORDER — OMEPRAZOLE 40 MG PO CPDR: 40.0000 mg | DELAYED_RELEASE_CAPSULE | Freq: Every day | ORAL | 3 refills | Status: AC

## 2023-07-04 MED ORDER — NA SULFATE-K SULFATE-MG SULF 17.5-3.13-1.6 GM/177ML PO SOLN: 354.0000 mL | Freq: Once | ORAL | 0 refills | Status: AC

## 2023-07-04 MED ORDER — LACTULOSE 10 GM/15ML PO SOLN: 30.0000 g | Freq: Three times a day (TID) | ORAL | 11 refills | Status: AC

## 2023-07-04 NOTE — Progress Notes (Signed)
 Wyline Mood MD, MRCP(U.K) [address]  [address]  [place], [place] [address]  Main: [phone]  Fax: [phone]   Gastroenterology Consultation  Referring Provider:     [facility] Primary Care Physician:  [facility] Primary Gastroenterologist:  Dr. Wyline Mood  Reason for Consultation:     cirrhosis        HPI:   Monique Edwards is a 58 y.o. y/o female referred for liver cirrhosis Summary of history :   She was last seen back in 04/2020:  Prior history of cirrhosis likely from hepatitis C virus and possibly alcoholic fatty liver disease.Achieved SVR after treatment. Cured in 2015 . She was compensated in terms of her cirrhosis back in 2017. EGD in 2017 showed PHG and no varices. Last colonoscopy in 2017 and two small adenomas excised 06/06/2018: EGD:  No varices- repeat in 2023 05/23/2018: RUQUSG: no HCC 05/2018: no hcv virus in blood    Interval history 2020--2025    Has not followed up since 2021 No recent labs. 09/21/2022 right upper quadrant ultrasound shows features of cirrhosis She is here today with her daughter.  She has had issues with memory and inversion of his sleep rhythms feels sleepy during the daytime.  Has a bowel movement every day but she has to strain thinks he may be constipated.  Taking her rifaximin every day.  Denies any NSAID use.  She consumes alcohol every other day 2 quarts of beer.  Continues to smoke.  Past Medical History:  Diagnosis Date   Arthritis    COPD (chronic obstructive pulmonary disease) (HCC)    Fatty liver    GERD (gastroesophageal reflux disease)    Hypertension     Past Surgical History:  Procedure Laterality Date   CHOLECYSTECTOMY     ESOPHAGOGASTRODUODENOSCOPY (EGD) WITH PROPOFOL N/A 06/06/2018   Procedure: ESOPHAGOGASTRODUODENOSCOPY (EGD) WITH PROPOFOL;  Surgeon: Wyline Mood, MD;  Location: [facility] ENDOSCOPY;  Service: Gastroenterology;  Laterality: N/A;    Prior to Admission  medications   Medication Sig Start Date End Date Taking? Authorizing Provider  ADVAIR DISKUS 250-50 MCG/ACT AEPB Inhale 1 puff into the lungs in the morning and at bedtime. 06/20/23   Raechel Chute, MD  albuterol (PROVENTIL HFA) 108 (90 Base) MCG/ACT inhaler Inhale 2 puffs into the lungs every 6 (six) hours as needed for wheezing or shortness of breath. 08/14/22   Raechel Chute, MD  cetirizine (ZYRTEC) 10 MG tablet Take 1 tablet (10 mg total) by mouth once daily. 05/23/21   Iloabachie, Chioma E, NP  citalopram (CELEXA) 10 MG tablet Take 10 mg by mouth daily. 02/26/22   [provider]  citalopram (CELEXA) 20 MG tablet Take 20 mg by mouth daily. 05/24/22   [provider]  fluticasone (FLONASE) 50 MCG/ACT nasal spray Spray 1 spray into both nostrils once daily. 03/08/21   Iloabachie, Chioma E, NP  ibuprofen (ADVIL) 800 MG tablet Take 800 mg by mouth 3 (three) times daily. 05/25/22   [provider]  losartan (COZAAR) 25 MG tablet TAKE ONE TABLET BY MOUTH ONCE EVERY DAY. 03/28/21 02/20/23  Iloabachie, Chioma E, NP  omeprazole (PRILOSEC) 20 MG capsule Take 1 capsule (20mg ) by mouth once every day. Patient taking differently: Take 20 mg by mouth daily. 05/23/21 02/20/23  Iloabachie, Chioma E, NP  rifaximin (XIFAXAN) 550 MG TABS tablet Take 1 tablet (550 mg total) by mouth 2 (two) times daily. 07/17/22   Wyline Mood, MD  Roflumilast 250 MCG TABS  Take 1 tablet by mouth once daily 04/11/23   Parrett, Tammy S, NP  umeclidinium bromide (INCRUSE ELLIPTA) 62.5 MCG/ACT AEPB Inhale 1 puff into the lungs daily. 08/14/22   Raechel Chute, MD    Family History  Problem Relation Age of Onset   Hypertension Mother    Stroke Mother    Diabetes Father    Arthritis Sister    Thyroid disease Sister    Hypertension Brother    Hypertension Brother      Social History   Tobacco Use   Smoking status: Some Days    Types: Cigarettes    Start date: 11/04/1980   Smokeless tobacco: Never   Tobacco  comments:    1 pack per week--08/14/2022  Vaping Use   Vaping status: Never Used  Substance Use Topics   Alcohol use: Yes    Alcohol/week: 5.0 standard drinks of alcohol    Types: 5 Cans of beer per week    Comment: 2-3 beers every few weeks   Drug use: Not Currently    Types: Cocaine    Comment: last use 2021    Allergies as of 07/04/2023 - Review Complete 02/20/2023  Allergen Reaction Noted   Amlodipine Swelling 06/10/2018   Lisinopril Cough 03/17/2018    Review of Systems:    All systems reviewed and negative except where noted in HPI.   Physical Exam:  LMP 02/28/2014 (Approximate)  Patient's last menstrual period was 02/28/2014 (approximate). Psych:  Alert and cooperative. Normal mood and affect. General:   Alert,  Well-developed, well-nourished, pleasant and cooperative in NAD Head:  Normocephalic and atraumatic. Eyes:  Sclera clear, no icterus.   Conjunctiva pink. Ears:  Normal auditory acuity. Neurologic:  Alert and oriented x3;  grossly normal neurologically. Psych:  Alert and cooperative. Normal mood and affect.  Imaging Studies: No results found.  Assessment and Plan:   Monique Edwards is a 58 y.o. y/o female has been referred here for cirrhosis of the liver  , hepatitis C cured in 2015 . Not followed up since 2021.  Presently she continues to drink alcohol and smoke cigarettes.  She has a history suggestive of hepatic encephalopathy.  History of constipation.     Plan  1. EGD to screen for Varices along with surveillance colonoscopy due to prior history of colon polyps 2. Labs to calculate MELD score, AFP 3. RUQ USG to screen for HCC 4. Check Hep A/B vaccine status and immunize as needed. 5.  Continue on Xifaxan for hepatic encephalopathy 6. Commence on lactulose and titrate to two soft bowel movements per day  7.  Advised to maintain appointments follow-up regularly 8.  She has symptoms of reflux refill on omeprazole advised to lose weight stop smoking  alcohol   I have discussed alternative options, risks & benefits,  which include, but are not limited to, bleeding, infection, perforation,respiratory complication & drug reaction.  The patient agrees with this plan & written consent will be obtained.     Follow up in 6 months  Dr Wyline Mood MD,MRCP(U.K)

## 2023-07-04 NOTE — Patient Instructions (Addendum)
 Please arrive at the [facility]:  Address: [address] [place], [place] [address] Phone: [phone]  Please arrive 15 minutes earlier. Nothing to eat or drink after midnight the night before.  If you need to reschedule, please call [phone].

## 2023-07-05 LAB — AFP TUMOR MARKER: AFP, Serum, Tumor Marker: 2.3 ng/mL (ref 0.0–9.2)

## 2023-07-05 LAB — CBC WITH DIFFERENTIAL/PLATELET

## 2023-07-06 LAB — COMPREHENSIVE METABOLIC PANEL
ALT: 14 IU/L (ref 0–32)
AST: 19 IU/L (ref 0–40)
Albumin: 4 g/dL (ref 3.8–4.9)
Alkaline Phosphatase: 76 IU/L (ref 44–121)
BUN/Creatinine Ratio: 19 (ref 9–23)
BUN: 19 mg/dL (ref 6–24)
Bilirubin Total: 0.2 mg/dL (ref 0.0–1.2)
CO2: 23 mmol/L (ref 20–29)
Calcium: 8.9 mg/dL (ref 8.7–10.2)
Chloride: 108 mmol/L — ABNORMAL HIGH (ref 96–106)
Creatinine, Ser: 1 mg/dL (ref 0.57–1.00)
Globulin, Total: 2.3 g/dL (ref 1.5–4.5)
Glucose: 87 mg/dL (ref 70–99)
Potassium: 4.3 mmol/L (ref 3.5–5.2)
Sodium: 144 mmol/L (ref 134–144)
Total Protein: 6.3 g/dL (ref 6.0–8.5)
eGFR: 65 mL/min/{1.73_m2} (ref >59)

## 2023-07-06 LAB — CBC WITH DIFFERENTIAL/PLATELET
Basophils Absolute: 0 10*3/uL (ref 0.0–0.2)
Basos: 1 %
EOS (ABSOLUTE): 0.1 10*3/uL (ref 0.0–0.4)
Eos: 2 %
Hematocrit: 35.4 % (ref 34.0–46.6)
Hemoglobin: 11.9 g/dL (ref 11.1–15.9)
Immature Grans (Abs): 0 10*3/uL (ref 0.0–0.1)
Immature Granulocytes: 1 %
Lymphocytes Absolute: 1.4 10*3/uL (ref 0.7–3.1)
Lymphs: 34 %
MCH: 30.1 pg (ref 26.6–33.0)
MCHC: 33.6 g/dL (ref 31.5–35.7)
MCV: 89 fL (ref 79–97)
Monocytes Absolute: 0.3 10*3/uL (ref 0.1–0.9)
Monocytes: 8 %
Neutrophils Absolute: 2.2 10*3/uL (ref 1.4–7.0)
Neutrophils: 54 %
Platelets: 84 10*3/uL — CL (ref 150–450)
RBC: 3.96 x10E6/uL (ref 3.77–5.28)
RDW: 12.6 % (ref 11.7–15.4)
WBC: 4.1 10*3/uL (ref 3.4–10.8)

## 2023-07-06 LAB — PROTIME-INR
INR: 1 (ref 0.9–1.2)
Prothrombin Time: 10.9 s (ref 9.1–12.0)

## 2023-07-06 LAB — HEPATITIS A ANTIBODY, TOTAL: hep A Total Ab: POSITIVE — AB

## 2023-07-06 LAB — HEPATITIS B SURFACE ANTIBODY,QUALITATIVE: Hep B Surface Ab, Qual: NONREACTIVE

## 2023-07-10 ENCOUNTER — Telehealth: Payer: Self-pay

## 2023-07-10 NOTE — Telephone Encounter (Signed)
 Called patient to let her know that she is needing to come in and have the vaccine Hepatitis B and patient agreed. Patient was also informed that it was a series of three 0, 1, 6 months. Patient understood and had no further questions.

## 2023-07-10 NOTE — Telephone Encounter (Signed)
-----   Message from Wyline Mood sent at 07/06/2023  8:35 AM EST ----- Needs hep b vaccine

## 2023-07-11 ENCOUNTER — Ambulatory Visit: Payer: 59

## 2023-07-11 ENCOUNTER — Telehealth: Payer: Self-pay | Admitting: Gastroenterology

## 2023-07-11 ENCOUNTER — Ambulatory Visit

## 2023-07-11 NOTE — Telephone Encounter (Signed)
 The patient call in to reschedule her nurse visit and her ultrasound.

## 2023-07-16 NOTE — Telephone Encounter (Signed)
 Called patient back and had to leave her a voicemail letting her know that I was returning her call again and if she needed to reschedule her nurse visit to call us back and that the receptionists could reschedule it for her and for her ultrasound, she will need to call [phone].

## 2023-07-18 ENCOUNTER — Ambulatory Visit

## 2023-07-19 ENCOUNTER — Other Ambulatory Visit: Payer: Self-pay | Admitting: Gastroenterology

## 2023-07-22 ENCOUNTER — Ambulatory Visit: Admitting: Gastroenterology

## 2023-07-22 DIAGNOSIS — Z23 Encounter for immunization: Secondary | ICD-10-CM | POA: Diagnosis not present

## 2023-07-22 MED ORDER — RIFAXIMIN 550 MG PO TABS: 550.0000 mg | ORAL_TABLET | Freq: Two times a day (BID) | ORAL | 11 refills | Status: AC

## 2023-07-25 ENCOUNTER — Ambulatory Visit: Admitting: General Practice

## 2023-07-25 ENCOUNTER — Encounter
Admission: RE | Disposition: A | Discharge status: Home / Self Care | Payer: Self-pay | Source: Home / Self Care | Attending: Gastroenterology

## 2023-07-25 ENCOUNTER — Other Ambulatory Visit: Payer: Self-pay

## 2023-07-25 ENCOUNTER — Encounter: Payer: Self-pay | Admitting: Gastroenterology

## 2023-07-25 ENCOUNTER — Ambulatory Visit
Admission: RE | Admit: 2023-07-25 | Discharge: 2023-07-25 | Disposition: A | Discharge status: Home / Self Care | Payer: 59 | Attending: Gastroenterology | Admitting: Gastroenterology

## 2023-07-25 DIAGNOSIS — I10 Essential (primary) hypertension: Secondary | ICD-10-CM | POA: Insufficient documentation

## 2023-07-25 DIAGNOSIS — I8500 Esophageal varices without bleeding: Secondary | ICD-10-CM

## 2023-07-25 DIAGNOSIS — J449 Chronic obstructive pulmonary disease, unspecified: Secondary | ICD-10-CM | POA: Insufficient documentation

## 2023-07-25 DIAGNOSIS — D123 Benign neoplasm of transverse colon: Secondary | ICD-10-CM | POA: Diagnosis not present

## 2023-07-25 DIAGNOSIS — K7460 Unspecified cirrhosis of liver: Secondary | ICD-10-CM | POA: Diagnosis not present

## 2023-07-25 DIAGNOSIS — D124 Benign neoplasm of descending colon: Secondary | ICD-10-CM | POA: Insufficient documentation

## 2023-07-25 DIAGNOSIS — F17200 Nicotine dependence, unspecified, uncomplicated: Secondary | ICD-10-CM | POA: Insufficient documentation

## 2023-07-25 DIAGNOSIS — K219 Gastro-esophageal reflux disease without esophagitis: Secondary | ICD-10-CM | POA: Diagnosis not present

## 2023-07-25 DIAGNOSIS — Z1211 Encounter for screening for malignant neoplasm of colon: Secondary | ICD-10-CM

## 2023-07-25 DIAGNOSIS — D126 Benign neoplasm of colon, unspecified: Secondary | ICD-10-CM

## 2023-07-25 DIAGNOSIS — Z8719 Personal history of other diseases of the digestive system: Secondary | ICD-10-CM

## 2023-07-25 DIAGNOSIS — D122 Benign neoplasm of ascending colon: Secondary | ICD-10-CM | POA: Diagnosis not present

## 2023-07-25 DIAGNOSIS — Z860100 Personal history of colon polyps, unspecified: Secondary | ICD-10-CM

## 2023-07-25 HISTORY — PX: COLONOSCOPY WITH PROPOFOL: SHX5780

## 2023-07-25 HISTORY — PX: HEMOSTASIS CLIP PLACEMENT: SHX6857

## 2023-07-25 HISTORY — PX: SUBMUCOSAL INJECTION: SHX5543

## 2023-07-25 HISTORY — DX: Unspecified cirrhosis of liver: K74.60

## 2023-07-25 HISTORY — PX: POLYPECTOMY: SHX5525

## 2023-07-25 HISTORY — PX: ESOPHAGOGASTRODUODENOSCOPY: SHX5428

## 2023-07-25 SURGERY — COLONOSCOPY WITH PROPOFOL
Anesthesia: General

## 2023-07-25 MED ORDER — SODIUM CHLORIDE 0.9 % IV SOLN: INTRAVENOUS | Status: DC

## 2023-07-25 MED ORDER — PROPOFOL 10 MG/ML IV BOLUS
INTRAVENOUS | Status: AC
Start: 2023-07-25 — End: ?
  Filled 2023-07-25: qty 20

## 2023-07-25 MED ORDER — SIMETHICONE 40 MG/0.6ML PO SUSP
ORAL | Status: DC | PRN
  Administered 2023-07-25: 120 mL

## 2023-07-25 MED ORDER — PROPOFOL 10 MG/ML IV BOLUS
INTRAVENOUS | Status: AC
  Filled 2023-07-25: qty 20

## 2023-07-25 MED ORDER — IPRATROPIUM-ALBUTEROL 0.5-2.5 (3) MG/3ML IN SOLN
3.0000 mL | RESPIRATORY_TRACT | Status: AC | PRN
  Administered 2023-07-25: 3 mL via RESPIRATORY_TRACT

## 2023-07-25 MED ORDER — SODIUM CHLORIDE 0.9% FLUSH
INTRAVENOUS | Status: DC | PRN
  Administered 2023-07-25: 7 mL via INTRAVENOUS

## 2023-07-25 MED ORDER — IPRATROPIUM-ALBUTEROL 0.5-2.5 (3) MG/3ML IN SOLN
RESPIRATORY_TRACT | Status: AC
  Filled 2023-07-25: qty 3

## 2023-07-25 MED ORDER — LIDOCAINE HCL (PF) 2 % IJ SOLN
INTRAMUSCULAR | Status: DC | PRN
  Administered 2023-07-25: 40 mg via INTRADERMAL

## 2023-07-25 MED ORDER — PROPOFOL 10 MG/ML IV BOLUS
INTRAVENOUS | Status: DC | PRN
  Administered 2023-07-25 (×4): 30 mg via INTRAVENOUS
  Administered 2023-07-25: 40 mg via INTRAVENOUS
  Administered 2023-07-25 (×2): 30 mg via INTRAVENOUS
  Administered 2023-07-25: 50 mg via INTRAVENOUS
  Administered 2023-07-25 (×8): 30 mg via INTRAVENOUS

## 2023-07-25 NOTE — Transfer of Care (Signed)
 Immediate Anesthesia Transfer of Care Note  Patient: Monique Edwards  Procedure(s) Performed: COLONOSCOPY WITH PROPOFOL EGD (ESOPHAGOGASTRODUODENOSCOPY) POLYPECTOMY CONTROL OF HEMORRHAGE, GI TRACT, ENDOSCOPIC, BY CLIPPING OR OVERSEWING  Patient Location: PACU and Endoscopy Unit  Anesthesia Type:MAC  Level of Consciousness: drowsy  Airway & Oxygen Therapy: Patient Spontanous Breathing and Patient connected to nasal cannula oxygen  Post-op Assessment: Report given to RN and Post -op Vital signs reviewed and stable  Post vital signs: Reviewed and stable  Last Vitals:  Vitals Value Taken Time  BP 115/76   Temp    Pulse 71 07/25/23 1126  Resp 18 07/25/23 1126  SpO2 97 % 07/25/23 1126  Vitals shown include unfiled device data.  Last Pain:  Vitals:   07/25/23 0951  TempSrc: Temporal  PainSc: 0-No pain         Complications: No notable events documented.

## 2023-07-25 NOTE — Op Note (Signed)
 [facility] Gastroenterology Patient Name: Monique Edwards Procedure Date: 07/25/2023 10:31 AM MRN: [id number] Account #: [id number] Date of Birth: 1965/11/02 Admit Type: Outpatient Age: 58 Room: [facility] ENDO ROOM 3 Gender: Female Note Status: Finalized Instrument Name: Prentice Docker [id number] Procedure:             Colonoscopy Indications:           Surveillance: Personal history of adenomatous polyps                         on last colonoscopy > 3 years ago Providers:             Wyline Mood MD, MD Referring MD:          [facility] (Referring MD) Medicines:             Monitored Anesthesia Care Complications:         No immediate complications. Procedure:             Pre-Anesthesia Assessment:                        - Prior to the procedure, a History and Physical was                         performed, and patient medications, allergies and                         sensitivities were reviewed. The patient's tolerance                         of previous anesthesia was reviewed.                        - The risks and benefits of the procedure and the                         sedation options and risks were discussed with the                         patient. All questions were answered and informed                         consent was obtained.                        - ASA Grade Assessment: II - A patient with mild                         systemic disease.                        After obtaining informed consent, the colonoscope was                         passed under direct vision. Throughout the procedure,                         the patient's blood pressure, pulse, and oxygen  saturations were monitored continuously. The                         Colonoscope was introduced through the anus and                         advanced to the the cecum, identified by the                         appendiceal orifice. The colonoscopy was  performed                         without difficulty. The patient tolerated the                         procedure well. The quality of the bowel preparation                         was excellent. The ileocecal valve, appendiceal                         orifice, and rectum were photographed. Findings:      The perianal and digital rectal examinations were normal.      A 20 mm polyp was found in the proximal ascending colon. The polyp was       sessile. The polyp was removed with a hot snare. The polyp was removed       with a saline injection-lift technique using a hot snare. Resection and       retrieval were complete. To prevent bleeding after the polypectomy,       three hemostatic clips were successfully placed. Clip manufacturer:       [employer]. There was no bleeding at the end of the procedure.      Seven sessile polyps were found in the ascending colon. The polyps were       6 to 10 mm in size. These polyps were removed with a cold snare.       Resection and retrieval were complete. To prevent bleeding       post-intervention, one hemostatic clip was successfully placed. Clip       manufacturer: [employer]. There was no bleeding at the end of the       procedure.      An 8 mm polyp was found in the descending colon. The polyp was sessile.       The polyp was removed with a cold snare. Resection and retrieval were       complete. To prevent bleeding post-intervention, one hemostatic clip was       successfully placed. Clip manufacturer: [employer]. There was no       bleeding at the end of the procedure.      The exam was otherwise without abnormality on direct and retroflexion       views. Impression:            - One 20 mm polyp in the proximal ascending colon,                         removed with a hot snare and removed using  injection-lift and a hot snare. Resected and                         retrieved. Clips were placed. Clip  manufacturer:                         [employer].                        - Seven 6 to 10 mm polyps in the ascending colon,                         removed with a cold snare. Resected and retrieved.                         Clip was placed. Clip manufacturer: [employer].                        - One 8 mm polyp in the descending colon, removed with                         a cold snare. Resected and retrieved. Clip was placed.                         Clip manufacturer: [employer].                        - The examination was otherwise normal on direct and                         retroflexion views. Recommendation:        - Discharge patient to home (with escort).                        - Resume previous diet.                        - Continue present medications.                        - Await pathology results.                        - Repeat colonoscopy in 1 year for surveillance. Procedure Code(s):     --- Professional ---                        [id number], Colonoscopy, flexible; with removal of                         tumor(s), polyp(s), or other lesion(s) by snare                         technique                        [id number], Colonoscopy, flexible; with directed submucosal                         injection(s), any substance Diagnosis Code(s):     --- Professional ---  Z86.010, Personal history of colonic polyps                        D12.2, Benign neoplasm of ascending colon                        D12.4, Benign neoplasm of descending colon CPT copyright 2022 American Medical Association. All rights reserved. The codes documented in this report are preliminary and upon coder review may  be revised to meet current compliance requirements. Wyline Mood, MD Wyline Mood MD, MD 07/25/2023 11:26:43 AM This report has been signed electronically. Number of Addenda: 0 Note Initiated On: 07/25/2023 10:31 AM Scope Withdrawal Time: 0 hours 26 minutes 51 seconds   Total Procedure Duration: 0 hours 29 minutes 57 seconds  Estimated Blood Loss:  Estimated blood loss: none.      [facility]

## 2023-07-25 NOTE — Anesthesia Postprocedure Evaluation (Signed)
 Anesthesia Post Note  Patient: Rayetta Kenealy  Procedure(s) Performed: COLONOSCOPY WITH PROPOFOL EGD (ESOPHAGOGASTRODUODENOSCOPY) POLYPECTOMY CONTROL OF HEMORRHAGE, GI TRACT, ENDOSCOPIC, BY CLIPPING OR OVERSEWING  Patient location during evaluation: Endoscopy Anesthesia Type: General Level of consciousness: awake and alert Pain management: pain level controlled Vital Signs Assessment: post-procedure vital signs reviewed and stable Respiratory status: spontaneous breathing, nonlabored ventilation, respiratory function stable and patient connected to nasal cannula oxygen Cardiovascular status: blood pressure returned to baseline and stable Postop Assessment: no apparent nausea or vomiting Anesthetic complications: no  No notable events documented.   Last Vitals:  Vitals:   07/25/23 0951 07/25/23 1125  BP: (!) 146/87 115/76  Pulse: 60 71  Resp: 16 (!) 21  Temp: (!) 36.4 C (!) 36.1 C  SpO2: 98% 97%    Last Pain:  Vitals:   07/25/23 1125  TempSrc:   PainSc: 0-No pain                 Stephanie Coup

## 2023-07-25 NOTE — H&P (Signed)
 Wyline Mood, MD [address], [place], [place], [address] [address], [address], [place], [place], [address] Phone: [phone]  Fax: [phone]  Primary Care Physician:  [facility]   Pre-Procedure History & Physical: HPI:  Monique Edwards is a 58 y.o. female is here for an endoscopy and colonoscopy    Past Medical History:  Diagnosis Date   Arthritis    COPD (chronic obstructive pulmonary disease) (HCC)    Fatty liver    GERD (gastroesophageal reflux disease)    Hypertension     Past Surgical History:  Procedure Laterality Date   CHOLECYSTECTOMY     ESOPHAGOGASTRODUODENOSCOPY (EGD) WITH PROPOFOL N/A 06/06/2018   Procedure: ESOPHAGOGASTRODUODENOSCOPY (EGD) WITH PROPOFOL;  Surgeon: Wyline Mood, MD;  Location: [facility] ENDOSCOPY;  Service: Gastroenterology;  Laterality: N/A;    Prior to Admission medications   Medication Sig Start Date End Date Taking? Authorizing Provider  ADVAIR DISKUS 250-50 MCG/ACT AEPB Inhale 1 puff into the lungs in the morning and at bedtime. 06/20/23   Raechel Chute, MD  albuterol (PROVENTIL HFA) 108 (90 Base) MCG/ACT inhaler Inhale 2 puffs into the lungs every 6 (six) hours as needed for wheezing or shortness of breath. 08/14/22   Raechel Chute, MD  cetirizine (ZYRTEC) 10 MG tablet Take 1 tablet (10 mg total) by mouth once daily. 05/23/21   Iloabachie, Chioma E, NP  citalopram (CELEXA) 10 MG tablet Take 10 mg by mouth daily. 02/26/22   [provider]  citalopram (CELEXA) 20 MG tablet Take 20 mg by mouth daily. 05/24/22   [provider]  fluticasone (FLONASE) 50 MCG/ACT nasal spray Spray 1 spray into both nostrils once daily. 03/08/21   Iloabachie, Chioma E, NP  ibuprofen (ADVIL) 800 MG tablet Take 800 mg by mouth 3 (three) times daily. 05/25/22   [provider]  lactulose (CHRONULAC) 10 GM/15ML solution Take 45 mLs (30 g total) by mouth 3 (three) times daily. 07/04/23   Wyline Mood, MD   losartan (COZAAR) 25 MG tablet TAKE ONE TABLET BY MOUTH ONCE EVERY DAY. 03/28/21 02/20/23  Iloabachie, Chioma E, NP  omeprazole (PRILOSEC) 40 MG capsule Take 1 capsule (40 mg total) by mouth daily. 07/04/23   Wyline Mood, MD  rifaximin (XIFAXAN) 550 MG TABS tablet Take 1 tablet (550 mg total) by mouth 2 (two) times daily. 07/22/23   Wyline Mood, MD  Roflumilast 250 MCG TABS Take 1 tablet by mouth once daily 04/11/23   Parrett, Tammy S, NP  umeclidinium bromide (INCRUSE ELLIPTA) 62.5 MCG/ACT AEPB Inhale 1 puff into the lungs daily. 08/14/22   Raechel Chute, MD  XIFAXAN 550 MG TABS tablet Take 1 tablet by mouth twice daily 07/23/23   Wyline Mood, MD    Allergies as of 07/04/2023 - Review Complete 07/04/2023  Allergen Reaction Noted   Amlodipine Swelling 06/10/2018   Lisinopril Cough 03/17/2018    Family History  Problem Relation Age of Onset   Hypertension Mother    Stroke Mother    Diabetes Father    Arthritis Sister    Thyroid disease Sister    Hypertension Brother    Hypertension Brother     Social History   Socioeconomic History   Marital status: Widowed    Spouse name: Not on file   Number of children: Not on file   Years of education: Not on file   Highest education level: Not on file  Occupational History   Occupation: Unemployed  Tobacco Use  Smoking status: Some Days    Types: Cigarettes    Start date: 11/04/1980   Smokeless tobacco: Never   Tobacco comments:    1 pack per week--08/14/2022  Vaping Use   Vaping status: Never Used  Substance and Sexual Activity   Alcohol use: Yes    Alcohol/week: 5.0 standard drinks of alcohol    Types: 5 Cans of beer per week    Comment: 2-3 beers every few weeks   Drug use: Not Currently    Types: Cocaine    Comment: last use 2021   Sexual activity: Yes    Partners: Male  Other Topics Concern   Not on file  Social History Narrative   Not on file   Social Drivers of Health   Financial Resource Strain: Not on file  Food  Insecurity: No Food Insecurity (08/27/2022)   Hunger Vital Sign    Worried About Running Out of Food in the Last Year: Never true    Ran Out of Food in the Last Year: Never true  Transportation Needs: No Transportation Needs (08/27/2022)   PRAPARE - Administrator, Civil Service (Medical): No    Lack of Transportation (Non-Medical): No  Physical Activity: Not on file  Stress: Not on file  Social Connections: Not on file  Intimate Partner Violence: Not on file    Review of Systems: See HPI, otherwise negative ROS  Physical Exam: LMP 02/28/2014 (Approximate)  General:   Alert,  pleasant and cooperative in NAD Head:  Normocephalic and atraumatic. Neck:  Supple; no masses or thyromegaly. Lungs:  Clear throughout to auscultation, normal respiratory effort.    Heart:  +S1, +S2, Regular rate and rhythm, No edema. Abdomen:  Soft, nontender and nondistended. Normal bowel sounds, without guarding, and without rebound.   Neurologic:  Alert and  oriented x4;  grossly normal neurologically.  Impression/Plan: Monique Edwards is here for an endoscopy and colonoscopy  to be performed for  evaluation of esophageal varices screening + colon polyps history surveillance.     Risks, benefits, limitations, and alternatives regarding endoscopy have been reviewed with the patient.  Questions have been answered.  All parties agreeable.   Wyline Mood, MD  07/25/2023, 9:37 AM

## 2023-07-25 NOTE — Anesthesia Preprocedure Evaluation (Signed)
 Anesthesia Evaluation  Patient identified by MRN, date of birth, ID band Patient awake    Reviewed: Allergy & Precautions, NPO status , Patient's Chart, lab work & pertinent test results, reviewed documented beta blocker date and time   Airway Mallampati: III  TM Distance: >3 FB Neck ROM: full    Dental  (+) Chipped   Pulmonary COPD, Current Smoker and Patient abstained from smoking.   Pulmonary exam normal        Cardiovascular hypertension, Pt. on medications Normal cardiovascular exam     Neuro/Psych  PSYCHIATRIC DISORDERS  Depression    negative neurological ROS     GI/Hepatic Neg liver ROS,GERD  ,,  Endo/Other  negative endocrine ROS  Class 3 obesity  Renal/GU negative Renal ROS  negative genitourinary   Musculoskeletal   Abdominal   Peds  Hematology negative hematology ROS (+)   Anesthesia Other Findings Past Medical History: No date: Arthritis No date: Cirrhosis of liver (HCC) No date: COPD (chronic obstructive pulmonary disease) (HCC) No date: Fatty liver No date: GERD (gastroesophageal reflux disease) No date: Hypertension  Past Surgical History: No date: CHOLECYSTECTOMY 06/06/2018: ESOPHAGOGASTRODUODENOSCOPY (EGD) WITH PROPOFOL; N/A     Comment:  Procedure: ESOPHAGOGASTRODUODENOSCOPY (EGD) WITH               PROPOFOL;  Surgeon: Wyline Mood, MD;  Location: [facility]               ENDOSCOPY;  Service: Gastroenterology;  Laterality: N/A;  BMI    Body Mass Index: 36.95 kg/m      Reproductive/Obstetrics negative OB ROS                             Anesthesia Physical Anesthesia Plan  ASA: 3  Anesthesia Plan: General   Post-op Pain Management: Minimal or no pain anticipated   Induction: Intravenous  PONV Risk Score and Plan: 3 and Propofol infusion, TIVA and Ondansetron  Airway Management Planned: Nasal Cannula  Additional Equipment: None  Intra-op Plan:    Post-operative Plan:   Informed Consent: I have reviewed the patients History and Physical, chart, labs and discussed the procedure including the risks, benefits and alternatives for the proposed anesthesia with the patient or authorized representative who has indicated his/her understanding and acceptance.     Dental advisory given  Plan Discussed with: CRNA and Surgeon  Anesthesia Plan Comments: (Discussed risks of anesthesia with patient, including possibility of difficulty with spontaneous ventilation under anesthesia necessitating airway intervention, PONV, and rare risks such as cardiac or respiratory or neurological events, and allergic reactions. Discussed the role of CRNA in patient's perioperative care. Patient understands.)       Anesthesia Quick Evaluation

## 2023-07-25 NOTE — Op Note (Signed)
 [facility] Gastroenterology Patient Name: Monique Edwards Procedure Date: 07/25/2023 10:33 AM MRN: [id number] Account #: [id number] Date of Birth: 1965/09/23 Admit Type: Outpatient Age: 58 Room: [facility] ENDO ROOM 3 Gender: Female Note Status: Finalized Instrument Name: Upper Endoscope [id number] Procedure:             Upper GI endoscopy Indications:           Cirrhosis rule out esophageal varices Providers:             Wyline Mood MD, MD Referring MD:          [facility] (Referring MD) Medicines:             Monitored Anesthesia Care Complications:         No immediate complications. Procedure:             Pre-Anesthesia Assessment:                        - Prior to the procedure, a History and Physical was                         performed, and patient medications, allergies and                         sensitivities were reviewed. The patient's tolerance                         of previous anesthesia was reviewed.                        - The risks and benefits of the procedure and the                         sedation options and risks were discussed with the                         patient. All questions were answered and informed                         consent was obtained.                        - ASA Grade Assessment: II - A patient with mild                         systemic disease.                        After obtaining informed consent, the endoscope was                         passed under direct vision. Throughout the procedure,                         the patient's blood pressure, pulse, and oxygen                         saturations were monitored continuously. The Endoscope  was introduced through the mouth, and advanced to the                         third part of duodenum. The upper GI endoscopy was                         accomplished with ease. The patient tolerated the                         procedure  well. Findings:      The esophagus was normal.      The stomach was normal.      The examined duodenum was normal. Impression:            - Normal esophagus.                        - Normal stomach.                        - Normal examined duodenum.                        - No specimens collected. Recommendation:        - Perform a colonoscopy today. Procedure Code(s):     --- Professional ---                        [phone], Esophagogastroduodenoscopy, flexible,                         transoral; diagnostic, including collection of                         specimen(s) by brushing or washing, when performed                         (separate procedure) Diagnosis Code(s):     --- Professional ---                        K74.60, Unspecified cirrhosis of liver CPT copyright 2022 American Medical Association. All rights reserved. The codes documented in this report are preliminary and upon coder review may  be revised to meet current compliance requirements. Wyline Mood, MD Wyline Mood MD, MD 07/25/2023 10:48:57 AM This report has been signed electronically. Number of Addenda: 0 Note Initiated On: 07/25/2023 10:33 AM Estimated Blood Loss:  Estimated blood loss: none.      [facility]

## 2023-07-26 LAB — SURGICAL PATHOLOGY

## 2023-07-29 ENCOUNTER — Encounter: Payer: Self-pay | Admitting: Gastroenterology

## 2023-08-01 ENCOUNTER — Other Ambulatory Visit: Payer: Self-pay

## 2023-08-01 ENCOUNTER — Ambulatory Visit

## 2023-08-07 ENCOUNTER — Other Ambulatory Visit: Payer: Self-pay | Admitting: Adult Health

## 2023-08-07 DIAGNOSIS — J441 Chronic obstructive pulmonary disease with (acute) exacerbation: Secondary | ICD-10-CM

## 2023-08-12 NOTE — Progress Notes (Signed)
Pt here for  Hep B vaccine

## 2023-08-19 NOTE — Progress Notes (Signed)
 Done

## 2023-08-27 ENCOUNTER — Ambulatory Visit (INDEPENDENT_AMBULATORY_CARE_PROVIDER_SITE_OTHER): Admitting: Gastroenterology

## 2023-08-27 DIAGNOSIS — Z23 Encounter for immunization: Secondary | ICD-10-CM | POA: Diagnosis not present

## 2023-08-28 NOTE — Progress Notes (Signed)
 Pt here for immunization

## 2023-08-30 ENCOUNTER — Ambulatory Visit: Attending: Gastroenterology

## 2023-09-03 ENCOUNTER — Ambulatory Visit: Admitting: Family Medicine

## 2023-09-24 ENCOUNTER — Other Ambulatory Visit: Payer: Self-pay | Admitting: Student in an Organized Health Care Education/Training Program

## 2023-09-24 DIAGNOSIS — Z8709 Personal history of other diseases of the respiratory system: Secondary | ICD-10-CM

## 2023-10-02 ENCOUNTER — Ambulatory Visit: Payer: Self-pay | Admitting: Nurse Practitioner

## 2023-10-02 ENCOUNTER — Other Ambulatory Visit: Payer: Self-pay

## 2023-10-02 DIAGNOSIS — J42 Unspecified chronic bronchitis: Secondary | ICD-10-CM

## 2023-10-02 MED ORDER — PREDNISONE 20 MG PO TABS: 40.0000 mg | ORAL_TABLET | Freq: Every day | ORAL | 0 refills | Status: DC

## 2023-10-02 MED ORDER — PREDNISONE 20 MG PO TABS: 40.0000 mg | ORAL_TABLET | Freq: Every day | ORAL | 0 refills | Status: AC

## 2023-10-02 MED ORDER — AMOXICILLIN-POT CLAVULANATE 875-125 MG PO TABS: 1.0000 | ORAL_TABLET | Freq: Two times a day (BID) | ORAL | 0 refills | Status: AC

## 2023-10-02 MED ORDER — AMOXICILLIN-POT CLAVULANATE 875-125 MG PO TABS
1.0000 | ORAL_TABLET | Freq: Two times a day (BID) | ORAL | 0 refills | Status: DC
Start: 2023-10-02 — End: 2023-10-02

## 2023-10-02 MED ORDER — AMOXICILLIN-POT CLAVULANATE 875-125 MG PO TABS: 1.0000 | ORAL_TABLET | Freq: Two times a day (BID) | ORAL | 0 refills | Status: DC

## 2023-10-02 NOTE — Telephone Encounter (Signed)
 Chief Complaint: Shortness of breath x1 week  Symptoms: Wheezing, productive cough, yellow or grey mucous, nasal congestion, heaviness in chest, elevated BP (current 180/92) Frequency: Intermittent Pertinent Negatives: Patient denies chest pain  Disposition: [x] ED / [x] Refused Recommended Disposition   Additional Notes: Patient current BP is 180/92 and HR 71. This RN advised pt to go to ED based off elevated BP with other symptoms and pt refused.  This RN notified [facility] of pt refusal of ED disposition.    Copied from CRM [id number]. Topic: Clinical - Red Word Triage >> Oct 02, 2023  2:04 PM Tyronne Galloway wrote: Red Word that prompted transfer to Nurse Triage: Pt sees Dr. Darnelle Elders and has been experiencing coughing, wheezing, stuffyness, and shortness of breath for about a week. No appt has been scheduled. Reason for Disposition  [1] Systolic BP  >= 160 OR Diastolic >= 100 AND [2] cardiac (e.g., breathing difficulty, chest pain) or neurologic symptoms (e.g., new-onset blurred or double vision, unsteady gait)  [1] MODERATE difficulty breathing (e.g., speaks in phrases, SOB even at rest, pulse 100-120) AND [2] NEW-onset or WORSE than normal  Answer Assessment - Initial Assessment Questions RESPIRATORY STATUS: "Describe your breathing?" (e.g., wheezing, shortness of breath, unable to speak, severe coughing)      SOB, coughing, wheezing ONSET: "When did this breathing problem begin?"      One week ago PATTERN "Does the difficult breathing come and go, or has it been constant since it started?"      Comes and goes SEVERITY: "How bad is your breathing?" (e.g., mild, moderate, severe)    - MILD: No SOB at rest, mild SOB with walking, speaks normally in sentences, can lie down, no retractions, pulse < 100.    - MODERATE: SOB at rest, SOB with minimal exertion and prefers to sit, cannot lie down flat, speaks in phrases, mild retractions, audible wheezing, pulse 100-120.    - SEVERE: Very SOB at rest, speaks in  single words, struggling to breathe, sitting hunched forward, retractions, pulse > 120      Moderate  Protocols used: Breathing Difficulty-A-AH, Blood Pressure - High-A-AH

## 2023-10-02 NOTE — Telephone Encounter (Signed)
 NFN

## 2023-10-02 NOTE — Telephone Encounter (Signed)
 Dr. Darnelle Elders reviewed message. I placed an order for a CXR. also sent a prescription for augmentin  and prednisone . please call the patient.  Patient advised. NFN.

## 2023-10-02 NOTE — Addendum Note (Signed)
 Addended by: Jobe Mulder on: 10/02/2023 03:40 PM   Modules accepted: Orders

## 2023-11-07 ENCOUNTER — Other Ambulatory Visit: Payer: Self-pay | Admitting: Adult Health

## 2023-11-07 DIAGNOSIS — J441 Chronic obstructive pulmonary disease with (acute) exacerbation: Secondary | ICD-10-CM

## 2023-11-21 ENCOUNTER — Ambulatory Visit: Payer: Self-pay | Admitting: Student in an Organized Health Care Education/Training Program

## 2023-11-21 NOTE — Telephone Encounter (Signed)
 FYI Only or Action Required?: Action required by provider: request for appointment.  Patient is followed in Pulmonology for COPD, last seen on 02/20/2023 by Isadora Hose, MD.  Called Nurse Triage reporting Shortness of Breath.  Symptoms began a week ago.  Interventions attempted: Prescription medications: Prednisone , Rescue inhaler, Maintenance inhaler, and Increased fluids/rest.  Symptoms are: unchanged.  Triage Disposition: See HCP Within 4 Hours (Or PCP Triage)-patient is requesting an appointment-wants to speak to office  Patient/caregiver understands and will follow disposition?: No, wishes to speak with PCP  Copied from CRM [id number]. Topic: Clinical - Red Word Triage >> Nov 21, 2023  9:07 AM Corean SAUNDERS wrote: Red Word that prompted transfer to Nurse Triage: Shortness of breath and wheezing. Reason for Disposition  [1] Longstanding difficulty breathing (e.g., CHF, COPD, emphysema) AND [2] WORSE than normal  Answer Assessment - Initial Assessment Questions 1. RESPIRATORY STATUS: Describe your breathing? (e.g., wheezing, shortness of breath, unable to speak, severe coughing)      Shortness of breath, wheezing 2. ONSET: When did this breathing problem begin?      Started over a week ago. 3. PATTERN Does the difficult breathing come and go, or has it been constant since it started?      constant 4. SEVERITY: How bad is your breathing? (e.g., mild, moderate, severe)      moderate 5. RECURRENT SYMPTOM: Have you had difficulty breathing before? If Yes, ask: When was the last time? and What happened that time?      Yes-patient reports it has been a year since she has had these symptoms 6. CARDIAC HISTORY: Do you have any history of heart disease? (e.g., heart attack, angina, bypass surgery, angioplasty)      HTN 7. LUNG HISTORY: Do you have any history of lung disease?  (e.g., pulmonary embolus, asthma, emphysema)     COPD, asthma 8. CAUSE: What do you think is  causing the breathing problem?      unsure 9. OTHER SYMPTOMS: Do you have any other symptoms? (e.g., chest pain, cough, dizziness, fever, runny nose)     Cough-darker color sputum 10. O2 SATURATION MONITOR:  Do you use an oxygen saturation monitor (pulse oximeter) at home? If Yes, ask: What is your reading (oxygen level) today? What is your usual oxygen saturation reading? (e.g., 95%)       Patient doesn't have a oxygen saturation monitor at home. Patient given information about where she could find a oxygen saturation monitor.  12. TRAVEL: Have you traveled out of the country in the last month? (e.g., travel history, exposures)       No  Patient reports seeing her primary care doctor recently and was started on Prednisone  due to her symptoms.  Protocols used: Breathing Difficulty-A-AH

## 2023-11-22 ENCOUNTER — Encounter: Payer: Self-pay | Admitting: Advanced Practice Midwife

## 2023-11-22 NOTE — Telephone Encounter (Signed)
 Patient called-appointment made for Monday November 25, 2023 with Comer Rouleau NP for 9:00 AM. Patient verbalized understanding and all questions answered.

## 2023-11-22 NOTE — Telephone Encounter (Signed)
 Monique Edwards has several openings Monday 7/21- called the pt to get her scheduled and there was no answer- LMTCB. When pt calls back please get her scheduled and then close this encounter.

## 2023-11-25 ENCOUNTER — Ambulatory Visit: Admitting: Nurse Practitioner

## 2023-12-10 ENCOUNTER — Ambulatory Visit: Admitting: Nurse Practitioner

## 2023-12-17 ENCOUNTER — Ambulatory Visit: Admitting: Nurse Practitioner

## 2023-12-24 ENCOUNTER — Inpatient Hospital Stay: Admission: RE | Admit: 2023-12-24 | Source: Ambulatory Visit

## 2024-01-17 ENCOUNTER — Telehealth: Payer: Self-pay | Admitting: Physician Assistant

## 2024-01-17 ENCOUNTER — Ambulatory Visit

## 2024-01-17 NOTE — Telephone Encounter (Signed)
 Patient with history of alcoholic cirrhosis and hepatitis has appointment scheduled for this afternoon at urgent care.  I called to speak with patient to inquire more about her symptoms and how we would be able to help her.  Patient reports 1 to 2-week history of worsening right upper quadrant abdominal pain with radiation to the back and blood in stool.  She was last seen by GI over 6 months ago and cannot get into see another provider until December 1.  I discussed with patient that we do have access to lab work but I cannot accurately/adequately evaluate her liver with imaging and there is definitely concern for worsening cirrhosis and all the secondary problems that come with it based on her symptoms.  Patient states that she will proceed to the ER at this time for more immediate and comprehensive workup.  She was understanding and appreciative that I let her know of the limitations of urgent care and instructed her to proceed to a more proper location given her history.

## 2024-02-19 ENCOUNTER — Ambulatory Visit
Admission: RE | Admit: 2024-02-19 | Discharge: 2024-02-19 | Disposition: A | Discharge status: Home / Self Care | Source: Ambulatory Visit | Attending: Nurse Practitioner | Admitting: Nurse Practitioner

## 2024-02-19 DIAGNOSIS — Z1231 Encounter for screening mammogram for malignant neoplasm of breast: Secondary | ICD-10-CM | POA: Diagnosis present

## 2024-02-21 ENCOUNTER — Telehealth: Payer: Self-pay

## 2024-02-21 ENCOUNTER — Other Ambulatory Visit (HOSPITAL_COMMUNITY): Payer: Self-pay

## 2024-02-21 DIAGNOSIS — J441 Chronic obstructive pulmonary disease with (acute) exacerbation: Secondary | ICD-10-CM

## 2024-02-21 MED ORDER — ROFLUMILAST 250 MCG PO TABS: 1.0000 | ORAL_TABLET | Freq: Every day | ORAL | 3 refills | Status: DC

## 2024-02-21 NOTE — Telephone Encounter (Signed)
 I have notified the patient.   Also, I received a fax from [facility] to send her Roflumilast  to them.  The patient verified I could send the script [facility].  Script has been sent to [facility]  Nothing further needed.

## 2024-02-21 NOTE — Telephone Encounter (Signed)
 [employer] team can you look into this and see what a covered alternative would be. Thank you!

## 2024-02-21 NOTE — Telephone Encounter (Signed)
 Copied from CRM [id number]. Topic: Clinical - Prescription Issue >> Feb 21, 2024 10:53 AM Monique Edwards wrote: Reason for CRM: Patient is trying to get her medications switched to [facility] and was told that the Advair  was not covered, she is wondering why this could be or if theres something that can be done to help get this med covered.

## 2024-02-27 ENCOUNTER — Ambulatory Visit: Admitting: Student in an Organized Health Care Education/Training Program

## 2024-03-03 ENCOUNTER — Ambulatory Visit: Admitting: Student in an Organized Health Care Education/Training Program

## 2024-03-06 ENCOUNTER — Encounter: Payer: Self-pay | Admitting: Student in an Organized Health Care Education/Training Program

## 2024-03-06 ENCOUNTER — Ambulatory Visit: Payer: Self-pay | Admitting: Student in an Organized Health Care Education/Training Program

## 2024-03-06 DIAGNOSIS — J441 Chronic obstructive pulmonary disease with (acute) exacerbation: Secondary | ICD-10-CM

## 2024-03-06 MED ORDER — ROFLUMILAST 250 MCG PO TABS: 1.0000 | ORAL_TABLET | Freq: Every day | ORAL | 0 refills | Status: AC

## 2024-03-06 NOTE — Telephone Encounter (Signed)
 FYI Only or Action Required?: Action required by provider: update on patient condition and patient wants a refill of COPD medication (tablets) sent to [facility] on [address]---she believes it is Roflumilast  that was sent to [facility] but will take a while to get--been out for a week.  Patient is followed in Pulmonology for chronic bronchitis, COPD, last seen on 02/20/2023 by Isadora Hose, MD.  Called Nurse Triage reporting Shortness of Breath.  Symptoms began yesterday.  Interventions attempted: Rescue inhaler and Maintenance inhaler.  Symptoms are: gradually worsening.  Triage Disposition: See HCP Within 4 Hours (Or PCP Triage)  Patient/caregiver understands and will follow disposition?: No                    Copied from CRM #[id number]. Topic: Clinical - Red Word Triage >> Mar 06, 2024 12:26 PM Corean SAUNDERS wrote: Red Word that prompted transfer to Nurse Triage: Shortness of breath, has been without COPD medication for a week. Reason for Disposition  [1] Longstanding difficulty breathing (e.g., CHF, COPD, emphysema) AND [2] WORSE than normal  Answer Assessment - Initial Assessment Questions Started yesterday Sore throat Coughing alot Wheezing Shortness of breath Patient has used her inhalers at home with no relief  Patient states she switched to [facility] and she has been out of her medicine--out for a week Patient states that she needs the medication refilled that is a tablet that helps prevent COPD flare ups.  She believes it is the Roflumilast  prescription and she states she would need this sent to the [facility] on [address]  Patient sounds unwell and has to take breaks to cough and catch her breath while on the phone with this RN Patient is advised that it would be recommended that she be seen and evaluated today for her symptoms Patient states that she does not have transportation at this time She states that she will call  [facility] because they have the number to an ambulance service that will come check her out at home.  This RN asked if it was 911 because I could help her with that and she said it was different and she would call them. This RN advised her to call us  back at any point and also if anything gets worse to call 911 Patient verbalized understanding.    [facility] Pulmonary Triage - Initial Assessment Questions Chief Complaint (e.g., cough, sob, wheezing, fever, chills, sweat or additional symptoms) *Go to specific symptom protocol after initial questions. Shortness of breath,   How long have symptoms been present?    Started yesterday  Have you tested for COVID or Flu? Note: If not, ask patient if a home test can be taken. If so, instruct patient to call back for positive results. No  MEDICINES:   Have you used any OTC meds to help with symptoms? No  OXYGEN: Do you wear supplemental oxygen? No If yes, How many liters are you supposed to use? N/a  Do you monitor your oxygen levels? No If yes, What is your reading (oxygen level) today? N/a  What is your usual oxygen saturation reading?  (Note: Pulmonary O2 sats should be 90% or greater) -----    3. PATTERN Does the difficult breathing come and go, or has it been constant since it started?      ---- 4. SEVERITY: How bad is your breathing? (e.g., mild, moderate, severe)      --- 5. RECURRENT SYMPTOM: Have you had difficulty breathing before? If  Yes, ask: When was the last time? and What happened that time?      --- 6. CARDIAC HISTORY: Do you have any history of heart disease? (e.g., heart attack, angina, bypass surgery, angioplasty)      --- 7. LUNG HISTORY: Do you have any history of lung disease?  (e.g., pulmonary embolus, asthma, emphysema)     COPD 8. CAUSE: What do you think is causing the breathing problem?      ---- 9. OTHER SYMPTOMS: Do you have any other symptoms? (e.g., chest pain,  cough, dizziness, fever, runny nose)     ------  Protocols used: Breathing Difficulty-A-AH

## 2024-03-06 NOTE — Telephone Encounter (Signed)
 FYI 30 day supply of Roflumilast  sent to [facility], as per patient request. Patient reports using Advair  and Incruse Ellipta  daily, and Albuterol  every 4 hours.  She reports breathing is worse. Advised patient to head to urgent care or ED if symptoms are worsening.

## 2024-03-06 NOTE — Telephone Encounter (Signed)
 Answer Assessment - Initial Assessment Questions 1. REASON FOR CALL: What is the main reason for your call? or How can I best help you?     Patient has fatty liver. Daughter calling to check to see if patient can take Dayquil generic. Advised that though her liver function numbers looked good back in February, it is best to stay away from tylenol  if able to. Advised Mucinex  as an alternative.   Aware of Roflumilast  script called in. They do no have transportation at this time but will a little later this afternoon and will pick it up then. Patient aware that if any worsening, go to ED or UC. Advised if concerned over the weekend and no transport- to call EMS to evaluate. Understood. No further questions at this time.  Protocols used: Information Only Call - No Triage-A-AH

## 2024-03-10 NOTE — Telephone Encounter (Signed)
 Noted. Appt scheduled, 04/06/2024. NFN.

## 2024-04-06 ENCOUNTER — Ambulatory Visit: Admitting: Student in an Organized Health Care Education/Training Program

## 2024-05-14 ENCOUNTER — Ambulatory Visit: Admitting: Student in an Organized Health Care Education/Training Program

## 2024-05-18 ENCOUNTER — Ambulatory Visit: Admitting: Student in an Organized Health Care Education/Training Program

## 2024-06-08 ENCOUNTER — Ambulatory Visit: Admitting: Student in an Organized Health Care Education/Training Program

## 2024-06-15 ENCOUNTER — Ambulatory Visit: Admitting: Student in an Organized Health Care Education/Training Program
